# Patient Record
Sex: Female | Born: 1960 | Race: Black or African American | Hispanic: No | Marital: Single | Smoking: Current some day smoker
Health system: Southern US, Community
[De-identification: ages and names within clinical notes are randomized; demographics above are authoritative.]

## PROBLEM LIST (undated history)

## (undated) DIAGNOSIS — K5900 Constipation, unspecified: Secondary | ICD-10-CM

## (undated) DIAGNOSIS — R112 Nausea with vomiting, unspecified: Secondary | ICD-10-CM

## (undated) DIAGNOSIS — E1110 Type 2 diabetes mellitus with ketoacidosis without coma: Secondary | ICD-10-CM

## (undated) DIAGNOSIS — G9332 Myalgic encephalomyelitis/chronic fatigue syndrome: Secondary | ICD-10-CM

## (undated) DIAGNOSIS — M545 Low back pain, unspecified: Secondary | ICD-10-CM

## (undated) DIAGNOSIS — J189 Pneumonia, unspecified organism: Secondary | ICD-10-CM

## (undated) DIAGNOSIS — R519 Headache, unspecified: Secondary | ICD-10-CM

## (undated) DIAGNOSIS — J45909 Unspecified asthma, uncomplicated: Secondary | ICD-10-CM

## (undated) DIAGNOSIS — F419 Anxiety disorder, unspecified: Secondary | ICD-10-CM

## (undated) DIAGNOSIS — D219 Benign neoplasm of connective and other soft tissue, unspecified: Secondary | ICD-10-CM

## (undated) DIAGNOSIS — Z9889 Other specified postprocedural states: Secondary | ICD-10-CM

## (undated) DIAGNOSIS — N289 Disorder of kidney and ureter, unspecified: Secondary | ICD-10-CM

## (undated) DIAGNOSIS — K219 Gastro-esophageal reflux disease without esophagitis: Secondary | ICD-10-CM

## (undated) DIAGNOSIS — R102 Pelvic and perineal pain unspecified side: Secondary | ICD-10-CM

## (undated) DIAGNOSIS — I10 Essential (primary) hypertension: Secondary | ICD-10-CM

## (undated) DIAGNOSIS — M797 Fibromyalgia: Secondary | ICD-10-CM

## (undated) DIAGNOSIS — M549 Dorsalgia, unspecified: Secondary | ICD-10-CM

## (undated) DIAGNOSIS — K589 Irritable bowel syndrome without diarrhea: Secondary | ICD-10-CM

## (undated) DIAGNOSIS — M069 Rheumatoid arthritis, unspecified: Secondary | ICD-10-CM

## (undated) DIAGNOSIS — N183 Chronic kidney disease, stage 3 unspecified: Secondary | ICD-10-CM

## (undated) DIAGNOSIS — E739 Lactose intolerance, unspecified: Secondary | ICD-10-CM

## (undated) DIAGNOSIS — E119 Type 2 diabetes mellitus without complications: Secondary | ICD-10-CM

## (undated) DIAGNOSIS — G8929 Other chronic pain: Secondary | ICD-10-CM

## (undated) DIAGNOSIS — R51 Headache: Secondary | ICD-10-CM

## (undated) HISTORY — DX: Lactose intolerance, unspecified: E73.9

## (undated) HISTORY — DX: Type 2 diabetes mellitus without complications: E11.9

## (undated) HISTORY — PX: BREAST BIOPSY: SHX20

## (undated) HISTORY — DX: Pelvic and perineal pain unspecified side: R10.20

## (undated) HISTORY — DX: Rheumatoid arthritis, unspecified: M06.9

## (undated) HISTORY — DX: Constipation, unspecified: K59.00

## (undated) HISTORY — DX: Dorsalgia, unspecified: M54.9

## (undated) HISTORY — DX: Myalgic encephalomyelitis/chronic fatigue syndrome: G93.32

## (undated) HISTORY — DX: Irritable bowel syndrome, unspecified: K58.9

## (undated) HISTORY — DX: Benign neoplasm of connective and other soft tissue, unspecified: D21.9

## (undated) HISTORY — PX: BREAST EXCISIONAL BIOPSY: SUR124

## (undated) HISTORY — DX: Pelvic and perineal pain: R10.2

---

## 1998-10-06 ENCOUNTER — Ambulatory Visit (HOSPITAL_COMMUNITY): Admission: RE | Admit: 1998-10-06 | Discharge: 1998-10-06 | Payer: Self-pay | Admitting: Internal Medicine

## 1999-08-12 ENCOUNTER — Emergency Department (HOSPITAL_COMMUNITY): Admission: EM | Admit: 1999-08-12 | Discharge: 1999-08-12 | Payer: Self-pay | Admitting: Emergency Medicine

## 2000-03-29 ENCOUNTER — Other Ambulatory Visit: Admission: RE | Admit: 2000-03-29 | Discharge: 2000-03-29 | Payer: Self-pay | Admitting: Obstetrics & Gynecology

## 2001-10-05 ENCOUNTER — Other Ambulatory Visit: Admission: RE | Admit: 2001-10-05 | Discharge: 2001-10-05 | Payer: Self-pay | Admitting: Obstetrics and Gynecology

## 2001-10-09 ENCOUNTER — Encounter: Admission: RE | Admit: 2001-10-09 | Discharge: 2001-10-09 | Payer: Self-pay | Admitting: Obstetrics and Gynecology

## 2001-10-09 ENCOUNTER — Encounter: Payer: Self-pay | Admitting: Obstetrics and Gynecology

## 2001-11-14 ENCOUNTER — Ambulatory Visit (HOSPITAL_COMMUNITY): Admission: RE | Admit: 2001-11-14 | Discharge: 2001-11-14 | Payer: Self-pay | Admitting: Obstetrics & Gynecology

## 2001-11-14 ENCOUNTER — Encounter: Payer: Self-pay | Admitting: Obstetrics and Gynecology

## 2002-07-25 ENCOUNTER — Inpatient Hospital Stay (HOSPITAL_COMMUNITY): Admission: AD | Admit: 2002-07-25 | Discharge: 2002-07-27 | Payer: Self-pay | Admitting: Internal Medicine

## 2002-07-26 ENCOUNTER — Encounter: Payer: Self-pay | Admitting: Internal Medicine

## 2002-07-26 ENCOUNTER — Encounter (INDEPENDENT_AMBULATORY_CARE_PROVIDER_SITE_OTHER): Payer: Self-pay | Admitting: Cardiology

## 2002-07-27 ENCOUNTER — Encounter: Payer: Self-pay | Admitting: Internal Medicine

## 2003-04-25 ENCOUNTER — Other Ambulatory Visit: Admission: RE | Admit: 2003-04-25 | Discharge: 2003-04-25 | Payer: Self-pay | Admitting: Obstetrics and Gynecology

## 2003-05-09 ENCOUNTER — Encounter: Payer: Self-pay | Admitting: Obstetrics and Gynecology

## 2003-05-09 ENCOUNTER — Encounter: Admission: RE | Admit: 2003-05-09 | Discharge: 2003-05-09 | Payer: Self-pay | Admitting: Obstetrics and Gynecology

## 2004-05-10 ENCOUNTER — Other Ambulatory Visit: Admission: RE | Admit: 2004-05-10 | Discharge: 2004-05-10 | Payer: Self-pay | Admitting: Obstetrics and Gynecology

## 2004-05-21 ENCOUNTER — Encounter: Admission: RE | Admit: 2004-05-21 | Discharge: 2004-05-21 | Payer: Self-pay | Admitting: Obstetrics and Gynecology

## 2004-06-11 ENCOUNTER — Ambulatory Visit (HOSPITAL_COMMUNITY): Admission: RE | Admit: 2004-06-11 | Discharge: 2004-06-11 | Payer: Self-pay | Admitting: Cardiology

## 2005-06-08 ENCOUNTER — Other Ambulatory Visit: Admission: RE | Admit: 2005-06-08 | Discharge: 2005-06-08 | Payer: Self-pay | Admitting: Obstetrics and Gynecology

## 2005-06-24 ENCOUNTER — Encounter: Admission: RE | Admit: 2005-06-24 | Discharge: 2005-06-24 | Payer: Self-pay | Admitting: Obstetrics and Gynecology

## 2006-06-23 ENCOUNTER — Other Ambulatory Visit: Admission: RE | Admit: 2006-06-23 | Discharge: 2006-06-23 | Payer: Self-pay | Admitting: Obstetrics and Gynecology

## 2006-07-07 ENCOUNTER — Encounter: Admission: RE | Admit: 2006-07-07 | Discharge: 2006-07-07 | Payer: Self-pay | Admitting: Obstetrics and Gynecology

## 2006-07-18 ENCOUNTER — Encounter: Admission: RE | Admit: 2006-07-18 | Discharge: 2006-07-18 | Payer: Self-pay | Admitting: Obstetrics and Gynecology

## 2006-07-18 ENCOUNTER — Encounter (INDEPENDENT_AMBULATORY_CARE_PROVIDER_SITE_OTHER): Payer: Self-pay | Admitting: Specialist

## 2006-12-26 HISTORY — PX: DILATION AND CURETTAGE OF UTERUS: SHX78

## 2007-01-18 ENCOUNTER — Encounter: Admission: RE | Admit: 2007-01-18 | Discharge: 2007-01-18 | Payer: Self-pay | Admitting: Obstetrics and Gynecology

## 2007-03-29 ENCOUNTER — Encounter: Admission: RE | Admit: 2007-03-29 | Discharge: 2007-03-29 | Payer: Self-pay | Admitting: Rheumatology

## 2007-07-20 ENCOUNTER — Encounter: Admission: RE | Admit: 2007-07-20 | Discharge: 2007-07-20 | Payer: Self-pay | Admitting: Obstetrics and Gynecology

## 2007-09-28 ENCOUNTER — Encounter (INDEPENDENT_AMBULATORY_CARE_PROVIDER_SITE_OTHER): Payer: Self-pay | Admitting: Obstetrics and Gynecology

## 2007-09-28 ENCOUNTER — Ambulatory Visit (HOSPITAL_COMMUNITY): Admission: RE | Admit: 2007-09-28 | Discharge: 2007-09-28 | Payer: Self-pay | Admitting: Obstetrics and Gynecology

## 2008-07-25 ENCOUNTER — Encounter: Admission: RE | Admit: 2008-07-25 | Discharge: 2008-07-25 | Payer: Self-pay | Admitting: Obstetrics and Gynecology

## 2008-09-12 ENCOUNTER — Emergency Department (HOSPITAL_COMMUNITY): Admission: EM | Admit: 2008-09-12 | Discharge: 2008-09-12 | Payer: Self-pay | Admitting: Emergency Medicine

## 2009-07-31 ENCOUNTER — Encounter: Admission: RE | Admit: 2009-07-31 | Discharge: 2009-07-31 | Payer: Self-pay | Admitting: Obstetrics and Gynecology

## 2009-09-11 ENCOUNTER — Encounter: Admission: RE | Admit: 2009-09-11 | Discharge: 2009-09-11 | Payer: Self-pay | Admitting: Internal Medicine

## 2010-08-13 ENCOUNTER — Encounter: Admission: RE | Admit: 2010-08-13 | Discharge: 2010-08-13 | Payer: Self-pay | Admitting: Obstetrics and Gynecology

## 2011-05-10 NOTE — Discharge Summary (Signed)
NAME:  Maria [name], Maria [name]            ACCOUNT NO.:  [id number]   MEDICAL RECORD NO.:  [id number]          PATIENT TYPE:  AMB   LOCATION:  SDC                           FACILITY:  [facility]   PHYSICIAN:  Naima A. Dillard, M.D. DATE OF BIRTH:  Mar 29, 1961   DATE OF ADMISSION:  DATE OF DISCHARGE:                               DISCHARGE SUMMARY   CHIEF COMPLAINT:  Menometrorrhagia.   Patient is a 50 year old African-American female who presented back in  June, 2008 complaining of heavy irregular cycles.  Patient states that  she had bleeding for about 7-8 days using a pad about every hour or hour  and a half.  Urine pregnancy test, GC/Chlamydia was negative.  Patient  had an ultrasound which showed her uterus to be 9 weeks size with three  fibroids, subserosal on the left, 3 cm in size.  This later resolved in  August, and then the patient had a period in August which stayed on  continuously in which she had bleeding in which she soaked two pads a  day.  We did try to stop the bleeding with Provera and was helpful.  The  patient continued to bleed and started to have bloating and weight gain  from it.  Then put her on an OCP pill type of taper.  This did stop the  bleeding.  The patient wants a D&C, hysteroscopy, possible resection.  All treatments for fibroids were reviewed with the patient.  The patient  has decided on this.  She does want to retain her fertility if able.  She was seen by a fertility who says she would need an egg donor if she  would want IUP.  As per the patient, declined that, but she does not  want to remain on birth control and does want a D&C.   Patient wants no known drug allergies.   PAST MEDICAL HISTORY:  Significant for anxiety, asthma, acid reflux,  hypertension.   Medications include fluconazole, alprazolam, Flovent, omeprazole,  Cartia, triamterene, and vitamins.   PAST SURGICAL HISTORY:  Unremarkable.   SOCIAL HISTORY:  Significant for smoking a cigarette a  day.  Occasional  alcohol.  No illicit drug use.   PHYSICAL EXAMINATION:  Patient's blood pressure is 100/80.  Weight is  166 pounds.  Pupils are equal.  Hearing is normal.  Thyroid is not enlarged.  HEART:  Regular rate and rhythm.  LUNGS:  Clear to auscultation bilaterally.  BREASTS:  No masses, discharge, skin change, or nipple retraction  bilaterally.  BACK:  No CVA tenderness.  ABDOMEN:  Nontender without any masses or organomegaly.  EXTREMITIES:  No clubbing, cyanosis or edema.  Full vaginal exam is within normal limits.  Cervix is nontender without  any lesions.  Uterus is normal in shape, size, and consistency and  nontender.  Adnexa has no masses.   Urine pregnancy test is negative.  Hemoglobin is found to be 13.8.   ASSESSMENT:  Fibroids, menometrorrhagia.  All treatments were reviewed  for fibroids, which includes but not limited to observation, birth  control, D&C, myomectomy, Lupron, and hysterectomy.  Patient desires to  obtain her fertility and with a D&C hysteroscopy.  She understands the  risk of but not limited to bleeding, infection, perforation of the  uterus, Asherman's syndrome, which could lead to infertility.  Patient  agrees and wants to proceed.      Naima A. Normand Sloop, M.D.  Electronically Signed     NAD/MEDQ  D:  09/27/2007  T:  09/27/2007  Job:  [id number]

## 2011-05-13 NOTE — H&P (Signed)
NAME:  Maria [name], Maria [name]                      ACCOUNT NO.:  [id number]   MEDICAL RECORD NO.:  [id number]                   PATIENT TYPE:  INP   LOCATION:  3728                                 FACILITY:  [facility]   PHYSICIAN:  Eric L. August Saucer, M.D.                  DATE OF BIRTH:  20-Jun-1961   DATE OF ADMISSION:  07/25/2002  DATE OF DISCHARGE:  07/27/2002                                HISTORY & PHYSICAL   CHIEF COMPLAINT:  Increasing shortness of breath and cardiac arrhythmia.   HISTORY OF PRESENT ILLNESS:  This is the first recent [facility]  admission for this 50 year old single black female with [name] long history of  hypertension, asthma, and allergic rhinitis.  For the past week the patient  has had problems with increasing shortness of breath with intermittent  asthma.  She had had [name] nonproductive cough as well.  No associated chest  pain.  She recently was started on [name] prednisone dosepak after not responding  to her bronchodilators and antibiotics.  Last night she developed problems  with rapid heart rate.  States this was preceded by transient sharp chest  pain.  She also experienced some heartburn during the nighttime as well.  The patient was seen in our office today and noted to have [name] sinus  tachycardia with occasional ectopic beats.  EKG demonstrated [name] sinus  tachycardia without other abnormalities.  The patient was advised to stop  Advair, which she had already taken.  Her albuterol inhaler was held as  well.  She was given [name] low-dose Toprol XL.  She, however, had no improvement  of  her tachycardia.  She experienced transient heaviness in the chest as  well.  She was subsequently admitted for evaluation and observation.  She  has continued to have intermittent cough, presently nonproductive.  Denies  fever or chills.  Occasional night sweats.   FAMILY HISTORY:  Remarkable for hypertension and heart disease in her  mother, who has presently passed.   SOCIAL HISTORY:   The patient's habits are remarkable for smoking two to  three cigarettes [name] day.  Occasional alcohol on weekends.  She is single.  She does work in [name] [employer].  This has exacerbated her  asthma.   PAST SURGICAL HISTORY:  Patient with no previous surgical history.   REVIEW OF SYSTEMS:  As noted above.  She does acknowledge occasional  heartburn.  She has had some postnasal drainage.  Admits to [name] cough that  does wake her up during the night.   ALLERGIES:  The patient has no medication allergies.  She does have eczema  with periodic exacerbation.   MEDICATIONS:  Zyrtec 10 mg q.d., Maxzide 25 mg q.d., Cardizem LA 180 mg p.o.  q.d., Levaquin 500 mg p.o. q.d., Advair 100/50 one puff b.i.d. (it is  recorded that she was taking two puffs b.i.d.), prednisone dosepak which is  presently  held, Proventil two puffs q.i.d. p.r.n.  On review, she had taken  Proventil up to six times per day.   PHYSICAL EXAMINATION:  GENERAL:  She is [name] well-developed, well-nourished,  anxious black female in no acute distress.   VITAL SIGNS:  Blood pressure of 138/84, temperature 97.8, pulse of 94-100,  respiratory rate 20.   HEENT:  Head:  Normocephalic, atraumatic.  She has [name] left frontal sinus  tenderness.  No maxillary sinus tenderness.  Nose:  Mild left turbinate  edema versus right.  Oropharynx clear.  The TMs with decreased light reflex,  without erythematous changes.   NECK:  No palpable cervical nodes.   CHEST:  Lungs with slightly diminished breath sounds but no audible wheezes  or rales.  No E to [name] changes are appreciated except for minimal change in  the right base.   CARDIAC:  She has [name] rapid rhythm.  Occasional ectopic beat noted.  Normal  S1, S2.  No S3, murmurs, or rubs appreciated.   ABDOMEN:  Bowel sounds present.  No enlarged liver or spleen, masses, or  tenderness.   MUSCULOSKELETAL:  She has [name] small bruise on the left inner thigh, nontender.  Minimal crepitus in the  knees.  Negative Homans.  No edema.   NEUROLOGIC:  Alert and oriented x3.  Cranial nerves were intact.  Cerebellar, sensory, and motor function were intact.   LABORATORY DATA:  EKG notable for sinus rhythm, presently with [name] rate of 86.  Normal axis.  Poor R-wave progression.  No acute changes.  She had voltage  criteria for left ventricular hypertrophy.  Chest x-ray pending.   CBC revealed WBC of 17,300, hemoglobin 12.9, hematocrit 39.1, adequate  platelets.  Polys 83.  Chemistry:  Sodium 134, potassium 3.6, chloride 105,  CO2 21, BUN 18, creatinine 1.2, glucose elevated at 151.  Albumin 3.6, total  protein 7.2.  CK is 125, MB 1.3, troponin 0.01.   IMPRESSION:  1. Acute asthma with persistent shortness of breath and atypical cough.  2. Cardiac arrhythmia with transient sinus tachycardia, quite symptomatic.  3. Anxiety disorder associated with [name] drug, rule out other.  4. Hypertension.  5. Allergic rhinitis.  6. Rule out reflux with associated cough.   PLAN:  The patient is admitted for observation on telemetry at this time.  We will control rate with Cardizem and, if necessary, low-dose beta blocker.  This has not been demonstrated to exacerbate her asthma.  Will obtain [name] 2 D  echo to evaluate LV function and rule out mitral valve prolapse versus  other.  Monitor her telemetry during the night.  Further therapy pending  results of the above.  Will also check [name] thyroid panel at this time.  Will  hold albuterol and any other stimulant agent, i.e., Serevent.  Will place  her on Atrovent nebulizer q.i.d.  Will continue AeroBid inhaler and hold  Advair at this time.  Further evaluation in 24 hours.  Cardiologic  evaluation pending the results of the above.  She will also need [name] barium  swallow, which can be done as an outpatient.                                                Eric L. August Saucer, M.D.    ELD/MEDQ  D:  07/25/2002  T:  07/31/2002  Job:  [id number]

## 2011-05-13 NOTE — Discharge Summary (Signed)
NAME:  Maria [name], Maria [name] NO.:  [id number]   MEDICAL RECORD NO.:  [id number]                    PATIENT TYPE:   LOCATION:                                       FACILITY:   PHYSICIAN:  Eric L. August Saucer, M.D.                  DATE OF BIRTH:  [birth year]   DATE OF ADMISSION:  07/25/2002  DATE OF DISCHARGE:  07/27/2002                                 DISCHARGE SUMMARY   FINAL DIAGNOSES:  1. Tachycardia (785.0).  2. Adverse effect of medication (E947.9).  3. Asthma without status asthmaticus (493.90).  4. Hypertension (401.9).  5. 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  6. 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  7. Anxiety state (300.00).   OPERATIONS AND PROCEDURES:  None.   HISTORY OF PRESENT ILLNESS:  This is the first recent [place] admission  for this 50 year old single black female with a long history of  hypertension, asthma, and allergic rhinitis.  For the past week, the patient  had had problems with increasing shortness of breath with intermittent  asthma.  She had a nonproductive cough as well without associated chest  pain.  She has recently started on prednisone Dosepak after not responding  to her bronchodilators and antibiotics.  One night prior to admission, she  experienced a rapid heart rate.  This was proceeded by a transient sharp  chest pain.  She also experienced some heartburn during the nighttime as  well.  The patient was subsequently seen in the office the following day and  noted to have a sinus tachycardia with occasional ectopic beats.  EKG  demonstrated a sinus tachycardia without other abnormalities.  She was  advised to stop the Advair, which she had already taken.  Her albuterol was  held as well.  The patient was given low-dose Toprol.  Despite this,  however, her symptoms did not improve and she subsequently experienced some  transient heaviness in her chest.  The patient was  admitted for further  evaluation and therapy.   PAST MEDICAL HISTORY:  As per admission H&P.   PHYSICAL EXAMINATION:  As per admission H&P.   HOSPITAL COURSE:  The patient was admitted for further evaluation of acute  asthma with associated tachycardia.  She was noted at time of presentation  to have a normal CK-MB.  Blood sugar was mildly elevated at 151.  The  patient was placed on telemetry.  She started IV fluids.  She was started on  Cardizem for control of her heart rate without exacerbation of her asthma.  The patient's bronchodilators were held including the albuterol and Serevent  which she had been taking, as it was felt this was aggravating her  tachycardia.  She was placed on Atrovent nebulizer q.i.d.  She was continued  on AeroBid inhaler for steroid adjuvant.   Over the subsequent 24 hours, the patient's heart rate  did gradually  subside.  She had no significant palpations on the subsequent day.  Her  lungs had improved considerably as well.  Notably, a CRP was 0.3.  HDL  cholesterol was measured at 75 and total cholesterol was 207.  She was  continued on her medication thereafter.  She was seen in consultation by Dr.  Algie Coffer and Dr. Sharyn Lull for cardiology clearance.  It was felt that she was  indeed experiencing atypical chest pain.  She subsequently underwent a  Cardiolite study to exclude underlying coronary artery disease.  This was  subsequently found to be negative.  She had an ejection fraction of  approximately 66%.   The patient also clearly during her hospital stay had significant anxiety  disorder.  This was discussed as well with appropriate medication given.  She also had signs of a maxillary sinusitis and this was treated with  antibiotics as well.  By July 27, 2002, she was feeling considerably better  and felt to be stable for discharge.   DISCHARGE MEDICATIONS:  1. Cardizem LA 180 mg q.d.  2. AeroBid 2 puffs b.i.d.  3. Levaquin 500 mg q.d. for  seven days.  4. Atrovent metered dose inhaler 2 puffs q.i.d.  5. Zyrtec 10 mg q.d.  6. Protonix 40 mg q.a.m.  7. _________ 25 mg q.d.  8. Muco-Fen-DM 1 p.o. b.i.d.   DISCHARGE INSTRUCTIONS:  She needs to avoid smoking and dust.  No added salt  or sweets to her diet.  Follow up in the office in two weeks' time.                                                  Eric L. August Saucer, M.D.    ELD/MEDQ  D:  10/09/2002  T:  10/11/2002  Job:  [id number]

## 2011-05-13 NOTE — Discharge Summary (Signed)
NAME:  Maria Maria [name], Maria Maria [name]                      ACCOUNT NO.:  [id number]   MEDICAL RECORD NO.:  [id number]                   PATIENT TYPE:  INP   LOCATION:  3728                                 FACILITY:  [facility]   PHYSICIAN:  Eric L. August Saucer, M.D.                  DATE OF BIRTH:  17-Aug-1961   DATE OF ADMISSION:  07/25/2002  DATE OF DISCHARGE:  07/27/2002                                 DISCHARGE SUMMARY   FINAL DIAGNOSES:  1. Tachycardia, 785.0  2. Adverse effect of medication, E947.9  3. Asthma without status asthmaticus, 493.90  4. Hypertension, 401.9.  5. Glucose intolerance, 271.3.  6. Chronic maxillary sinusitis, 473.0.  7. Anxiety disorder.   OPERATION/PROCEDURE:  None.   HISTORY OF PRESENT ILLNESS:  This is the first recent [facility]  admission for this 50 year old single black female with Maria [name] long history of  hypertension, asthma and allergic rhinitis. For the past week, the patient  had had problems with increasing shortness of breath with intermittent flare  of his asthma. She had had Maria [name] nonproductive cough as well without associated  chest pain. The patient was recently started Maria [name] prednisone dose pack after  not responding to her bronchodilators and Advair as well as antibiotics. The  night prior to admission, she developed rapid heart rate. This was proceeded  by Maria [name] transient sharp chest pain. She did experience some heartburn during  this episode as well. The patient  was seen in the office subsequently and  was noted to have Maria [name] sinus tachycardia with occasional ectopic beats. This  was confirmed by EKG. She subsequently was advised to stop Advair which she  had already taken for that morning. The albuterol inhaler was held as well.  The patient was started on low dose Toprol. Despite this, however, she had  no improvement of her tachycardia. She subsequently experienced strain and  heaviness in her chest. The patient was admitted for further evaluation and  stabilization thereafter.   Past medical history and physical exam is per admission H&P.   HOSPITAL COURSE:  The patient was admitted for further evaluation and  treatment of her tachycardia as well as her asthma. She was placed on  telemetry initially. Cardiac enzymes and thyroid function test were obtained  which were within normal limits. Her tachycardia was controlled acutely with  Cardizem. She was placed on Atrovent nebulizer therapy to treat her asthma  which did not cause any poor arrhythmic effect. Over the subsequent day, the  patient  felt considerably better. She was seen in consultation by Dr.  Sharyn Lull of cardiology. In view of her substernal pressure pain and family  history, it was felt that Maria [name] stress Cardiolite would be most appropriate to  exclude coronary artery disease. This was subsequently performed which she  tolerated well. This subsequently was found to be negative for ischemia with  good ejection fraction of 60%. Note, the other  laboratory data obtained  during this time was significant for hemoglobin A1c being 7. She was  subsequently advised on dietary measures as well.  By 07/27/02, the patient  was feeling considerably better. She was felt to be stable for discharge.  Healthy habits were advised.   MEDICATIONS AT TIME OF DISCHARGE::  1. Cardizem LA 180 mg q.d.  2. AeroBid two puffs b.i.d.  3. Tequin 400 mg q.d. for sinusitis.  4. Atrovent MDI two puffs q.i.d.  5. Zyrtec 10 mg q.d.  6. Protonix 40 mg q.Maria [name].m.  7. Maxzide 25 mg q.d.  8. Muco-Fen-DM 600 mg one p.o. b.i.d.   She was advised to avoid her obvious irritating factors, i.e., smoke and  dust. No added salt or sweets to her diet. Follow up in two weeks time.                                               Eric L. August Saucer, M.D.    ELD/MEDQ  D:  09/11/2002  T:  09/14/2002  Job:  [id number]

## 2011-05-13 NOTE — Op Note (Signed)
NAME:  Paredez, Takeria            ACCOUNT NO.:  [id number]   MEDICAL RECORD NO.:  [id number]          PATIENT TYPE:  AMB   LOCATION:  SDC                           FACILITY:  [facility]   PHYSICIAN:  Naima A. Dillard, M.D. DATE OF BIRTH:  1961/06/20   DATE OF PROCEDURE:  10/25/2007  DATE OF DISCHARGE:                               OPERATIVE REPORT   PREOPERATIVE DIAGNOSIS:  Menometrorrhagia.   POSTOPERATIVE DIAGNOSIS:  Menometrorrhagia with endometrial polyp.   PROCEDURE:  1. Dilatation and curettage.  2. Hysteroscopy.  3. Polypectomy.   SURGEON:  Naima A. Dillard, M.D.   ASSISTANT:  There were no assistants.   ANESTHESIA:  General and local.   SPECIMENS:  Small polyp near the patient's right ostium.  There were no  submucosal fibroids noted, but the endometrium appeared to be atrophic,  no other masses except for the small polyp.  Endometrial curettings and  endometrial polyp were sent to Pathology.   ESTIMATED BLOOD LOSS:  Minimal.   COMPLICATIONS:  There were no complications.   DISPOSITION:  The patient went to recovery room in stable condition.   PROCEDURE IN DETAIL:  The patient was taken to the operating room, where  she was the placed in the dorsal lithotomy position and prepped and  draped in a normal sterile fashion and given general anesthesia and her  bladder was drained with a straight catheter.  A bivalve speculum was  placed into the vagina.  The anterior lip of the cervix was grasped with  a single-tooth tenaculum.  The uterus did sound to 8 cm.  The cervix was  infiltrated with 1% lidocaine, 20 mL, for a cervical block.  The cervix  was then dilated with [facility] dilators.  A hysteroscope was placed into  the uterine cavity; both ostia were visualized.  There was atrophic  endometrium.  The endocervix was normal.  The fundus was normal.  There  were no submucosal fibroids.  Just right outside the right ostia was a  small polyp which was removed with the polyp  forceps with the  hysteroscope.  Sharp curettage was done and endometrial  curettings were obtained and sent to Pathology.  All instruments were  removed from the vagina.  The tenaculum was removed from the cervix with  hemostasis.  Sponge, lap and needle counts were correct.  The patient  went to recovery room in stable condition.      Naima A. Normand Sloop, M.D.  Electronically Signed     NAD/MEDQ  D:  09/28/2007  T:  09/29/2007  Job:  [id number]

## 2011-08-04 ENCOUNTER — Other Ambulatory Visit: Payer: Self-pay | Admitting: Obstetrics and Gynecology

## 2011-08-04 ENCOUNTER — Emergency Department (HOSPITAL_COMMUNITY)
Admission: EM | Admit: 2011-08-04 | Discharge: 2011-08-04 | Disposition: A | Discharge status: Home / Self Care | Payer: BC Managed Care – PPO | Attending: Emergency Medicine | Admitting: Emergency Medicine

## 2011-08-04 ENCOUNTER — Emergency Department (HOSPITAL_COMMUNITY): Payer: BC Managed Care – PPO

## 2011-08-04 DIAGNOSIS — I10 Essential (primary) hypertension: Secondary | ICD-10-CM | POA: Insufficient documentation

## 2011-08-04 DIAGNOSIS — R1032 Left lower quadrant pain: Secondary | ICD-10-CM | POA: Insufficient documentation

## 2011-08-04 DIAGNOSIS — Z1231 Encounter for screening mammogram for malignant neoplasm of breast: Secondary | ICD-10-CM

## 2011-08-04 DIAGNOSIS — D259 Leiomyoma of uterus, unspecified: Secondary | ICD-10-CM | POA: Insufficient documentation

## 2011-08-04 LAB — URINALYSIS, ROUTINE W REFLEX MICROSCOPIC
Bilirubin Urine: NEGATIVE
Hgb urine dipstick: NEGATIVE
Ketones, ur: NEGATIVE mg/dL
Leukocytes, UA: NEGATIVE
Nitrite: NEGATIVE
Protein, ur: NEGATIVE mg/dL
Specific Gravity, Urine: 1.01 (ref 1.005–1.030)
pH: 5.5 (ref 5.0–8.0)

## 2011-08-04 LAB — CBC
Hemoglobin: 14 g/dL (ref 12.0–15.0)
MCH: 31.7 pg (ref 26.0–34.0)
MCHC: 34 g/dL (ref 30.0–36.0)
MCV: 93.4 fL (ref 78.0–100.0)
Platelets: 373 10*3/uL (ref 150–400)
RDW: 14.1 % (ref 11.5–15.5)

## 2011-08-04 LAB — COMPREHENSIVE METABOLIC PANEL
BUN: 18 mg/dL (ref 6–23)
CO2: 22 mEq/L (ref 19–32)
Glucose, Bld: 85 mg/dL (ref 70–99)
Potassium: 4.2 mEq/L (ref 3.5–5.1)
Total Bilirubin: 0.1 mg/dL — ABNORMAL LOW (ref 0.3–1.2)
Total Protein: 7.9 g/dL (ref 6.0–8.3)

## 2011-08-04 LAB — DIFFERENTIAL
Lymphocytes Relative: 25 % (ref 12–46)
Lymphs Abs: 3.1 10*3/uL (ref 0.7–4.0)

## 2011-08-04 LAB — PREGNANCY, URINE: Preg Test, Ur: NEGATIVE

## 2011-08-04 MED ORDER — IOHEXOL 300 MG/ML  SOLN
100.0000 mL | Freq: Once | INTRAMUSCULAR | Status: AC | PRN
  Administered 2011-08-04: 100 mL via INTRAVENOUS

## 2011-08-15 ENCOUNTER — Ambulatory Visit
Admission: RE | Admit: 2011-08-15 | Discharge: 2011-08-15 | Disposition: A | Discharge status: Home / Self Care | Payer: BC Managed Care – PPO | Source: Ambulatory Visit | Attending: Obstetrics and Gynecology | Admitting: Obstetrics and Gynecology

## 2011-08-15 DIAGNOSIS — Z1231 Encounter for screening mammogram for malignant neoplasm of breast: Secondary | ICD-10-CM

## 2011-08-25 ENCOUNTER — Other Ambulatory Visit: Payer: Self-pay | Admitting: Obstetrics and Gynecology

## 2011-08-25 DIAGNOSIS — D259 Leiomyoma of uterus, unspecified: Secondary | ICD-10-CM

## 2011-09-01 ENCOUNTER — Other Ambulatory Visit: Payer: Self-pay | Admitting: Internal Medicine

## 2011-09-01 DIAGNOSIS — R748 Abnormal levels of other serum enzymes: Secondary | ICD-10-CM

## 2011-09-02 ENCOUNTER — Other Ambulatory Visit: Payer: BC Managed Care – PPO

## 2011-09-05 ENCOUNTER — Ambulatory Visit
Admission: RE | Admit: 2011-09-05 | Discharge: 2011-09-05 | Disposition: A | Discharge status: Home / Self Care | Payer: BC Managed Care – PPO | Source: Ambulatory Visit | Attending: Internal Medicine | Admitting: Internal Medicine

## 2011-09-05 DIAGNOSIS — R748 Abnormal levels of other serum enzymes: Secondary | ICD-10-CM

## 2011-09-26 LAB — POCT CARDIAC MARKERS
CKMB, poc: 3.1
CKMB, poc: <1 — ABNORMAL LOW
Myoglobin, poc: 69.1

## 2011-09-26 LAB — DIFFERENTIAL
Basophils Absolute: 0.1
Basophils Relative: 1
Eosinophils Absolute: 0.2
Eosinophils Relative: 2
Lymphs Abs: 3.9
Neutrophils Relative %: 59

## 2011-09-26 LAB — POCT I-STAT, CHEM 8
BUN: 12
Calcium, Ion: 0.96 — ABNORMAL LOW
HCT: 40
Hemoglobin: 13.6
Sodium: 130 — ABNORMAL LOW
TCO2: 21

## 2011-09-26 LAB — CBC
HCT: 38
MCV: 93.8
Platelets: 409 — ABNORMAL HIGH
RDW: 14.6
WBC: 11.5 — ABNORMAL HIGH

## 2011-09-26 LAB — D-DIMER, QUANTITATIVE: D-Dimer, Quant: <0.22

## 2011-10-06 LAB — BASIC METABOLIC PANEL
CO2: 22
Calcium: 9.1
Chloride: 103
Creatinine, Ser: 1.25 — ABNORMAL HIGH
GFR calc Af Amer: 56 — ABNORMAL LOW
Glucose, Bld: 157 — ABNORMAL HIGH

## 2011-10-06 LAB — CBC
HCT: 39.8
Hemoglobin: 13.8
MCHC: 34.6
MCV: 92.9
RBC: 4.28
RDW: 13.7

## 2011-11-07 ENCOUNTER — Other Ambulatory Visit: Payer: Self-pay | Admitting: Internal Medicine

## 2011-11-07 ENCOUNTER — Ambulatory Visit
Admission: RE | Admit: 2011-11-07 | Discharge: 2011-11-07 | Disposition: A | Discharge status: Home / Self Care | Payer: BC Managed Care – PPO | Source: Ambulatory Visit | Attending: Internal Medicine | Admitting: Internal Medicine

## 2011-11-07 DIAGNOSIS — T1490XA Injury, unspecified, initial encounter: Secondary | ICD-10-CM

## 2011-11-23 ENCOUNTER — Other Ambulatory Visit (INDEPENDENT_AMBULATORY_CARE_PROVIDER_SITE_OTHER): Payer: Self-pay | Admitting: Otolaryngology

## 2011-11-23 DIAGNOSIS — J320 Chronic maxillary sinusitis: Secondary | ICD-10-CM

## 2012-01-02 IMAGING — US US RENAL
1 series · 14 of 25 positions shown · non-contrast
Comparison: CT scan 08/04/2011.

CLINICAL DATA: Elevated creatinine.

RENAL/URINARY TRACT ULTRASOUND COMPLETE

[Series 1: us renal · 0.26mm/px · 14 of 44 slices shown]
[im 1/44]
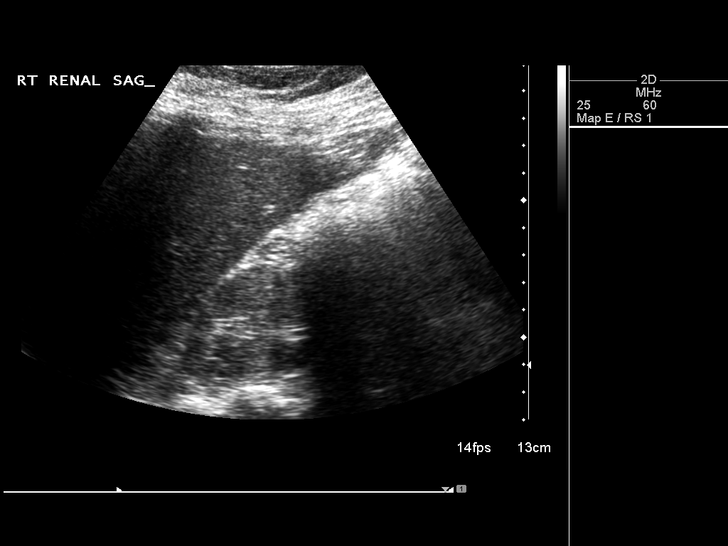
[im 4/44]
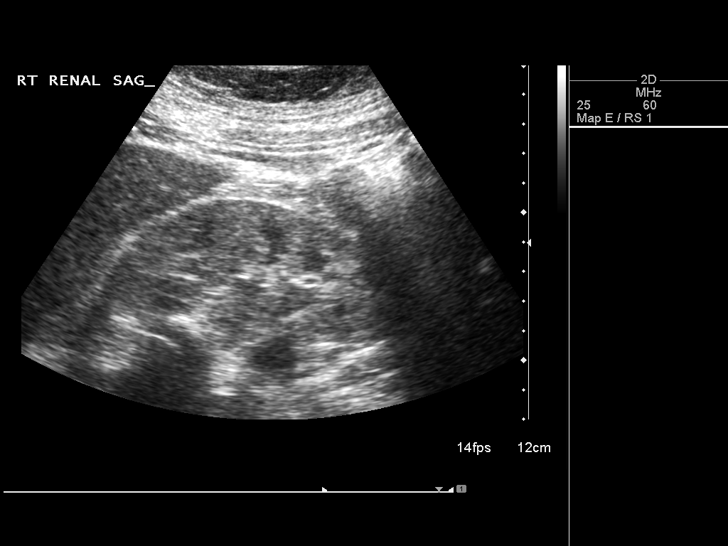
[im 8/44]
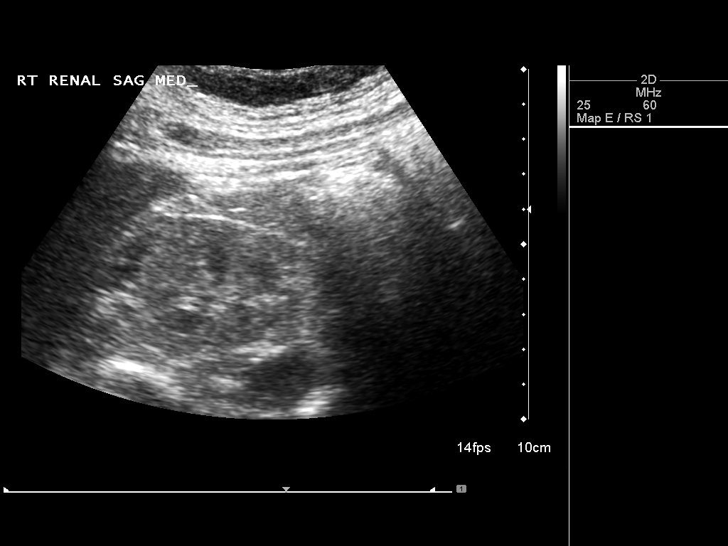
[im 11/44]
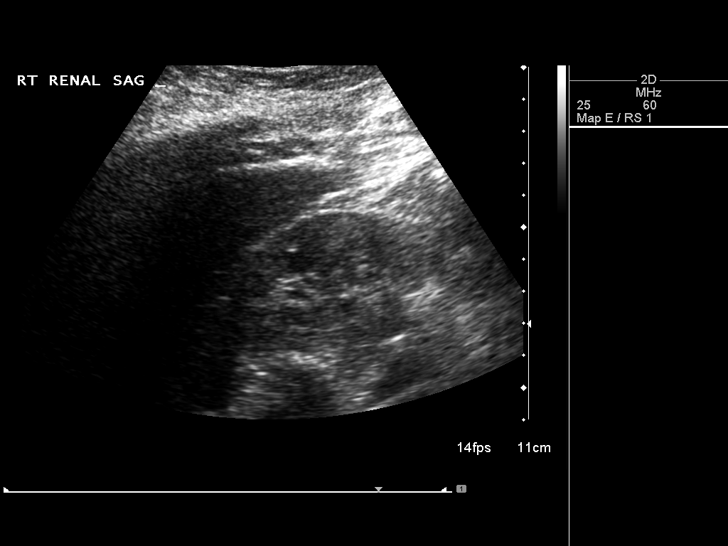
[im 15/44]
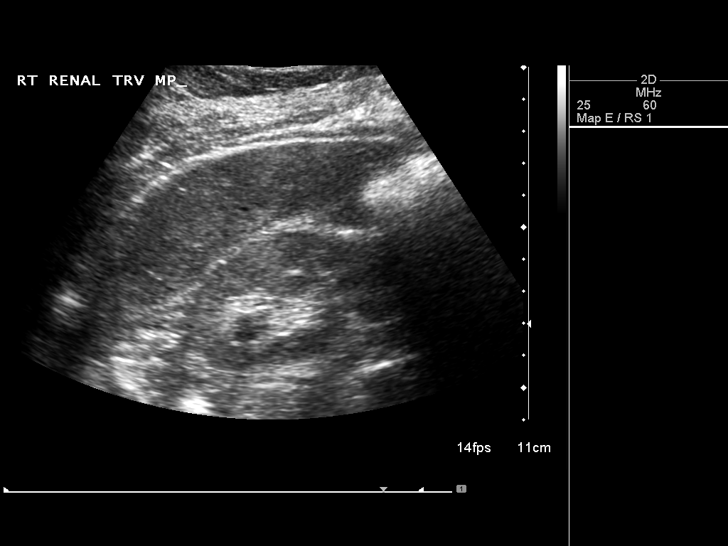
[im 17/44]
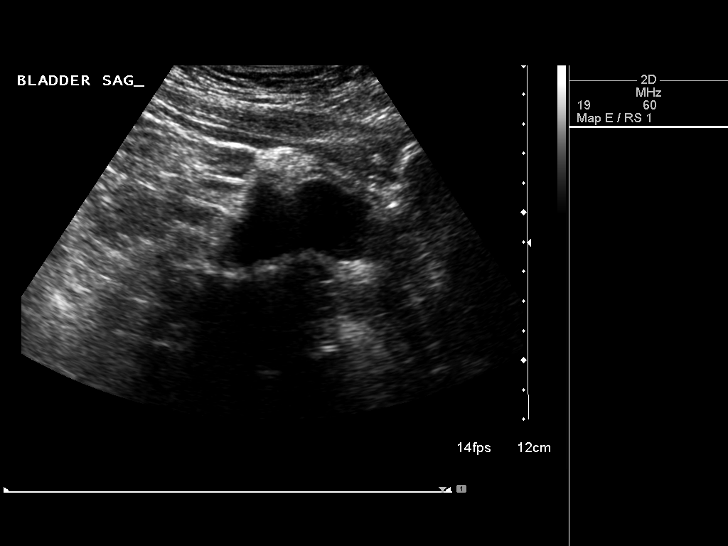
[im 20/44]
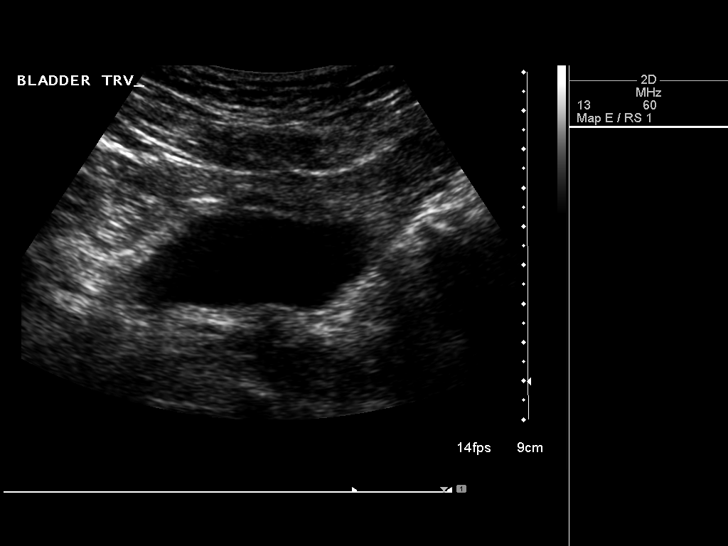
[im 24/44]
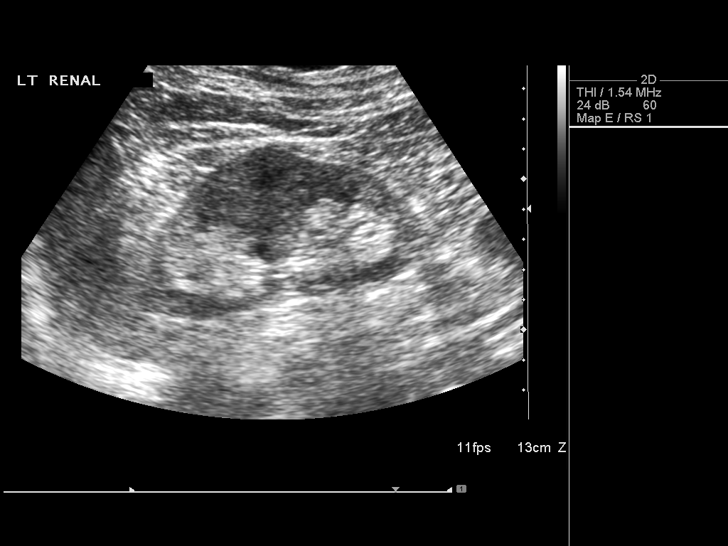
[im 27/44]
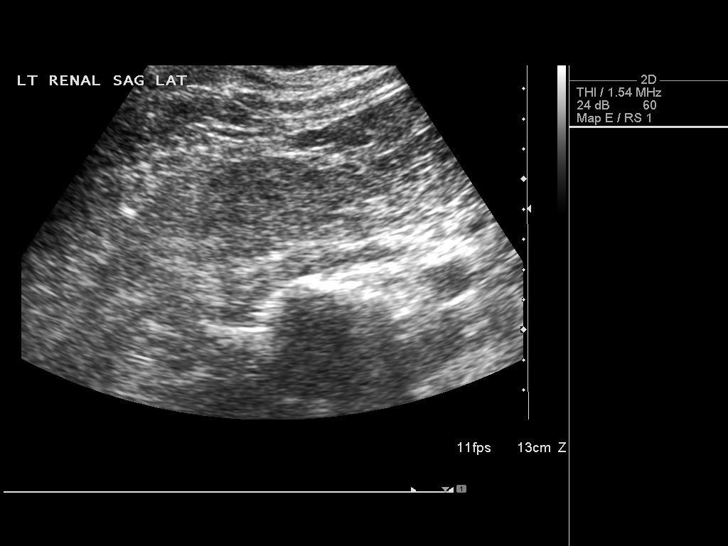
[im 29/44]
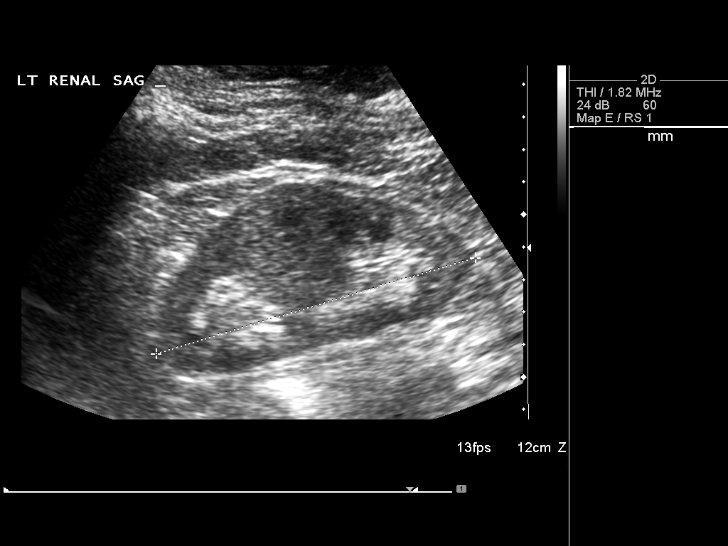
[im 33/44]
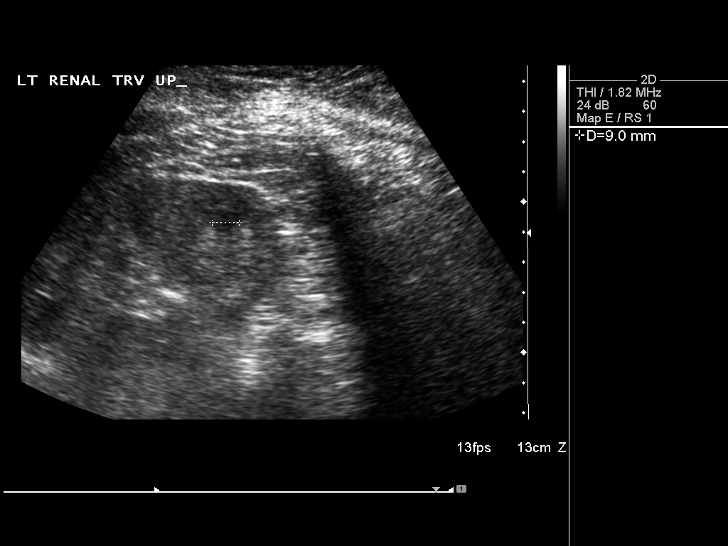
[im 36/44]
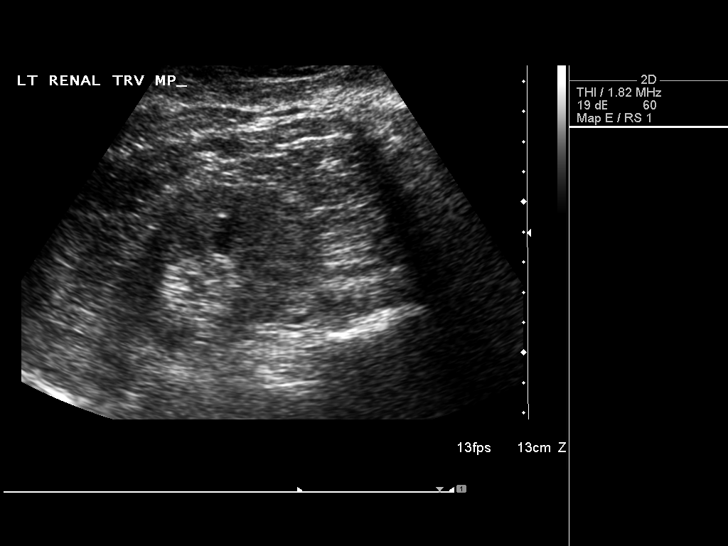
[im 40/44]
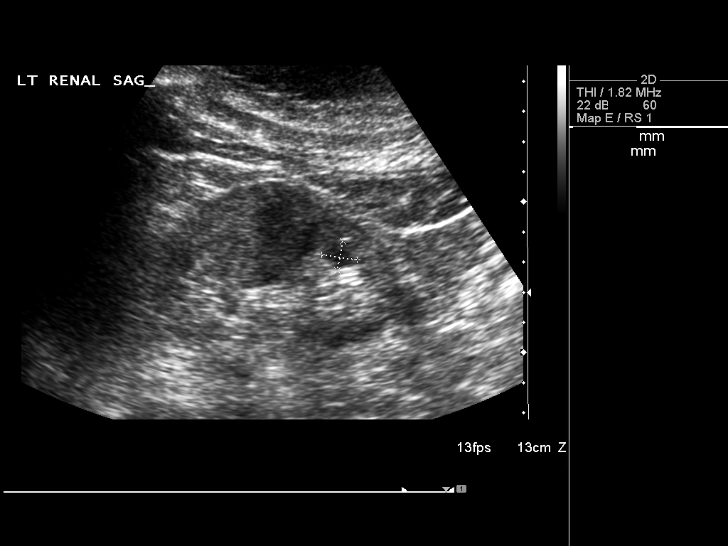
[im 44/44]
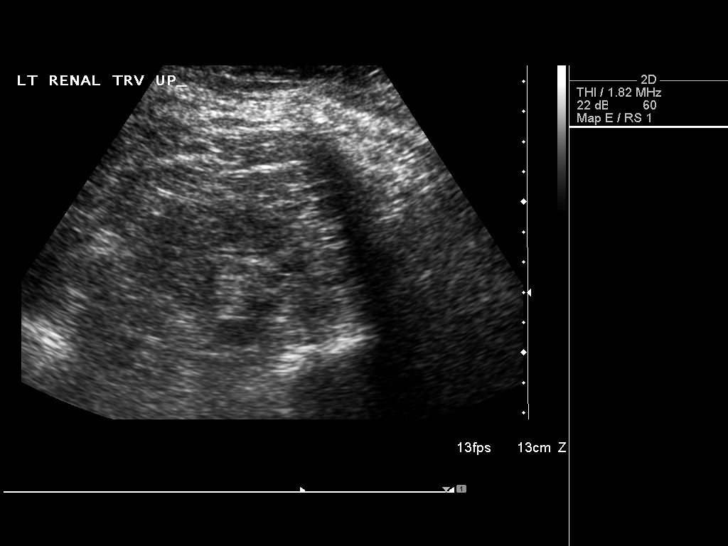

[14 of 25 positions shown; findings below may reference images not displayed]

FINDINGS: Right Kidney:  10.1 cm in length.  Normal renal cortical thickness.
Mild diffuse increased echogenicity suggesting medical renal
disease.  A 1 cm upper pole cyst is noted.  No hydronephrosis.

Left Kidney:  10.3 cm in length.  Increased echogenicity suggesting
medical renal disease.  Normal renal cortical thickness.  Multiple
small cysts are noted.  No hydronephrosis.

Bladder:  Normal
IMPRESSION: 1.  Increased echogenicity of both kidneys suggesting medical renal
disease.
2.  No hydronephrosis.
3.  Small bilateral renal cysts.

## 2012-02-21 ENCOUNTER — Other Ambulatory Visit: Payer: Self-pay | Admitting: Nephrology

## 2012-02-21 DIAGNOSIS — I10 Essential (primary) hypertension: Secondary | ICD-10-CM

## 2012-02-27 ENCOUNTER — Ambulatory Visit
Admission: RE | Admit: 2012-02-27 | Discharge: 2012-02-27 | Disposition: A | Discharge status: Home / Self Care | Payer: BC Managed Care – PPO | Source: Ambulatory Visit | Attending: Nephrology | Admitting: Nephrology

## 2012-02-27 DIAGNOSIS — I10 Essential (primary) hypertension: Secondary | ICD-10-CM

## 2012-06-26 ENCOUNTER — Other Ambulatory Visit: Payer: Self-pay | Admitting: Obstetrics and Gynecology

## 2012-06-26 DIAGNOSIS — Z1231 Encounter for screening mammogram for malignant neoplasm of breast: Secondary | ICD-10-CM

## 2012-08-07 ENCOUNTER — Encounter: Payer: Self-pay | Admitting: Obstetrics and Gynecology

## 2012-08-07 ENCOUNTER — Ambulatory Visit (INDEPENDENT_AMBULATORY_CARE_PROVIDER_SITE_OTHER): Payer: BC Managed Care – PPO | Admitting: Obstetrics and Gynecology

## 2012-08-07 VITALS — BP 136/88 | HR 84 | Ht 65.0 in | Wt 156.0 lb

## 2012-08-07 DIAGNOSIS — Z124 Encounter for screening for malignant neoplasm of cervix: Secondary | ICD-10-CM

## 2012-08-07 DIAGNOSIS — B009 Herpesviral infection, unspecified: Secondary | ICD-10-CM

## 2012-08-07 DIAGNOSIS — D259 Leiomyoma of uterus, unspecified: Secondary | ICD-10-CM

## 2012-08-07 DIAGNOSIS — N289 Disorder of kidney and ureter, unspecified: Secondary | ICD-10-CM

## 2012-08-07 DIAGNOSIS — D219 Benign neoplasm of connective and other soft tissue, unspecified: Secondary | ICD-10-CM | POA: Insufficient documentation

## 2012-08-07 MED ORDER — NORETHIN ACE-ETH ESTRAD-FE 1-20 MG-MCG PO TABS: 1.0000 | ORAL_TABLET | Freq: Every day | ORAL | Status: DC

## 2012-08-07 MED ORDER — VALACYCLOVIR HCL 500 MG PO TABS: 500.0000 mg | ORAL_TABLET | Freq: Two times a day (BID) | ORAL | Status: AC | PRN

## 2012-08-07 NOTE — Progress Notes (Signed)
Last Pap: 07/25/11 WNL: Yes Regular Periods:yes Contraception: pill-junel  Monthly Breast exam:no Tetanus<19yrs:no Nl.Bladder Function:yes Daily BMs:no Healthy Diet:yes Calcium:yes Mammogram:yes Date of Mammogram: 08/15/11 Exercise:no Have often Exercise: n/a Seatbelt: yes Abuse at home: no Stressful work:no Sigmoid-colonoscopy: n/a BP 136/88  Pulse 84  Ht 5\' 5"  (1.651 m)  Wt 156 lb (70.761 kg)  BMI 25.96 kg/m2  LMP 07/16/2012  Bone Density: No PCP: Dr. Minerva Areola L. Dean  Change in PMH: Kidney problems due to hypertension Change in [facility]: none Pt without complaints Physical Examination: General appearance - alert, well appearing, and in no distress Mental status - normal mood, behavior, speech, dress, motor activity, and thought processes Neck - supple, no significant adenopathy, thyroid exam: thyroid is normal in size without nodules or tenderness Chest - clear to auscultation, no wheezes, rales or rhonchi, symmetric air entry Heart - normal rate and regular rhythm Abdomen - soft, nontender, nondistended, no masses or organomegaly Breasts - breasts appear normal, no suspicious masses, no skin or nipple changes or axillary nodes Pelvic - normal external genitalia, vulva, vagina, cervix, uterus 12 week sixe and irregular and adnexa Rectal - normal rectal, no masses, rectal exam not indicated Back exam - full range of motion, no tenderness, palpable spasm or pain on motion Neurological - alert, oriented, normal speech, no focal findings or movement disorder noted Musculoskeletal - no joint tenderness, deformity or swelling Extremities - no edema, redness or tenderness in the calves or thighs Skin - normal coloration and turgor, no rashes, no suspicious skin lesions noted Routine exam Fibroids pt desires observation Herpes pt on valtrex prn Pap sent yes Mammogram due yes pt scheduled for this month pt diagnosed with renal insufficiency from HTN.  she is under the care of a  nephrologist RT 1 yr

## 2012-08-07 NOTE — Patient Instructions (Signed)
Fibroids You have been diagnosed as having a fibroid. Fibroids are smooth muscle lumps (tumors) which can occur any place in a woman's body. They are usually in the womb (uterus). The most common problem (symptom) of fibroids is bleeding. Over time this may cause low red blood cells (anemia). Other symptoms include feelings of pressure and pain in the pelvis. The diagnosis (learning what is wrong) of fibroids is made by physical exam. Sometimes tests such as an ultrasound are used. This is helpful when fibroids are felt around the ovaries and to look for tumors. TREATMENT   Most fibroids do not need surgical or medical treatment. Sometimes a tissue sample (biopsy) of the lining of the uterus is done to rule out cancer. If there is no cancer and only a small amount of bleeding, the problem can be watched.   Hormonal treatment can improve the problem.   When surgery is needed, it can consist of removing the fibroid. Vaginal birth may not be possible after the removal of fibroids. This depends on where they are and the extent of surgery. When pregnancy occurs with fibroids it is usually normal.   Your caregiver can help decide which treatments are best for you.  HOME CARE INSTRUCTIONS   Do not use aspirin as this may increase bleeding problems.   If your periods (menses) are heavy, record the number of pads or tampons used per month. Bring this information to your caregiver. This can help them determine the best treatment for you.  SEEK IMMEDIATE MEDICAL CARE IF:  You have pelvic pain or cramps not controlled with medications, or experience a sudden increase in pain.   You have an increase of pelvic bleeding between and during menses.   You feel lightheaded or have fainting spells.   You develop worsening belly (abdominal) pain.  Document Released: 12/09/2000 Document Revised: 12/01/2011 Document Reviewed: 07/31/2008 Exit[employer] Patient Information 2012 [employer].    

## 2012-08-08 LAB — PAP IG W/ RFLX HPV ASCU

## 2012-08-15 ENCOUNTER — Ambulatory Visit
Admission: RE | Admit: 2012-08-15 | Discharge: 2012-08-15 | Disposition: A | Discharge status: Home / Self Care | Payer: BC Managed Care – PPO | Source: Ambulatory Visit | Attending: Obstetrics and Gynecology | Admitting: Obstetrics and Gynecology

## 2012-08-15 DIAGNOSIS — Z1231 Encounter for screening mammogram for malignant neoplasm of breast: Secondary | ICD-10-CM

## 2012-08-29 ENCOUNTER — Encounter: Payer: Self-pay | Admitting: Obstetrics and Gynecology

## 2013-03-22 ENCOUNTER — Ambulatory Visit (HOSPITAL_BASED_OUTPATIENT_CLINIC_OR_DEPARTMENT_OTHER): Payer: BC Managed Care – PPO | Attending: Internal Medicine | Admitting: Radiology

## 2013-03-22 VITALS — Ht 66.0 in | Wt 165.0 lb

## 2013-03-22 DIAGNOSIS — I10 Essential (primary) hypertension: Secondary | ICD-10-CM | POA: Insufficient documentation

## 2013-03-22 DIAGNOSIS — R0683 Snoring: Secondary | ICD-10-CM

## 2013-03-22 DIAGNOSIS — R5381 Other malaise: Secondary | ICD-10-CM | POA: Insufficient documentation

## 2013-03-22 DIAGNOSIS — R0989 Other specified symptoms and signs involving the circulatory and respiratory systems: Secondary | ICD-10-CM | POA: Insufficient documentation

## 2013-03-22 DIAGNOSIS — G4730 Sleep apnea, unspecified: Secondary | ICD-10-CM | POA: Insufficient documentation

## 2013-03-22 DIAGNOSIS — Z79899 Other long term (current) drug therapy: Secondary | ICD-10-CM | POA: Insufficient documentation

## 2013-03-22 DIAGNOSIS — R0681 Apnea, not elsewhere classified: Secondary | ICD-10-CM

## 2013-03-22 DIAGNOSIS — R51 Headache: Secondary | ICD-10-CM | POA: Insufficient documentation

## 2013-03-22 DIAGNOSIS — R0609 Other forms of dyspnea: Secondary | ICD-10-CM | POA: Insufficient documentation

## 2013-03-23 DIAGNOSIS — R0609 Other forms of dyspnea: Secondary | ICD-10-CM

## 2013-03-23 DIAGNOSIS — G4730 Sleep apnea, unspecified: Secondary | ICD-10-CM

## 2013-03-23 DIAGNOSIS — R0989 Other specified symptoms and signs involving the circulatory and respiratory systems: Secondary | ICD-10-CM

## 2013-04-08 NOTE — Procedures (Signed)
NAME:  Maria [name], Maria [name]            ACCOUNT NO.:  [id number]  MEDICAL RECORD NO.:  [id number]         PATIENT TYPE:  OUT  LOCATION:  SLEEP CENTER                 FACILITY:  [facility]  PHYSICIAN:  Clinton D. Maple Hudson, MD, FCCP, FACPDATE OF BIRTH:  DATE OF STUDY:  03/22/2013                           NOCTURNAL POLYSOMNOGRAM  REFERRING PHYSICIAN:  Eric L. August Saucer, M.D.  INDICATION FOR STUDY:  Hypersomnia with sleep apnea.  EPWORTH SLEEPINESS SCORE:  7/24.  BMI 26.6, weight 165 pounds, height 66 inches, neck 13 inches.  MEDICATIONS:  Home medications are charted and reviewed.  SLEEP ARCHITECTURE:  Total sleep time 310 minutes with sleep efficiency 75.7%.  Stage I was 10.5%, stage II 64.7%, stage III absent, REM 24.8% of total sleep time.  Sleep latency 68.5 minutes, REM latency 91 minutes, awake after sleep onset 30.5 minutes.  Arousal index 13.5.  BEDTIME MEDICATION:  Singulair, estazolam, alprazolam, carisoprodol.  RESPIRATORY DATA:  Apnea-hypopnea index (AHI) 0 per hour.  No events met scoring criteria for respiratory disturbance.  OXYGEN DATA:  Mild-to-moderate snoring with oxygen desaturation to a nadir of 93% and mean oxygen saturation through the study of 95.9% on room air.  CARDIAC DATA:  Normal sinus rhythm.  MOVEMENT-PARASOMNIA:  No movement disturbance.  Bathroom x1.  IMPRESSIONS-RECOMMENDATIONS: 1. Sleep architecture showed somewhat more frequent fragmentation by     spontaneous awakenings than average with sleep onset at 11:30 p.m.     Bedtime medications were listed as above, noting both estazolam and     alprazolam. 2. No respiratory disturbance on the study night.  AHI 0 per hour.     Mild-to-moderate snoring with oxygen desaturation to a nadir of 93%     and mean oxygen saturation through the study of 95.9% on room air.     Clinton D. Maple Hudson, MD, [facility], FACP Diplomate, American Board of Sleep Medicine    CDY/MEDQ  D:  03/23/2013 12:40:58  T:  03/23/2013 23:18:49   Job:  [id number]

## 2013-05-03 ENCOUNTER — Encounter: Payer: Self-pay | Admitting: Internal Medicine

## 2013-05-26 DIAGNOSIS — J189 Pneumonia, unspecified organism: Secondary | ICD-10-CM

## 2013-05-26 HISTORY — DX: Pneumonia, unspecified organism: J18.9

## 2013-05-29 ENCOUNTER — Ambulatory Visit
Admission: RE | Admit: 2013-05-29 | Discharge: 2013-05-29 | Disposition: A | Discharge status: Home / Self Care | Payer: BC Managed Care – PPO | Source: Ambulatory Visit | Attending: Internal Medicine | Admitting: Internal Medicine

## 2013-05-29 ENCOUNTER — Other Ambulatory Visit: Payer: Self-pay | Admitting: Internal Medicine

## 2013-05-29 DIAGNOSIS — R05 Cough: Secondary | ICD-10-CM

## 2013-05-31 ENCOUNTER — Emergency Department (HOSPITAL_COMMUNITY)
Admission: EM | Admit: 2013-05-31 | Discharge: 2013-05-31 | Disposition: A | Discharge status: Home / Self Care | Payer: BC Managed Care – PPO | Attending: Emergency Medicine | Admitting: Emergency Medicine

## 2013-05-31 ENCOUNTER — Encounter (HOSPITAL_COMMUNITY): Payer: Self-pay

## 2013-05-31 DIAGNOSIS — F17200 Nicotine dependence, unspecified, uncomplicated: Secondary | ICD-10-CM | POA: Insufficient documentation

## 2013-05-31 DIAGNOSIS — J189 Pneumonia, unspecified organism: Secondary | ICD-10-CM | POA: Insufficient documentation

## 2013-05-31 HISTORY — DX: Fibromyalgia: M79.7

## 2013-05-31 HISTORY — DX: Anxiety disorder, unspecified: F41.9

## 2013-05-31 NOTE — ED Notes (Addendum)
Patient reports that she was diagnosed with pneumonia 2 days ago and was prescribed the Z pack. Patient states that she was not feeling any better and called her PCP and was instructed to come to the ED for further treatment, evaluation and admission to the hospital. Patient has a nono productive cough

## 2013-06-03 ENCOUNTER — Encounter (HOSPITAL_COMMUNITY): Payer: Self-pay

## 2013-06-03 ENCOUNTER — Observation Stay (HOSPITAL_COMMUNITY)
Admission: AD | Admit: 2013-06-03 | Discharge: 2013-06-04 | Disposition: A | Discharge status: Home / Self Care | Payer: BC Managed Care – PPO | Source: Ambulatory Visit | Attending: Internal Medicine | Admitting: Internal Medicine

## 2013-06-03 ENCOUNTER — Inpatient Hospital Stay (HOSPITAL_COMMUNITY): Payer: BC Managed Care – PPO

## 2013-06-03 DIAGNOSIS — K219 Gastro-esophageal reflux disease without esophagitis: Secondary | ICD-10-CM | POA: Diagnosis present

## 2013-06-03 DIAGNOSIS — R490 Dysphonia: Secondary | ICD-10-CM | POA: Insufficient documentation

## 2013-06-03 DIAGNOSIS — M797 Fibromyalgia: Secondary | ICD-10-CM | POA: Diagnosis present

## 2013-06-03 DIAGNOSIS — D259 Leiomyoma of uterus, unspecified: Secondary | ICD-10-CM | POA: Insufficient documentation

## 2013-06-03 DIAGNOSIS — R143 Flatulence: Secondary | ICD-10-CM | POA: Insufficient documentation

## 2013-06-03 DIAGNOSIS — I152 Hypertension secondary to endocrine disorders: Secondary | ICD-10-CM | POA: Diagnosis present

## 2013-06-03 DIAGNOSIS — N289 Disorder of kidney and ureter, unspecified: Secondary | ICD-10-CM

## 2013-06-03 DIAGNOSIS — J189 Pneumonia, unspecified organism: Principal | ICD-10-CM | POA: Diagnosis present

## 2013-06-03 DIAGNOSIS — IMO0001 Reserved for inherently not codable concepts without codable children: Secondary | ICD-10-CM | POA: Insufficient documentation

## 2013-06-03 DIAGNOSIS — K589 Irritable bowel syndrome without diarrhea: Secondary | ICD-10-CM | POA: Insufficient documentation

## 2013-06-03 DIAGNOSIS — I10 Essential (primary) hypertension: Secondary | ICD-10-CM | POA: Insufficient documentation

## 2013-06-03 DIAGNOSIS — B009 Herpesviral infection, unspecified: Secondary | ICD-10-CM

## 2013-06-03 DIAGNOSIS — R141 Gas pain: Secondary | ICD-10-CM | POA: Insufficient documentation

## 2013-06-03 DIAGNOSIS — J45909 Unspecified asthma, uncomplicated: Secondary | ICD-10-CM | POA: Insufficient documentation

## 2013-06-03 DIAGNOSIS — R142 Eructation: Secondary | ICD-10-CM | POA: Insufficient documentation

## 2013-06-03 DIAGNOSIS — D219 Benign neoplasm of connective and other soft tissue, unspecified: Secondary | ICD-10-CM

## 2013-06-03 HISTORY — DX: Unspecified asthma, uncomplicated: J45.909

## 2013-06-03 HISTORY — DX: Essential (primary) hypertension: I10

## 2013-06-03 HISTORY — DX: Other specified postprocedural states: Z98.890

## 2013-06-03 HISTORY — DX: Nausea with vomiting, unspecified: R11.2

## 2013-06-03 LAB — COMPREHENSIVE METABOLIC PANEL
Albumin: 3.8 g/dL (ref 3.5–5.2)
Alkaline Phosphatase: 66 U/L (ref 39–117)
BUN: 17 mg/dL (ref 6–23)
Creatinine, Ser: 1.2 mg/dL — ABNORMAL HIGH (ref 0.50–1.10)
GFR calc Af Amer: 60 mL/min — ABNORMAL LOW (ref >90)
Glucose, Bld: 79 mg/dL (ref 70–99)
Total Protein: 7.6 g/dL (ref 6.0–8.3)

## 2013-06-03 LAB — CBC WITH DIFFERENTIAL/PLATELET
Basophils Absolute: 0.1 10*3/uL (ref 0.0–0.1)
Eosinophils Relative: 0 % (ref 0–5)
HCT: 40.1 % (ref 36.0–46.0)
Lymphocytes Relative: 30 % (ref 12–46)
Lymphs Abs: 2.9 10*3/uL (ref 0.7–4.0)
MCV: 93.3 fL (ref 78.0–100.0)
Monocytes Absolute: 0.5 10*3/uL (ref 0.1–1.0)
Neutro Abs: 6.1 10*3/uL (ref 1.7–7.7)
Platelets: 353 10*3/uL (ref 150–400)
RBC: 4.3 MIL/uL (ref 3.87–5.11)
RDW: 14.3 % (ref 11.5–15.5)
WBC: 9.6 10*3/uL (ref 4.0–10.5)

## 2013-06-03 LAB — URINALYSIS, ROUTINE W REFLEX MICROSCOPIC
Glucose, UA: NEGATIVE mg/dL
Leukocytes, UA: NEGATIVE
Nitrite: NEGATIVE
Specific Gravity, Urine: 1.01 (ref 1.005–1.030)
pH: 6 (ref 5.0–8.0)

## 2013-06-03 MED ORDER — PANTOPRAZOLE SODIUM 40 MG PO TBEC
80.0000 mg | DELAYED_RELEASE_TABLET | Freq: Every day | ORAL | Status: DC
  Administered 2013-06-03 – 2013-06-04 (×2): 80 mg via ORAL
  Filled 2013-06-03 (×3): qty 2

## 2013-06-03 MED ORDER — FLUTICASONE PROPIONATE 50 MCG/ACT NA SUSP
2.0000 | Freq: Every day | NASAL | Status: DC
  Administered 2013-06-03 – 2013-06-04 (×2): 2 via NASAL
  Filled 2013-06-03: qty 16

## 2013-06-03 MED ORDER — LEVOFLOXACIN IN D5W 750 MG/150ML IV SOLN
750.0000 mg | INTRAVENOUS | Status: DC
  Administered 2013-06-03: 750 mg via INTRAVENOUS
  Filled 2013-06-03 (×2): qty 150

## 2013-06-03 MED ORDER — IOHEXOL 300 MG/ML  SOLN
80.0000 mL | Freq: Once | INTRAMUSCULAR | Status: AC | PRN
  Administered 2013-06-03: 80 mL via INTRAVENOUS

## 2013-06-03 MED ORDER — SODIUM CHLORIDE 0.9 % IV SOLN: 250.0000 mL | INTRAVENOUS | Status: DC | PRN

## 2013-06-03 MED ORDER — NEBIVOLOL HCL 10 MG PO TABS
10.0000 mg | ORAL_TABLET | Freq: Every day | ORAL | Status: DC
  Administered 2013-06-03 – 2013-06-04 (×2): 10 mg via ORAL
  Filled 2013-06-03 (×2): qty 1

## 2013-06-03 MED ORDER — ADULT MULTIVITAMIN W/MINERALS CH
1.0000 | ORAL_TABLET | Freq: Every day | ORAL | Status: DC
  Administered 2013-06-03 – 2013-06-04 (×2): 1 via ORAL
  Filled 2013-06-03 (×2): qty 1

## 2013-06-03 MED ORDER — TOPIRAMATE 25 MG PO TABS
50.0000 mg | ORAL_TABLET | Freq: Every day | ORAL | Status: DC
  Administered 2013-06-03: 50 mg via ORAL
  Filled 2013-06-03 (×2): qty 2

## 2013-06-03 MED ORDER — LORATADINE 10 MG PO TABS
10.0000 mg | ORAL_TABLET | Freq: Every day | ORAL | Status: DC
  Administered 2013-06-03 – 2013-06-04 (×2): 10 mg via ORAL
  Filled 2013-06-03 (×2): qty 1

## 2013-06-03 MED ORDER — HEPARIN SODIUM (PORCINE) 5000 UNIT/ML IJ SOLN
5000.0000 [IU] | Freq: Three times a day (TID) | INTRAMUSCULAR | Status: DC
  Administered 2013-06-03: 5000 [IU] via SUBCUTANEOUS
  Filled 2013-06-03 (×6): qty 1

## 2013-06-03 MED ORDER — OMEGA-3-ACID ETHYL ESTERS 1 G PO CAPS
2.0000 g | ORAL_CAPSULE | Freq: Every day | ORAL | Status: DC
  Administered 2013-06-03 – 2013-06-04 (×2): 2 g via ORAL
  Filled 2013-06-03 (×2): qty 2

## 2013-06-03 MED ORDER — ONDANSETRON HCL 4 MG PO TABS: 4.0000 mg | ORAL_TABLET | Freq: Four times a day (QID) | ORAL | Status: DC | PRN

## 2013-06-03 MED ORDER — DILTIAZEM HCL ER BEADS 300 MG PO CP24
300.0000 mg | ORAL_CAPSULE | Freq: Every day | ORAL | Status: DC
  Administered 2013-06-04: 300 mg via ORAL
  Filled 2013-06-03 (×2): qty 1

## 2013-06-03 MED ORDER — ACETAMINOPHEN 650 MG RE SUPP: 650.0000 mg | Freq: Four times a day (QID) | RECTAL | Status: DC | PRN

## 2013-06-03 MED ORDER — GUAIFENESIN-DM 100-10 MG/5ML PO SYRP: 5.0000 mL | ORAL_SOLUTION | ORAL | Status: DC | PRN

## 2013-06-03 MED ORDER — NORETHIN ACE-ETH ESTRAD-FE 1-20 MG-MCG PO TABS
1.0000 | ORAL_TABLET | Freq: Every day | ORAL | Status: DC
  Administered 2013-06-04: 1 via ORAL

## 2013-06-03 MED ORDER — TRAMADOL HCL 50 MG PO TABS
50.0000 mg | ORAL_TABLET | Freq: Four times a day (QID) | ORAL | Status: DC | PRN
  Administered 2013-06-03 – 2013-06-04 (×2): 50 mg via ORAL
  Filled 2013-06-03 (×2): qty 1

## 2013-06-03 MED ORDER — POLYETHYLENE GLYCOL 3350 17 G PO PACK
17.0000 g | PACK | Freq: Every day | ORAL | Status: DC | PRN
  Filled 2013-06-03: qty 1

## 2013-06-03 MED ORDER — TEMAZEPAM 15 MG PO CAPS
15.0000 mg | ORAL_CAPSULE | Freq: Every evening | ORAL | Status: DC | PRN
  Administered 2013-06-03: 15 mg via ORAL
  Filled 2013-06-03: qty 1

## 2013-06-03 MED ORDER — MONTELUKAST SODIUM 10 MG PO TABS
10.0000 mg | ORAL_TABLET | Freq: Every day | ORAL | Status: DC
  Administered 2013-06-03: 10 mg via ORAL
  Filled 2013-06-03 (×2): qty 1

## 2013-06-03 MED ORDER — SODIUM CHLORIDE 0.9 % IJ SOLN: 3.0000 mL | INTRAMUSCULAR | Status: DC | PRN

## 2013-06-03 MED ORDER — OMEGA-3 FATTY ACIDS 1000 MG PO CAPS: 2.0000 g | ORAL_CAPSULE | Freq: Every day | ORAL | Status: DC

## 2013-06-03 MED ORDER — ALUM & MAG HYDROXIDE-SIMETH 200-200-20 MG/5ML PO SUSP: 30.0000 mL | Freq: Four times a day (QID) | ORAL | Status: DC | PRN

## 2013-06-03 MED ORDER — ALPRAZOLAM 1 MG PO TABS
1.0000 mg | ORAL_TABLET | Freq: Every evening | ORAL | Status: DC | PRN
  Administered 2013-06-03: 1 mg via ORAL
  Filled 2013-06-03: qty 1

## 2013-06-03 MED ORDER — FLUTICASONE PROPIONATE HFA 44 MCG/ACT IN AERO
1.0000 | INHALATION_SPRAY | Freq: Two times a day (BID) | RESPIRATORY_TRACT | Status: DC
  Administered 2013-06-03 – 2013-06-04 (×2): 1 via RESPIRATORY_TRACT
  Filled 2013-06-03: qty 10.6

## 2013-06-03 MED ORDER — SODIUM CHLORIDE 0.9 % IJ SOLN
3.0000 mL | Freq: Two times a day (BID) | INTRAMUSCULAR | Status: DC
  Administered 2013-06-03 – 2013-06-04 (×2): 3 mL via INTRAVENOUS

## 2013-06-03 MED ORDER — VITAMIN D3 25 MCG (1000 UNIT) PO TABS
1000.0000 [IU] | ORAL_TABLET | Freq: Every day | ORAL | Status: DC
  Administered 2013-06-03 – 2013-06-04 (×2): 1000 [IU] via ORAL
  Filled 2013-06-03 (×2): qty 1

## 2013-06-03 MED ORDER — ONDANSETRON HCL 4 MG/2ML IJ SOLN: 4.0000 mg | Freq: Four times a day (QID) | INTRAMUSCULAR | Status: DC | PRN

## 2013-06-03 MED ORDER — ALBUTEROL SULFATE (5 MG/ML) 0.5% IN NEBU: 2.5000 mg | INHALATION_SOLUTION | RESPIRATORY_TRACT | Status: DC | PRN

## 2013-06-03 MED ORDER — ACETAMINOPHEN 325 MG PO TABS: 650.0000 mg | ORAL_TABLET | Freq: Four times a day (QID) | ORAL | Status: DC | PRN

## 2013-06-03 MED ORDER — LEVOCETIRIZINE DIHYDROCHLORIDE 5 MG PO TABS: 5.0000 mg | ORAL_TABLET | Freq: Every evening | ORAL | Status: DC

## 2013-06-03 MED ORDER — CARISOPRODOL 350 MG PO TABS
350.0000 mg | ORAL_TABLET | Freq: Three times a day (TID) | ORAL | Status: DC
  Administered 2013-06-03 – 2013-06-04 (×3): 350 mg via ORAL
  Filled 2013-06-03 (×3): qty 1

## 2013-06-03 NOTE — H&P (Signed)
[facility] History and Physical  [name]:[id number] DOB: 1961-08-09 DOA: 06/03/2013  Referring physician: Dr. August Saucer PCP: August Saucer, ERIC, MD   Chief Complaint: Dyspnea, cough   History of Present Illness: Maria [name] is an 52 y.o. female with a PMH of tobacco abuse and asthma who was treated for bronchitis with a Z-pack by Dr. August Saucer, finished this 06/02/13, with no significant improvement in symptoms which include hoarseness, occasional dyspnea, dry cough, fever and chills.  Also notices some abdominal swelling/bloating (h/o IBS).  No current complaints of chest pain, but had an episode of ? Pleuritic versus esophageal pain last week. A chest x-ray was done 05/29/2013 which showed an added density along the posterior sternum, new from prior exams.  Review of Systems: Constitutional: + fever and chills;  Appetite diminished; No weight loss, + weight gain.  HEENT: No blurry vision, no diplopia, no pharyngitis, no dysphagia, +hoarseness CV: + recent pleuritic chest pain, no palpitations.  Resp: + SOB, + cough. GI: No nausea, no vomiting, + recent diarrhea (gone now), no melena, no hematochezia.  GU: No dysuria, no hematuria.  MSK: + chronic myalgias and arthralgias.  Neuro:  No headache, no focal neurological deficits, no history of seizures.  Psych: No depression, + anxiety.  Endo: No thyroid disease, no DM, no heat intolerance, no cold intolerance, no polyuria, no polydipsia  Skin: No rashes, no skin lesions.  Heme: No easy bruising, no history of blood diseases.  Past Medical History Past Medical History  Diagnosis Date  . Fibroid   . Pelvic pain in female   . Fibromyalgia   . Anxiety   . PONV (postoperative nausea and vomiting)   . Asthma   . HTN (hypertension)      Past Surgical History Past Surgical History  Procedure Laterality Date  . Dilation and curettage of uterus       Social History: History   Social History  . Marital Status: Single    Spouse Name:  N/A    Number of Children: 0  . Years of Education: N/A   Occupational History  . Employed, Occupancy Specialist    Social History Main Topics  . Smoking status: Current Every Day Smoker -- 0.25 packs/day    Types: Cigarettes  . Smokeless tobacco: Never Used     Comment: 5 cigarettes per day   . Alcohol Use: No  . Drug Use: No  . Sexually Active: Yes    Birth Control/ Protection: Pill     Comment: Junel    Other Topics Concern  . Not on file   Social History Narrative   Divorced.  Lives alone.    Family History:  Family History  Problem Relation Age of Onset  . Cancer Mother   . Cancer Brother   . Hypertension Brother     Allergies: Advair diskus  Meds: Prior to Admission medications   Medication Sig Start Date End Date Taking? Authorizing Provider  ALPRAZolam Prudy Feeler) 1 MG tablet Take 1 mg by mouth at bedtime as needed for sleep or anxiety.    Historical Provider, MD  azithromycin (ZITHROMAX) 250 MG tablet Take 250 mg by mouth daily. Take 500 mg on day 1 the take 250 mg daily for 4 days    Historical Provider, MD  beclomethasone (QVAR) 40 MCG/ACT inhaler Inhale 2 puffs into the lungs 2 (two) times daily.    Historical Provider, MD  carisoprodol (SOMA) 250 MG tablet Take 350 mg by mouth 3 (three) times daily.  Historical Provider, MD  cholecalciferol (VITAMIN D) 1000 UNITS tablet Take 1,000 Units by mouth daily.    Historical Provider, MD  dexlansoprazole (DEXILANT) 60 MG capsule Take 60 mg by mouth daily.    Historical Provider, MD  diltiazem (TIAZAC) 300 MG 24 hr capsule Take 300 mg by mouth daily with breakfast.     Historical Provider, MD  estazolam (PROSOM) 2 MG tablet Take 2 mg by mouth at bedtime.    Historical Provider, MD  fish oil-omega-3 fatty acids 1000 MG capsule Take 2 g by mouth daily.    Historical Provider, MD  fluticasone (FLONASE) 50 MCG/ACT nasal spray Place 2 sprays into the nose daily.    Historical Provider, MD  levocetirizine (XYZAL) 5 MG  tablet Take 5 mg by mouth every evening.    Historical Provider, MD  montelukast (SINGULAIR) 10 MG tablet Take 10 mg by mouth at bedtime.    Historical Provider, MD  Multiple Vitamin (MULTIVITAMIN WITH MINERALS) TABS Take 1 tablet by mouth daily.    Historical Provider, MD  nebivolol (BYSTOLIC) 10 MG tablet Take 10 mg by mouth daily.    Historical Provider, MD  norethindrone-ethinyl estradiol (JUNEL FE 1/20) 1-20 MG-MCG tablet Take 1 tablet by mouth daily. 08/07/12 08/07/13  [name], MD  topiramate (TOPAMAX) 50 MG tablet Take 50 mg by mouth at bedtime.    Historical Provider, MD  traMADol (ULTRAM) 50 MG tablet Take 50 mg by mouth every 6 (six) hours as needed.    Historical Provider, MD    Physical Exam: Filed Vitals:   06/03/13 1401  BP: 148/86  Pulse: 62  Temp: 98.5 F (36.9 C)  TempSrc: Oral  Resp: 18  SpO2: 98%     Physical Exam: Blood pressure 148/86, pulse 62, temperature 98.5 F (36.9 C), temperature source Oral, resp. rate 18, last menstrual period 05/21/2013, SpO2 98.00%. Gen: No acute distress. Head: Normocephalic, atraumatic. Eyes: PERRL, EOMI, sclerae nonicteric. Mouth: Oropharynx clear. Neck: Supple, no thyromegaly, no lymphadenopathy, no jugular venous distention. Chest: Lungs clear to auscultation bilaterally. Good air movement. CV: Heart sounds are regular. No murmurs, rubs, or gallops. Abdomen: Soft, nontender, nondistended with normal active bowel sounds. Extremities: Extremities are without clubbing, edema, or cyanosis. Skin: Warm and dry. Neuro: Alert and oriented times 3; cranial nerves II through XII grossly intact. Psych: Mood and affect normal.  Labs on Admission:  CMET, CBC with differential ordered on admission.  Radiological Exams on Admission: CT chest ordered on admission  EKG Ordered on admission.  Assessment/Plan Principal Problem:   CAP (community acquired pneumonia) -Admit and place on empiric Levaquin. Obtain blood cultures for  fever greater than 101. -CT scan of the chest for further evaluation of abnormal chest x-ray. Active Problems:   GERD (gastroesophageal reflux disease) -Continue PPI therapy.   HTN (hypertension) -Continue home antihypertensives.   Fibromyalgia syndrome -Continue home pain control regimen.  Code Status: Full. Family Communication: Piedad Climes (brother) [phone] or [phone] (cell) Disposition Plan: Home when stable.  Time spent: 1 hour.  RAMA,CHRISTINA [facility] Pager [phone]  If 7PM-7AM, please contact night-coverage [email or web address] Password [facility] 06/03/2013, 2:53 PM

## 2013-06-04 DIAGNOSIS — I10 Essential (primary) hypertension: Secondary | ICD-10-CM

## 2013-06-04 DIAGNOSIS — K219 Gastro-esophageal reflux disease without esophagitis: Secondary | ICD-10-CM

## 2013-06-04 DIAGNOSIS — J189 Pneumonia, unspecified organism: Secondary | ICD-10-CM

## 2013-06-04 MED ORDER — LEVOFLOXACIN 750 MG PO TABS: 750.0000 mg | ORAL_TABLET | Freq: Every day | ORAL | Status: DC

## 2013-06-04 MED ORDER — SODIUM CHLORIDE 0.9 % IV SOLN: 250.0000 mL | INTRAVENOUS | Status: AC | PRN

## 2013-06-04 NOTE — Discharge Summary (Signed)
[facility]                                                                                   Maria [name], is a 52 y.o. female  DOB 1961-08-16  MRN [id number].  Admission date:  06/03/2013  Discharge Date:  06/04/2013  Primary MD  Willey Blade, MD  Admitting Physician  Maryruth Bun Rama, MD  Admission Diagnosis  PNA  Discharge Diagnosis     Principal Problem:   CAP (community acquired pneumonia) Active Problems:   GERD (gastroesophageal reflux disease)   HTN (hypertension)   Fibromyalgia syndrome   Past Medical History  Diagnosis Date  . Fibroid   . Pelvic pain in female   . Fibromyalgia   . Anxiety   . PONV (postoperative nausea and vomiting)   . Asthma   . HTN (hypertension)     Past Surgical History  Procedure Laterality Date  . Dilation and curettage of uterus       Recommendations for primary care physician for things to follow:    Repeat 2 view chest x-ray in a week, outpatient GI and GYN followup for IBS and abdominal distention with history of uterine fibroid   Discharge Diagnoses:   Principal Problem:   CAP (community acquired pneumonia) Active Problems:   GERD (gastroesophageal reflux disease)   HTN (hypertension)   Fibromyalgia syndrome    Discharge Condition: stable   Diet recommendation: See Discharge Instructions below   Consults     History of present illness and  Hospital Course:     Kindly see H&P for history of present illness and admission details, please review complete Labs, Consult reports and Test reports for all details in brief Maria [name], is a 52 y.o. female, patient with history of tobacco abuse, IBS, fibromyalgia, uterine fibroid was admitted with outpatient diagnosis of coronary acquired pneumonia, she had some subjective cough, low-grade fevers and chills, however the repeat CT scan of her chest done after admission was absolutely normal with no acute findings, at best patient might have had mild  bronchitis, she had failed outpatient Z-Pak treatment therefore she will be given 5 more days of oral Levaquin, her only complaint at this time is some abdominal bloating which he attributes to her history of IBS and uterine fibroid, I do think that she should follow with GI one time and also with her GYN to rule out any other etiology for her abdominal symptoms. Her abdominal exam bedside is benign.  Patient has been counseled to quit smoking.     Today   Subjective:   Maria [name] today has no headache,no chest abdominal pain,no new weakness tingling or numbness, feels much better wants to go home today.   Objective:   Blood pressure 134/91, pulse 69, temperature 98.6 F (37 C), temperature source Oral, resp. rate 18, height 5\' 5"  (1.651 m), weight 76.6 kg (168 lb 14 oz), last menstrual period 05/21/2013, SpO2 99.00%.  No intake or output data in the 24 hours ending 06/04/13 1057  Exam Awake Alert, Oriented *3, No new F.N deficits, Normal affect [place].AT,PERRAL Supple Neck,No JVD, No cervical lymphadenopathy appriciated.  Symmetrical Chest wall movement, Good  air movement bilaterally, CTAB RRR,No Gallops,Rubs or new Murmurs, No Parasternal Heave +ve B.Sounds, Abd Soft, Non tender, No organomegaly appriciated, No rebound -guarding or rigidity. No Cyanosis, Clubbing or edema, No new Rash or bruise  Data Review   Major procedures and Radiology Reports - PLEASE review detailed and final reports for all details in brief -       Dg Chest 2 View  05/29/2013   *RADIOLOGY REPORT*  Clinical Data: Cough, congestion and fever.  CHEST - 2 VIEW  Comparison: 09/11/2009.  Findings: Trachea is midline.  Heart size normal. On the lateral view, there is retrosternal soft tissue density, not seen on prior studies.  Lungs are otherwise clear.  No pleural fluid.  IMPRESSION: Added density along the posterior sternum.  Finding appears new from prior exams.  Question old trauma (sternal and/or rib  fractures).  If further evaluation is desired, CT chest without contrast is recommended.   Original Report Authenticated By: Leanna Battles, M.D.   Ct Chest W Contrast  06/03/2013   *RADIOLOGY REPORT*  Clinical Data: Pneumonia versus mass.  History of tobacco abuse and asthma treated for bronchitis.  No significant improvement.  CT CHEST WITH CONTRAST  Technique:  Multidetector CT imaging of the chest was performed following the standard protocol during bolus administration of intravenous contrast.  Contrast: 80mL OMNIPAQUE IOHEXOL 300 MG/ML  SOLN  Comparison: Chest 05/29/2013.  Findings: Normal heart size.  Normal caliber thoracic aorta. Visualized central pulmonary arteries appear patent.  Esophagus is decompressed.  No abnormal mediastinal fluid or gas collections. No significant lymphadenopathy in the chest.  No pleural effusions. No pneumothorax.  Lungs appear clear and expanded.  No focal consolidation or airspace disease.  No interstitial infiltration. Mild hypertrophic degenerative changes in the spine.  The sternum appears intact.  Posterior sternal density demonstrated on plain film likely representing overlapping structures.  No destructive bone lesions appreciated.  Visualized portions of the upper abdominal organs are grossly unremarkable.  IMPRESSION: No evidence of active pulmonary disease.   Original Report Authenticated By: Burman Nieves, M.D.    Micro Results     No results found for this or any previous visit (from the past 240 hour(s)).   CBC w Diff:  Lab Results  Component Value Date   WBC 9.6 06/03/2013   HGB 13.3 06/03/2013   HCT 40.1 06/03/2013   PLT 353 06/03/2013   LYMPHOPCT 30 06/03/2013   MONOPCT 6 06/03/2013   EOSPCT 0 06/03/2013   BASOPCT 1 06/03/2013    CMP:  Lab Results  Component Value Date   NA 141 06/03/2013   K 3.9 06/03/2013   CL 107 06/03/2013   CO2 23 06/03/2013   BUN 17 06/03/2013   CREATININE 1.20* 06/03/2013   PROT 7.6 06/03/2013   ALBUMIN 3.8 06/03/2013   BILITOT 0.1*  06/03/2013   ALKPHOS 66 06/03/2013   AST 12 06/03/2013   ALT 10 06/03/2013  .   Discharge Instructions     Follow with Primary MD August Saucer, ERIC, MD in 7 days , follow with recommended GI physician and your gynecologist for uterine fibroid and ongoing irritable bowel syndrome causing abdominal distention.  Get CBC, CMP, checked 7 days by Primary MD and again as instructed by your Primary MD. Get a 2 view Chest X ray done next visit .  Get Medicines reviewed and adjusted.  Please request your Prim.MD to go over all Hospital Tests and Procedure/Radiological results at the follow up, please get all Hospital records sent  to your Prim MD by signing hospital release before you go home.  Activity: As tolerated with Full fall precautions use walker/cane & assistance as needed   Diet: Heart healthy  For Heart failure patients - Check your Weight same time everyday, if you gain over 2 pounds, or you develop in leg swelling, experience more shortness of breath or chest pain, call your Primary MD immediately. Follow Cardiac Low Salt Diet and 1.8 lit/day fluid restriction.  Disposition Home    If you experience worsening of your admission symptoms, develop shortness of breath, life threatening emergency, suicidal or homicidal thoughts you must seek medical attention immediately by calling 911 or calling your MD immediately  if symptoms less severe.  You Must read complete instructions/literature along with all the possible adverse reactions/side effects for all the Medicines you take and that have been prescribed to you. Take any new Medicines after you have completely understood and accpet all the possible adverse reactions/side effects.   Do not drive and provide baby sitting services if your were admitted for syncope or siezures until you have seen by Primary MD or a Neurologist and advised to do so again.  Do not drive when taking Pain medications.    Do not take more than prescribed Pain, Sleep and  Anxiety Medications  Special Instructions: If you have smoked or chewed Tobacco  in the last 2 yrs please stop smoking, stop any regular Alcohol  and or any Recreational drug use.  Wear Seat belts while driving.   Please note  You were cared for by a hospitalist during your hospital stay. If you have any questions about your discharge medications or the care you received while you were in the hospital after you are discharged, you can call the unit and asked to speak with the hospitalist on call if the hospitalist that took care of you is not available. Once you are discharged, your primary care physician will handle any further medical issues. Please note that NO REFILLS for any discharge medications will be authorized once you are discharged, as it is imperative that you return to your primary care physician (or establish a relationship with a primary care physician if you do not have one) for your aftercare needs so that they can reassess your need for medications and monitor your lab values.   Follow-up Information   Follow up with August Saucer, ERIC, MD. Schedule an appointment as soon as possible for a visit in 1 week. (And your gynecologist for uterine fibroid and abdominal bloating)    Contact information:   [facility] [address] [place] [address] [phone]       Follow up with [facility]. Schedule an appointment as soon as possible for a visit in 1 day. (IBS)    Contact information:   [address] level [place] [address]         Discharge Medications     Medication [name]    STOP taking these medications       azithromycin 250 MG tablet  Commonly known as:  ZITHROMAX      TAKE these medications       ALPRAZolam 1 MG tablet  Commonly known as:  XANAX  Take 1 mg by mouth at bedtime as needed for sleep or anxiety.     beclomethasone 40 MCG/ACT inhaler  Commonly known as:  QVAR  Inhale 2 puffs into the  lungs 2 (two) times daily.  carisoprodol 250 MG tablet  Commonly known as:  SOMA  Take 350 mg by mouth 3 (three) times daily.     cholecalciferol 1000 UNITS tablet  Commonly known as:  VITAMIN D  Take 1,000 Units by mouth daily.     DEXILANT 60 MG capsule  Generic drug:  dexlansoprazole  Take 60 mg by mouth daily.     diltiazem 300 MG 24 hr capsule  Commonly known as:  TIAZAC  Take 300 mg by mouth daily with breakfast.     estazolam 2 MG tablet  Commonly known as:  PROSOM  Take 2 mg by mouth at bedtime.     fish oil-omega-3 fatty acids 1000 MG capsule  Take 2 g by mouth daily.     fluticasone 50 MCG/ACT nasal spray  Commonly known as:  FLONASE  Place 2 sprays into the nose daily.     HYDROcodone-acetaminophen 7.5-325 MG per tablet  Commonly known as:  NORCO  Take 1 tablet by mouth every 6 (six) hours as needed for pain.     levocetirizine 5 MG tablet  Commonly known as:  XYZAL  Take 5 mg by mouth every evening.     levofloxacin 750 MG tablet  Commonly known as:  LEVAQUIN  Take 1 tablet (750 mg total) by mouth daily.     lidocaine 5 % ointment  Commonly known as:  XYLOCAINE  Apply 1 application topically daily. Apply 1/4 inch 2-3 times a day to affected area as needed     montelukast 10 MG tablet  Commonly known as:  SINGULAIR  Take 10 mg by mouth at bedtime.     multivitamin with minerals Tabs  Take 1 tablet by mouth daily.     nebivolol 10 MG tablet  Commonly known as:  BYSTOLIC  Take 10 mg by mouth daily.     norethindrone-ethinyl estradiol 1-20 MG-MCG tablet  Commonly known as:  JUNEL FE 1/20  Take 1 tablet by mouth daily.     topiramate 50 MG tablet  Commonly known as:  TOPAMAX  Take 50 mg by mouth at bedtime.     traMADol 50 MG tablet  Commonly known as:  ULTRAM  Take 50 mg by mouth every 6 (six) hours as needed.           Total Time in preparing paper work, data evaluation and todays exam - 35 minutes  Leroy Sea M.D on  06/04/2013 at 10:57 AM  [facility] Office  [phone]

## 2013-06-04 NOTE — Progress Notes (Signed)
Patient to be discharged home in stable condition.  No change from morning assessment.  Discharge instructions given to pt with verbal understanding and feedback.

## 2013-07-09 ENCOUNTER — Other Ambulatory Visit: Payer: Self-pay

## 2013-07-09 ENCOUNTER — Other Ambulatory Visit: Payer: Self-pay | Admitting: Obstetrics and Gynecology

## 2013-07-09 DIAGNOSIS — Z1231 Encounter for screening mammogram for malignant neoplasm of breast: Secondary | ICD-10-CM

## 2013-07-09 DIAGNOSIS — N644 Mastodynia: Secondary | ICD-10-CM

## 2013-08-22 ENCOUNTER — Ambulatory Visit
Admission: RE | Admit: 2013-08-22 | Discharge: 2013-08-22 | Disposition: A | Discharge status: Home / Self Care | Payer: BC Managed Care – PPO | Source: Ambulatory Visit | Attending: Obstetrics and Gynecology | Admitting: Obstetrics and Gynecology

## 2013-08-22 DIAGNOSIS — Z1231 Encounter for screening mammogram for malignant neoplasm of breast: Secondary | ICD-10-CM

## 2013-08-29 ENCOUNTER — Other Ambulatory Visit: Payer: Self-pay | Admitting: Obstetrics and Gynecology

## 2013-08-29 DIAGNOSIS — R928 Other abnormal and inconclusive findings on diagnostic imaging of breast: Secondary | ICD-10-CM

## 2013-09-16 ENCOUNTER — Ambulatory Visit
Admission: RE | Admit: 2013-09-16 | Discharge: 2013-09-16 | Disposition: A | Discharge status: Home / Self Care | Payer: BC Managed Care – PPO | Source: Ambulatory Visit | Attending: Obstetrics and Gynecology | Admitting: Obstetrics and Gynecology

## 2013-09-16 DIAGNOSIS — R928 Other abnormal and inconclusive findings on diagnostic imaging of breast: Secondary | ICD-10-CM

## 2013-12-17 ENCOUNTER — Ambulatory Visit (HOSPITAL_BASED_OUTPATIENT_CLINIC_OR_DEPARTMENT_OTHER): Admission: RE | Admit: 2013-12-17 | Payer: BC Managed Care – PPO | Source: Ambulatory Visit | Admitting: Otolaryngology

## 2013-12-17 ENCOUNTER — Encounter (HOSPITAL_BASED_OUTPATIENT_CLINIC_OR_DEPARTMENT_OTHER): Admission: RE | Payer: Self-pay | Source: Ambulatory Visit

## 2013-12-17 SURGERY — REDUCTION, NASAL TURBINATE
Anesthesia: General | Laterality: Right

## 2014-07-29 ENCOUNTER — Other Ambulatory Visit: Payer: Self-pay

## 2014-07-29 DIAGNOSIS — Z803 Family history of malignant neoplasm of breast: Secondary | ICD-10-CM

## 2014-07-29 DIAGNOSIS — Z1231 Encounter for screening mammogram for malignant neoplasm of breast: Secondary | ICD-10-CM

## 2014-08-25 ENCOUNTER — Ambulatory Visit
Admission: RE | Admit: 2014-08-25 | Discharge: 2014-08-25 | Disposition: A | Discharge status: Home / Self Care | Payer: BC Managed Care – PPO | Source: Ambulatory Visit

## 2014-08-25 DIAGNOSIS — Z1231 Encounter for screening mammogram for malignant neoplasm of breast: Secondary | ICD-10-CM

## 2014-08-25 DIAGNOSIS — Z803 Family history of malignant neoplasm of breast: Secondary | ICD-10-CM

## 2014-10-27 ENCOUNTER — Encounter (HOSPITAL_COMMUNITY): Payer: Self-pay

## 2014-12-09 ENCOUNTER — Other Ambulatory Visit: Payer: Self-pay | Admitting: Allergy

## 2014-12-09 ENCOUNTER — Ambulatory Visit
Admission: RE | Admit: 2014-12-09 | Discharge: 2014-12-09 | Disposition: A | Discharge status: Home / Self Care | Payer: BC Managed Care – PPO | Source: Ambulatory Visit | Attending: Allergy | Admitting: Allergy

## 2014-12-09 DIAGNOSIS — J4530 Mild persistent asthma, uncomplicated: Secondary | ICD-10-CM

## 2015-01-15 ENCOUNTER — Other Ambulatory Visit: Payer: Self-pay | Admitting: Pain Medicine

## 2015-01-15 DIAGNOSIS — M542 Cervicalgia: Secondary | ICD-10-CM

## 2015-01-21 ENCOUNTER — Ambulatory Visit
Admission: RE | Admit: 2015-01-21 | Discharge: 2015-01-21 | Disposition: A | Discharge status: Home / Self Care | Payer: BLUE CROSS/BLUE SHIELD | Source: Ambulatory Visit | Attending: Pain Medicine | Admitting: Pain Medicine

## 2015-01-21 DIAGNOSIS — M542 Cervicalgia: Secondary | ICD-10-CM

## 2015-08-18 ENCOUNTER — Other Ambulatory Visit: Payer: Self-pay

## 2015-08-18 DIAGNOSIS — Z1231 Encounter for screening mammogram for malignant neoplasm of breast: Secondary | ICD-10-CM

## 2015-08-28 ENCOUNTER — Ambulatory Visit
Admission: RE | Admit: 2015-08-28 | Discharge: 2015-08-28 | Disposition: A | Discharge status: Home / Self Care | Payer: BLUE CROSS/BLUE SHIELD | Source: Ambulatory Visit

## 2015-08-28 DIAGNOSIS — Z1231 Encounter for screening mammogram for malignant neoplasm of breast: Secondary | ICD-10-CM

## 2016-03-02 ENCOUNTER — Ambulatory Visit (INDEPENDENT_AMBULATORY_CARE_PROVIDER_SITE_OTHER): Payer: BLUE CROSS/BLUE SHIELD

## 2016-03-02 ENCOUNTER — Ambulatory Visit (INDEPENDENT_AMBULATORY_CARE_PROVIDER_SITE_OTHER): Payer: BLUE CROSS/BLUE SHIELD | Admitting: Family Medicine

## 2016-03-02 VITALS — BP 112/76 | HR 72 | Temp 98.5°F | Resp 16 | Ht 65.0 in | Wt 173.0 lb

## 2016-03-02 DIAGNOSIS — R059 Cough, unspecified: Secondary | ICD-10-CM

## 2016-03-02 DIAGNOSIS — R05 Cough: Secondary | ICD-10-CM

## 2016-03-02 DIAGNOSIS — R0789 Other chest pain: Secondary | ICD-10-CM

## 2016-03-02 MED ORDER — HYDROCOD POLST-CPM POLST ER 10-8 MG/5ML PO SUER: 5.0000 mL | Freq: Two times a day (BID) | ORAL | Status: DC | PRN

## 2016-03-02 MED ORDER — AZITHROMYCIN 250 MG PO TABS: ORAL_TABLET | ORAL | Status: DC

## 2016-03-02 MED ORDER — LEVOFLOXACIN 500 MG PO TABS: 500.0000 mg | ORAL_TABLET | Freq: Every day | ORAL | Status: DC

## 2016-03-02 NOTE — Patient Instructions (Addendum)
Because you received an x-ray today, you will receive an invoice from [facility]. Please contact [facility] at [phone] with questions or concerns regarding your invoice. Our billing staff will not be able to assist you with those questions. Acute Bronchitis Bronchitis is inflammation of the airways that extend from the windpipe into the lungs (bronchi). The inflammation often causes mucus to develop. This leads to a cough, which is the most common symptom of bronchitis.  In acute bronchitis, the condition usually develops suddenly and goes away over time, usually in a couple weeks. Smoking, allergies, and asthma can make bronchitis worse. Repeated episodes of bronchitis may cause further lung problems.  CAUSES Acute bronchitis is most often caused by the same virus that causes a cold. The virus can spread from person to person (contagious) through coughing, sneezing, and touching contaminated objects. SIGNS AND SYMPTOMS   Cough.   Fever.   Coughing up mucus.   Body aches.   Chest congestion.   Chills.   Shortness of breath.   Sore throat.  DIAGNOSIS  Acute bronchitis is usually diagnosed through a physical exam. Your health care provider will also ask you questions about your medical history. Tests, such as chest X-rays, are sometimes done to rule out other conditions.  TREATMENT  Acute bronchitis usually goes away in a couple weeks. Oftentimes, no medical treatment is necessary. Medicines are sometimes given for relief of fever or cough. Antibiotic medicines are usually not needed but may be prescribed in certain situations. In some cases, an inhaler may be recommended to help reduce shortness of breath and control the cough. A cool mist vaporizer may also be used to help thin bronchial secretions and make it easier to clear the chest.  HOME CARE INSTRUCTIONS  Get plenty of rest.   Drink enough fluids to keep your urine clear or pale yellow (unless  you have a medical condition that requires fluid restriction). Increasing fluids may help thin your respiratory secretions (sputum) and reduce chest congestion, and it will prevent dehydration.   Take medicines only as directed by your health care provider.  If you were prescribed an antibiotic medicine, finish it all even if you start to feel better.  Avoid smoking and secondhand smoke. Exposure to cigarette smoke or irritating chemicals will make bronchitis worse. If you are a smoker, consider using nicotine gum or skin patches to help control withdrawal symptoms. Quitting smoking will help your lungs heal faster.   Reduce the chances of another bout of acute bronchitis by washing your hands frequently, avoiding people with cold symptoms, and trying not to touch your hands to your mouth, nose, or eyes.   Keep all follow-up visits as directed by your health care provider.  SEEK MEDICAL CARE IF: Your symptoms do not improve after 1 week of treatment.  SEEK IMMEDIATE MEDICAL CARE IF:  You develop an increased fever or chills.   You have chest pain.   You have severe shortness of breath.  You have bloody sputum.   You develop dehydration.  You faint or repeatedly feel like you are going to pass out.  You develop repeated vomiting.  You develop a severe headache. MAKE SURE YOU:   Understand these instructions.  Will watch your condition.  Will get help right away if you are not doing well or get worse.   This information is not intended to replace advice given to you by your health care provider. Make sure you discuss any questions you have with your   health care provider.   Document Released: 01/19/2005 Document Revised: 01/02/2015 Document Reviewed: 06/04/2013 Elsevier Interactive Patient Education 2016 [employer]vier Inc.  

## 2016-03-02 NOTE — Progress Notes (Addendum)
55 yo woman who has been sick since February 23.  She started coughing, and was given a Z pak first, followed by amoxicillin.  She continues to suffer from painful cough.  Fever continues off and on.  She feels tired, has a sore throat and sore left lateral ribs..  She notes the cough is worse when supine.  She had a flu shot.  She has a h/o pneumonia.  Fluctuating low grade temperature.  She works for the [employer].  Objective:  Alert and in no acute distress BP 112/76 mmHg  Pulse 72  Temp(Src) 98.5 F (36.9 C)  Resp 16  Ht 5\' 5"  (1.651 m)  Wt 173 lb (78.472 kg)  BMI 28.79 kg/m2  SpO2 98%  LMP 05/21/2013 HEENT:  Unremarkable except swollen nasal passages with blood tinged mucous Neck:  Supple, no adenopathy Chest:  Bilateral ronchi Heart:  Reg, no murmur Chest x-ray:   Linear streaking in the right lower lobe consistent with atelectasis or early infiltrate  Cough - Plan: DG Chest 2 View, chlorpheniramine-HYDROcodone (TUSSIONEX PENNKINETIC ER) 10-8 MG/5ML SUER, levofloxacin (LEVAQUIN) 500 MG tablet, DISCONTINUED: azithromycin (ZITHROMAX) 250 MG tablet  Chest wall pain - Plan: DG Chest 2 View, chlorpheniramine-HYDROcodone (TUSSIONEX PENNKINETIC ER) 10-8 MG/5ML SUER, levofloxacin (LEVAQUIN) 500 MG tablet, DISCONTINUED: azithromycin (ZITHROMAX) 250 MG tablet   Robyn Haber

## 2016-03-02 NOTE — Addendum Note (Signed)
Addended by: Robyn Haber on: 03/02/2016 01:46 PM   Modules accepted: Orders

## 2016-03-03 ENCOUNTER — Encounter (HOSPITAL_COMMUNITY): Payer: Self-pay | Admitting: General Practice

## 2016-03-03 ENCOUNTER — Observation Stay (HOSPITAL_COMMUNITY)
Admission: AD | Admit: 2016-03-03 | Discharge: 2016-03-06 | Disposition: A | Discharge status: Home / Self Care | Payer: BLUE CROSS/BLUE SHIELD | Source: Ambulatory Visit | Attending: Internal Medicine | Admitting: Internal Medicine

## 2016-03-03 DIAGNOSIS — J069 Acute upper respiratory infection, unspecified: Secondary | ICD-10-CM | POA: Diagnosis not present

## 2016-03-03 DIAGNOSIS — N183 Chronic kidney disease, stage 3 unspecified: Secondary | ICD-10-CM | POA: Diagnosis present

## 2016-03-03 DIAGNOSIS — E1122 Type 2 diabetes mellitus with diabetic chronic kidney disease: Principal | ICD-10-CM | POA: Insufficient documentation

## 2016-03-03 DIAGNOSIS — E1165 Type 2 diabetes mellitus with hyperglycemia: Secondary | ICD-10-CM | POA: Insufficient documentation

## 2016-03-03 DIAGNOSIS — R739 Hyperglycemia, unspecified: Secondary | ICD-10-CM | POA: Diagnosis present

## 2016-03-03 DIAGNOSIS — K219 Gastro-esophageal reflux disease without esophagitis: Secondary | ICD-10-CM | POA: Diagnosis not present

## 2016-03-03 DIAGNOSIS — E118 Type 2 diabetes mellitus with unspecified complications: Secondary | ICD-10-CM | POA: Diagnosis not present

## 2016-03-03 DIAGNOSIS — J45909 Unspecified asthma, uncomplicated: Secondary | ICD-10-CM | POA: Insufficient documentation

## 2016-03-03 DIAGNOSIS — I129 Hypertensive chronic kidney disease with stage 1 through stage 4 chronic kidney disease, or unspecified chronic kidney disease: Secondary | ICD-10-CM | POA: Diagnosis not present

## 2016-03-03 DIAGNOSIS — Z794 Long term (current) use of insulin: Secondary | ICD-10-CM | POA: Diagnosis not present

## 2016-03-03 DIAGNOSIS — M797 Fibromyalgia: Secondary | ICD-10-CM | POA: Diagnosis present

## 2016-03-03 DIAGNOSIS — I10 Essential (primary) hypertension: Secondary | ICD-10-CM | POA: Diagnosis present

## 2016-03-03 DIAGNOSIS — F419 Anxiety disorder, unspecified: Secondary | ICD-10-CM | POA: Diagnosis not present

## 2016-03-03 DIAGNOSIS — Z23 Encounter for immunization: Secondary | ICD-10-CM | POA: Insufficient documentation

## 2016-03-03 DIAGNOSIS — I152 Hypertension secondary to endocrine disorders: Secondary | ICD-10-CM | POA: Diagnosis present

## 2016-03-03 DIAGNOSIS — J189 Pneumonia, unspecified organism: Secondary | ICD-10-CM | POA: Diagnosis present

## 2016-03-03 DIAGNOSIS — N289 Disorder of kidney and ureter, unspecified: Secondary | ICD-10-CM

## 2016-03-03 DIAGNOSIS — Z87891 Personal history of nicotine dependence: Secondary | ICD-10-CM | POA: Insufficient documentation

## 2016-03-03 HISTORY — DX: Other chronic pain: G89.29

## 2016-03-03 HISTORY — DX: Low back pain: M54.5

## 2016-03-03 HISTORY — DX: Low back pain, unspecified: M54.50

## 2016-03-03 HISTORY — DX: Headache, unspecified: R51.9

## 2016-03-03 HISTORY — DX: Headache: R51

## 2016-03-03 HISTORY — DX: Chronic kidney disease, stage 3 unspecified: N18.30

## 2016-03-03 HISTORY — DX: Type 2 diabetes mellitus with ketoacidosis without coma: E11.10

## 2016-03-03 HISTORY — DX: Pneumonia, unspecified organism: J18.9

## 2016-03-03 HISTORY — DX: Chronic kidney disease, stage 3 (moderate): N18.3

## 2016-03-03 HISTORY — DX: Disorder of kidney and ureter, unspecified: N28.9

## 2016-03-03 HISTORY — DX: Gastro-esophageal reflux disease without esophagitis: K21.9

## 2016-03-03 LAB — CBC WITH DIFFERENTIAL/PLATELET
BASOS ABS: 0 10*3/uL (ref 0.0–0.1)
Basophils Relative: 0 %
EOS PCT: 1 %
Eosinophils Absolute: 0.1 10*3/uL (ref 0.0–0.7)
HEMATOCRIT: 39.4 % (ref 36.0–46.0)
Hemoglobin: 13.3 g/dL (ref 12.0–15.0)
LYMPHS PCT: 53 %
Lymphs Abs: 4.4 10*3/uL — ABNORMAL HIGH (ref 0.7–4.0)
MCH: 30.6 pg (ref 26.0–34.0)
MCHC: 33.8 g/dL (ref 30.0–36.0)
MCV: 90.6 fL (ref 78.0–100.0)
Monocytes Absolute: 0.4 10*3/uL (ref 0.1–1.0)
Monocytes Relative: 5 %
Neutro Abs: 3.3 10*3/uL (ref 1.7–7.7)
Neutrophils Relative %: 41 %
PLATELETS: 298 10*3/uL (ref 150–400)
RBC: 4.35 MIL/uL (ref 3.87–5.11)
RDW: 13.3 % (ref 11.5–15.5)
WBC: 8.1 10*3/uL (ref 4.0–10.5)

## 2016-03-03 LAB — COMPREHENSIVE METABOLIC PANEL
ALT: 20 U/L (ref 14–54)
ANION GAP: 14 (ref 5–15)
AST: 19 U/L (ref 15–41)
Albumin: 3.7 g/dL (ref 3.5–5.0)
Alkaline Phosphatase: 108 U/L (ref 38–126)
BUN: 23 mg/dL — ABNORMAL HIGH (ref 6–20)
CO2: 22 mmol/L (ref 22–32)
Calcium: 9.5 mg/dL (ref 8.9–10.3)
Chloride: 100 mmol/L — ABNORMAL LOW (ref 101–111)
Creatinine, Ser: 1.23 mg/dL — ABNORMAL HIGH (ref 0.44–1.00)
GFR calc non Af Amer: 49 mL/min — ABNORMAL LOW (ref >60)
GFR, EST AFRICAN AMERICAN: 57 mL/min — AB (ref >60)
Glucose, Bld: 315 mg/dL — ABNORMAL HIGH (ref 65–99)
Potassium: 4.6 mmol/L (ref 3.5–5.1)
SODIUM: 136 mmol/L (ref 135–145)
Total Bilirubin: 0.3 mg/dL (ref 0.3–1.2)
Total Protein: 7.1 g/dL (ref 6.5–8.1)

## 2016-03-03 LAB — INFLUENZA PANEL BY PCR (TYPE A & B)
H1N1 flu by pcr: NOT DETECTED
Influenza A By PCR: NEGATIVE
Influenza B By PCR: NEGATIVE

## 2016-03-03 LAB — GLUCOSE, CAPILLARY
GLUCOSE-CAPILLARY: 135 mg/dL — AB (ref 65–99)
GLUCOSE-CAPILLARY: 265 mg/dL — AB (ref 65–99)
GLUCOSE-CAPILLARY: 302 mg/dL — AB (ref 65–99)
GLUCOSE-CAPILLARY: 349 mg/dL — AB (ref 65–99)
Glucose-Capillary: 184 mg/dL — ABNORMAL HIGH (ref 65–99)

## 2016-03-03 LAB — TSH: TSH: 1.612 u[IU]/mL (ref 0.350–4.500)

## 2016-03-03 MED ORDER — NEBIVOLOL HCL 5 MG PO TABS
5.0000 mg | ORAL_TABLET | Freq: Every day | ORAL | Status: DC
  Administered 2016-03-04 – 2016-03-06 (×3): 5 mg via ORAL
  Filled 2016-03-03 (×3): qty 1

## 2016-03-03 MED ORDER — NORETHIN ACE-ETH ESTRAD-FE 1-20 MG-MCG PO TABS: 1.0000 | ORAL_TABLET | Freq: Every day | ORAL | Status: DC

## 2016-03-03 MED ORDER — TRAMADOL HCL 50 MG PO TABS
50.0000 mg | ORAL_TABLET | Freq: Four times a day (QID) | ORAL | Status: DC | PRN
Start: 2016-03-03 — End: 2016-03-06

## 2016-03-03 MED ORDER — IPRATROPIUM-ALBUTEROL 0.5-2.5 (3) MG/3ML IN SOLN: 3.0000 mL | Freq: Four times a day (QID) | RESPIRATORY_TRACT | Status: DC | PRN

## 2016-03-03 MED ORDER — PANTOPRAZOLE SODIUM 40 MG PO TBEC
40.0000 mg | DELAYED_RELEASE_TABLET | Freq: Every day | ORAL | Status: DC
  Administered 2016-03-03 – 2016-03-06 (×4): 40 mg via ORAL
  Filled 2016-03-03 (×4): qty 1

## 2016-03-03 MED ORDER — BUDESONIDE 0.25 MG/2ML IN SUSP
0.2500 mg | Freq: Two times a day (BID) | RESPIRATORY_TRACT | Status: DC
Start: 2016-03-03 — End: 2016-03-06
  Administered 2016-03-03 – 2016-03-06 (×6): 0.25 mg via RESPIRATORY_TRACT
  Filled 2016-03-03 (×6): qty 2

## 2016-03-03 MED ORDER — TOPIRAMATE 25 MG PO TABS
50.0000 mg | ORAL_TABLET | Freq: Every day | ORAL | Status: DC
  Administered 2016-03-05: 50 mg via ORAL
  Filled 2016-03-03 (×2): qty 2

## 2016-03-03 MED ORDER — DEXTROSE-NACL 5-0.45 % IV SOLN
INTRAVENOUS | Status: DC
  Administered 2016-03-03: 23:00:00 via INTRAVENOUS

## 2016-03-03 MED ORDER — LIDOCAINE 5 % EX OINT: 1.0000 "application " | TOPICAL_OINTMENT | Freq: Every day | CUTANEOUS | Status: DC | PRN

## 2016-03-03 MED ORDER — DEXTROSE 50 % IV SOLN: 25.0000 mL | INTRAVENOUS | Status: DC | PRN

## 2016-03-03 MED ORDER — FLUTICASONE PROPIONATE 50 MCG/ACT NA SUSP
2.0000 | Freq: Every day | NASAL | Status: DC
  Administered 2016-03-04 – 2016-03-06 (×3): 2 via NASAL
  Filled 2016-03-03: qty 16

## 2016-03-03 MED ORDER — HYDROCOD POLST-CPM POLST ER 10-8 MG/5ML PO SUER
5.0000 mL | Freq: Two times a day (BID) | ORAL | Status: DC | PRN
  Administered 2016-03-03 – 2016-03-05 (×4): 5 mL via ORAL
  Filled 2016-03-03 (×6): qty 5

## 2016-03-03 MED ORDER — LEVOFLOXACIN 500 MG PO TABS
500.0000 mg | ORAL_TABLET | Freq: Every day | ORAL | Status: DC
  Administered 2016-03-03 – 2016-03-06 (×4): 500 mg via ORAL
  Filled 2016-03-03 (×5): qty 1

## 2016-03-03 MED ORDER — HYDROCODONE-ACETAMINOPHEN 7.5-325 MG PO TABS
1.0000 | ORAL_TABLET | Freq: Four times a day (QID) | ORAL | Status: DC | PRN
  Administered 2016-03-05: 1 via ORAL
  Filled 2016-03-03: qty 1

## 2016-03-03 MED ORDER — PNEUMOCOCCAL VAC POLYVALENT 25 MCG/0.5ML IJ INJ
0.5000 mL | INJECTION | INTRAMUSCULAR | Status: AC
  Administered 2016-03-05: 0.5 mL via INTRAMUSCULAR
  Filled 2016-03-03: qty 0.5

## 2016-03-03 MED ORDER — INSULIN REGULAR BOLUS VIA INFUSION
0.0000 [IU] | Freq: Three times a day (TID) | INTRAVENOUS | Status: DC
  Filled 2016-03-03: qty 10

## 2016-03-03 MED ORDER — SODIUM CHLORIDE 0.9 % IV SOLN
INTRAVENOUS | Status: DC
  Administered 2016-03-03: 21:00:00 via INTRAVENOUS

## 2016-03-03 MED ORDER — INSULIN REGULAR HUMAN 100 UNIT/ML IJ SOLN
INTRAMUSCULAR | Status: DC
  Administered 2016-03-03: 2.4 [IU]/h via INTRAVENOUS
  Filled 2016-03-03: qty 2.5

## 2016-03-03 MED ORDER — DILTIAZEM HCL ER COATED BEADS 180 MG PO CP24
360.0000 mg | ORAL_CAPSULE | Freq: Every day | ORAL | Status: DC
  Administered 2016-03-04 – 2016-03-06 (×3): 360 mg via ORAL
  Filled 2016-03-03 (×3): qty 2

## 2016-03-03 MED ORDER — LORATADINE 10 MG PO TABS
10.0000 mg | ORAL_TABLET | Freq: Every evening | ORAL | Status: DC
  Administered 2016-03-03 – 2016-03-05 (×3): 10 mg via ORAL
  Filled 2016-03-03 (×3): qty 1

## 2016-03-03 MED ORDER — MONTELUKAST SODIUM 10 MG PO TABS
10.0000 mg | ORAL_TABLET | Freq: Every day | ORAL | Status: DC
  Administered 2016-03-03 – 2016-03-05 (×3): 10 mg via ORAL
  Filled 2016-03-03 (×3): qty 1

## 2016-03-03 MED ORDER — CARISOPRODOL 350 MG PO TABS
350.0000 mg | ORAL_TABLET | Freq: Three times a day (TID) | ORAL | Status: DC
  Administered 2016-03-03 – 2016-03-06 (×8): 350 mg via ORAL
  Filled 2016-03-03 (×8): qty 1

## 2016-03-03 MED ORDER — GABAPENTIN 100 MG PO CAPS
100.0000 mg | ORAL_CAPSULE | Freq: Every evening | ORAL | Status: DC
  Administered 2016-03-03 – 2016-03-05 (×3): 100 mg via ORAL
  Filled 2016-03-03 (×3): qty 1

## 2016-03-03 MED ORDER — ALPRAZOLAM 0.5 MG PO TABS
1.0000 mg | ORAL_TABLET | Freq: Every evening | ORAL | Status: DC | PRN
  Administered 2016-03-03 – 2016-03-05 (×3): 1 mg via ORAL
  Filled 2016-03-03 (×3): qty 2

## 2016-03-03 NOTE — H&P (Signed)
History and Physical    Maria [name] [id number] DOB: 04/16/1961 DOA: 03/03/2016  Referring MD/NP/PA: Dr. Marlou Sa PCP - direct admit PCP: Rogers Blocker, MD Patient coming from: Home  Chief Complaint: High blood sugars  HPI: Maria [name] is a 55 y.o. female with medical history significant of asthma, hypertension, anxiety, fibromyalgia, presents as a direct admit to the hospital as she was found to have elevated blood sugars in office yesterday. Patient has been having a 3 week long history of an upper respiratory infection for which she saw her PCP several times. She was initially placed on azithromycin, did not really improve, she was transitioned to amoxicillin, and yesterday she was started on Levaquin for ongoing upper respiratory infection. She was seen yesterday, she underwent blood work which showed a blood glucose of 524, hemoglobin A1c of 13, bicarbonate of 19, creatinine at baseline around 1.4, she also had a gap of 15. Patient endorses increased thirst over the last few weeks (states that she always has lots of months of water anyway), increased urination, and she also endorses 7 pound weight loss over the last 2 months, unintentional. She denies any fever or chills or muscle aches. She endorses chest pain worse with coughing, she endorses mild shortness of breath. She has a productive cough which has been going on for 3 weeks. She has been a smoker, quit smoking 4 weeks ago. She smoked about 4 cigarettes per day since age of 32. She denies any abdominal pain, nausea, vomiting or diarrhea. TRH was asked to admit for uncontrolled blood sugars.  Review of Systems: As per HPI otherwise 10 point ROS negative.  Past Medical History  Diagnosis Date  . Fibroid   . Pelvic pain in female   . Fibromyalgia   . Anxiety   . PONV (postoperative nausea and vomiting)   . Asthma   . HTN (hypertension)    Past Surgical History  Procedure Laterality Date  . Dilation and curettage of  uterus     Social History:  reports that she has been smoking Cigarettes.  She has been smoking about 0.25 packs per day. She has never used smokeless tobacco. She reports that she does not drink alcohol or use illicit drugs.  Allergies  Allergen Reactions  . Advair Diskus [Fluticasone-Salmeterol] Palpitations  . Other Palpitations    "Muscle relaxer that starts with an M"    Family History  Problem Relation Age of Onset  . Cancer Mother   . Cancer Brother   . Hypertension Brother     Prior to Admission medications   Medication Sig Start Date End Date Taking? Authorizing Provider  ALPRAZolam Duanne Moron) 1 MG tablet Take 1 mg by mouth at bedtime as needed for sleep or anxiety.   Yes Historical Provider, MD  ARNUITY ELLIPTA 100 MCG/ACT AEPB Inhale 1 puff into the lungs daily. 12/26/15  Yes Historical Provider, MD  carisoprodol (SOMA) 250 MG tablet Take 350 mg by mouth 3 (three) times daily.    Yes Historical Provider, MD  chlorpheniramine-HYDROcodone (TUSSIONEX PENNKINETIC ER) 10-8 MG/5ML SUER Take 5 mLs by mouth every 12 (twelve) hours as needed. Patient taking differently: Take 5 mLs by mouth every 12 (twelve) hours as needed for cough.  03/02/16  Yes Robyn Haber, MD  cholecalciferol (VITAMIN D) 1000 UNITS tablet Take 1,000 Units by mouth daily.   Yes Historical Provider, MD  dexlansoprazole (DEXILANT) 60 MG capsule Take 60 mg by mouth daily.   Yes Historical Provider, MD  diltiazem (TIAZAC) 300 MG 24 hr capsule Take 300 mg by mouth daily with breakfast.    Yes Historical Provider, MD  estazolam (PROSOM) 2 MG tablet Take 2 mg by mouth at bedtime.   Yes Historical Provider, MD  fish oil-omega-3 fatty acids 1000 MG capsule Take 2 g by mouth daily.   Yes Historical Provider, MD  fluticasone (FLONASE) 50 MCG/ACT nasal spray Place 2 sprays into the nose daily.   Yes Historical Provider, MD  gabapentin (NEURONTIN) 100 MG capsule Take 100 mg by mouth every evening. Reported on 03/03/2016   Yes  Historical Provider, MD  HYDROcodone-acetaminophen (NORCO) 7.5-325 MG per tablet Take 1 tablet by mouth every 6 (six) hours as needed for pain.   Yes Historical Provider, MD  levocetirizine (XYZAL) 5 MG tablet Take 5 mg by mouth every evening.   Yes Historical Provider, MD  levofloxacin (LEVAQUIN) 500 MG tablet Take 1 tablet (500 mg total) by mouth daily. 03/02/16  Yes Robyn Haber, MD  lidocaine (XYLOCAINE) 5 % ointment Apply 1 application topically daily. Reported on 03/02/2016   Yes Historical Provider, MD  montelukast (SINGULAIR) 10 MG tablet Take 10 mg by mouth at bedtime.   Yes Historical Provider, MD  Multiple Vitamin (MULTIVITAMIN WITH MINERALS) TABS Take 1 tablet by mouth daily.   Yes Historical Provider, MD  nebivolol (BYSTOLIC) 10 MG tablet Take 5 mg by mouth daily.   Yes Historical Provider, MD  topiramate (TOPAMAX) 50 MG tablet Take 50 mg by mouth at bedtime. Reported on 03/02/2016   Yes Historical Provider, MD  traMADol (ULTRAM) 50 MG tablet Take 50 mg by mouth every 6 (six) hours as needed.   Yes Historical Provider, MD  triamterene-hydrochlorothiazide (DYAZIDE) 37.5-25 MG capsule Take 1 capsule by mouth daily.   Yes Historical Provider, MD  norethindrone-ethinyl estradiol (JUNEL FE 1/20) 1-20 MG-MCG tablet Take 1 tablet by mouth daily. 08/07/12 08/07/13  Crawford Givens, MD   Physical Exam: Filed Vitals:   03/03/16 1726  BP: 128/97  Pulse: 57  Temp: 97.9 F (36.6 C)  Resp: 16  SpO2: 96%     GENERAL: NAD  HEENT: head NCAT, no scleral icterus. Pupils round and reactive.   NECK: Supple. No carotid bruits.   LUNGS: Clear to auscultation. No wheezing or crackles. Coarse breath sounds which cleared upon coughing  HEART: Regular rate and rhythm without murmur. 2+ pulses, no JVD, no peripheral edema  ABDOMEN: Soft, nontender, and nondistended. Positive bowel sounds.   EXTREMITIES: Without any cyanosis, clubbing, rash or lesions.  NEUROLOGIC: Alert and oriented x3. Cranial  nerves II through XII are grossly intact. Strength 5/5 in all 4.  PSYCHIATRIC: Normal mood and affect  SKIN: No ulceration or induration present.   Labs on Admission:  Basic Metabolic Panel: No results for input(s): NA, K, CL, CO2, GLUCOSE, BUN, CREATININE, CALCIUM, MG, PHOS in the last 168 hours. Liver Function Tests: No results for input(s): AST, ALT, ALKPHOS, BILITOT, PROT, ALBUMIN in the last 168 hours. No results for input(s): LIPASE, AMYLASE in the last 168 hours. No results for input(s): AMMONIA in the last 168 hours. CBC: No results for input(s): WBC, NEUTROABS, HGB, HCT, MCV, PLT in the last 168 hours. Cardiac Enzymes: No results for input(s): CKTOTAL, CKMB, CKMBINDEX, TROPONINI in the last 168 hours.  BNP (last 3 results) No results for input(s): BNP in the last 8760 hours.  ProBNP (last 3 results) No results for input(s): PROBNP in the last 8760 hours.  CBG: No results for input(s):  GLUCAP in the last 168 hours.  Radiological Exams on Admission: Dg Chest 2 View  03/02/2016  CLINICAL DATA:  Cough, congestion EXAM: CHEST  2 VIEW COMPARISON:  None. FINDINGS: No active infiltrate or effusion is seen. Mediastinal and hilar contours are unremarkable. The heart is within normal limits in size. No bony abnormality is seen. IMPRESSION: No active cardiopulmonary disease. Electronically Signed   By: Ivar Drape M.D.   On: 03/02/2016 13:10    Assessment/Plan Active Problems:   Renal insufficiency   CAP (community acquired pneumonia)   GERD (gastroesophageal reflux disease)   HTN (hypertension)   Fibromyalgia syndrome   Hyperglycemia   CKD (chronic kidney disease), stage III  Hyperglycemia in the setting of newly diagnosed diabetes  - Currently I have no blood work available for today, I don't know if the patient is in DKA or just has mild hyperglycemia. She doesn't have any GI symptoms of nausea or vomiting which suggests DKA. However to be cautious we'll keep patient  nothing by mouth until blood work is obtained. Bedside CBG showed a glucose of 350 which is reassuring. Start glucose stabilizer and keep NPO - patient may be transitioned to subcutaneous insulin once blood work is available, if she has no anion gap,and can be allowed to eat based on blood work. Will sign out to the night team  -  her hemoglobin A1c was reportedly at 13, she would likely need insulin on discharge, however patient is quite resistant to that and would prefer not to  URI - Continue Levaquin, this was just started yesterday. She had a CXR yesterday which showed no active cardiopulmonary disease, will not repeat. - r/o influenza  Asthma, without exacerbation - She has no wheezing, she is not short of breath and moves air well. - Duonebs as needed, Pulmicort - No need for IV steroids  Chronic kidney disease stage III - Creatinine yesterday was at baseline, unknown value today, BMP is pending  Fibromyalgia/anxiety/GERD  - Resume her home medications for her other medical problems   DVT Prophylaxis: SCD  Code Status: Full code   Family Communication: No family at bedside   Disposition Plan: home when ready  Consults called: none  Admission status: obs  Costin M. Cruzita Lederer, MD [facility] Pager [phone]  If 7PM-7AM, please contact night-coverage [email or web address] Password [facility] 03/03/2016, 5:56 PM

## 2016-03-04 DIAGNOSIS — E1122 Type 2 diabetes mellitus with diabetic chronic kidney disease: Secondary | ICD-10-CM | POA: Diagnosis not present

## 2016-03-04 DIAGNOSIS — N183 Chronic kidney disease, stage 3 (moderate): Secondary | ICD-10-CM | POA: Diagnosis not present

## 2016-03-04 DIAGNOSIS — R739 Hyperglycemia, unspecified: Secondary | ICD-10-CM | POA: Diagnosis not present

## 2016-03-04 DIAGNOSIS — J189 Pneumonia, unspecified organism: Secondary | ICD-10-CM | POA: Diagnosis not present

## 2016-03-04 LAB — GLUCOSE, CAPILLARY
GLUCOSE-CAPILLARY: 133 mg/dL — AB (ref 65–99)
GLUCOSE-CAPILLARY: 204 mg/dL — AB (ref 65–99)
GLUCOSE-CAPILLARY: 191 mg/dL — AB (ref 65–99)
GLUCOSE-CAPILLARY: 110 mg/dL — AB (ref 65–99)
GLUCOSE-CAPILLARY: 114 mg/dL — AB (ref 65–99)
GLUCOSE-CAPILLARY: 350 mg/dL — AB (ref 65–99)
Glucose-Capillary: 156 mg/dL — ABNORMAL HIGH (ref 65–99)
Glucose-Capillary: 181 mg/dL — ABNORMAL HIGH (ref 65–99)
Glucose-Capillary: 165 mg/dL — ABNORMAL HIGH (ref 65–99)
Glucose-Capillary: 98 mg/dL (ref 65–99)
Glucose-Capillary: 234 mg/dL — ABNORMAL HIGH (ref 65–99)
Glucose-Capillary: 225 mg/dL — ABNORMAL HIGH (ref 65–99)

## 2016-03-04 LAB — BASIC METABOLIC PANEL
Anion gap: 11 (ref 5–15)
Anion gap: 13 (ref 5–15)
Anion gap: 10 (ref 5–15)
BUN: 21 mg/dL — AB (ref 6–20)
BUN: 16 mg/dL (ref 6–20)
BUN: 20 mg/dL (ref 6–20)
CHLORIDE: 107 mmol/L (ref 101–111)
CO2: 20 mmol/L — ABNORMAL LOW (ref 22–32)
CO2: 21 mmol/L — ABNORMAL LOW (ref 22–32)
CO2: 20 mmol/L — ABNORMAL LOW (ref 22–32)
CREATININE: 1.09 mg/dL — AB (ref 0.44–1.00)
CREATININE: 1.17 mg/dL — AB (ref 0.44–1.00)
Calcium: 9.3 mg/dL (ref 8.9–10.3)
Calcium: 9.1 mg/dL (ref 8.9–10.3)
Calcium: 9.5 mg/dL (ref 8.9–10.3)
Chloride: 103 mmol/L (ref 101–111)
Chloride: 109 mmol/L (ref 101–111)
Creatinine, Ser: 1.19 mg/dL — ABNORMAL HIGH (ref 0.44–1.00)
GFR calc Af Amer: >60 mL/min (ref >60)
GFR calc Af Amer: >60 mL/min (ref >60)
GFR calc Af Amer: 59 mL/min — ABNORMAL LOW (ref >60)
GFR calc non Af Amer: 51 mL/min — ABNORMAL LOW (ref >60)
GFR, EST NON AFRICAN AMERICAN: 57 mL/min — AB (ref >60)
GFR, EST NON AFRICAN AMERICAN: 52 mL/min — AB (ref >60)
GLUCOSE: 130 mg/dL — AB (ref 65–99)
GLUCOSE: 203 mg/dL — AB (ref 65–99)
Glucose, Bld: 127 mg/dL — ABNORMAL HIGH (ref 65–99)
POTASSIUM: 3.9 mmol/L (ref 3.5–5.1)
POTASSIUM: 3.9 mmol/L (ref 3.5–5.1)
Potassium: 4 mmol/L (ref 3.5–5.1)
SODIUM: 138 mmol/L (ref 135–145)
SODIUM: 137 mmol/L (ref 135–145)
Sodium: 139 mmol/L (ref 135–145)

## 2016-03-04 LAB — CBC
HCT: 38.1 % (ref 36.0–46.0)
Hemoglobin: 13.2 g/dL (ref 12.0–15.0)
MCH: 31.6 pg (ref 26.0–34.0)
MCHC: 34.6 g/dL (ref 30.0–36.0)
MCV: 91.1 fL (ref 78.0–100.0)
PLATELETS: 295 10*3/uL (ref 150–400)
RBC: 4.18 MIL/uL (ref 3.87–5.11)
RDW: 13.5 % (ref 11.5–15.5)
WBC: 8.3 10*3/uL (ref 4.0–10.5)

## 2016-03-04 MED ORDER — GLUCERNA SHAKE PO LIQD
237.0000 mL | Freq: Two times a day (BID) | ORAL | Status: DC
  Administered 2016-03-04 – 2016-03-06 (×4): 237 mL via ORAL

## 2016-03-04 MED ORDER — INSULIN ASPART 100 UNIT/ML ~~LOC~~ SOLN
0.0000 [IU] | Freq: Three times a day (TID) | SUBCUTANEOUS | Status: DC
  Administered 2016-03-04 – 2016-03-05 (×3): 3 [IU] via SUBCUTANEOUS
  Administered 2016-03-05: 7 [IU] via SUBCUTANEOUS
  Administered 2016-03-05: 9 [IU] via SUBCUTANEOUS
  Administered 2016-03-06 (×2): 5 [IU] via SUBCUTANEOUS
  Administered 2016-03-06: 7 [IU] via SUBCUTANEOUS

## 2016-03-04 MED ORDER — INSULIN ASPART 100 UNIT/ML ~~LOC~~ SOLN
0.0000 [IU] | Freq: Every day | SUBCUTANEOUS | Status: DC
  Administered 2016-03-04: 4 [IU] via SUBCUTANEOUS
  Administered 2016-03-05: 2 [IU] via SUBCUTANEOUS

## 2016-03-04 MED ORDER — LIVING WELL WITH DIABETES BOOK
Freq: Once | Status: AC
  Administered 2016-03-04: 1
  Filled 2016-03-04: qty 1

## 2016-03-04 MED ORDER — INSULIN GLARGINE 100 UNIT/ML ~~LOC~~ SOLN
15.0000 [IU] | Freq: Every day | SUBCUTANEOUS | Status: DC
  Administered 2016-03-04: 15 [IU] via SUBCUTANEOUS
  Filled 2016-03-04 (×2): qty 0.15

## 2016-03-04 MED ORDER — INSULIN STARTER KIT- PEN NEEDLES (ENGLISH)
1.0000 | Freq: Once | Status: AC
  Administered 2016-03-05: 1
  Filled 2016-03-04: qty 1

## 2016-03-04 NOTE — Progress Notes (Signed)
Initial Nutrition Assessment  DOCUMENTATION CODES:   Not applicable  INTERVENTION:  Provide Glucerna Shake po BID, each supplement provides 220 kcal and 10 grams of protein.  Encourage adequate PO intake.   NUTRITION DIAGNOSIS:   Inadequate oral intake related to poor appetite as evidenced by meal completion < 50%.  GOAL:   Patient will meet greater than or equal to 90% of their needs  MONITOR:   PO intake, Supplement acceptance, Weight trends, Labs, I & O's  REASON FOR ASSESSMENT:   Consult, Malnutrition Screening Tool Diet education  ASSESSMENT:   55 y.o. female with medical history significant of asthma, hypertension, anxiety, fibromyalgia, presents as a direct admit to the hospital as she was found to have elevated blood sugars in office yesterday. Patient has been having a 3 week long history of an upper respiratory infection .  Pt reports having a decreased appetite which has been ongoing over the past 3 month. She reports consuming only 1-2 meals a day. No recent meal completion recorded, however RD observed tray in room which revealed ~50% intake. Pt reports a 7 lb weight loss in the last 3 weeks. Pt with a 3.8% weight loss (not found significant). Pt is agreeable to Glucerna shake to aid in caloric and protein needs. Will order. RD additionally consulted for a diet education. RD to provide.  Pt with no observed significant fat or muscle mass loss.   Labs and medications reviewed.   Diet Order:  Diet Carb Modified Fluid consistency:: Thin; Room service appropriate?: Yes  Skin:  Reviewed, no issues  Last BM:  3/7  Height:   Ht Readings from Last 1 Encounters:  03/03/16 5\' 5"  (1.651 m)    Weight:   Wt Readings from Last 1 Encounters:  03/03/16 173 lb (78.472 kg)    Ideal Body Weight:  56.8 kg  BMI:  Body mass index is 28.79 kg/(m^2).  Estimated Nutritional Needs:   Kcal:  1800-1950  Protein:  80-95 grams  Fluid:  1.8 - 1.9 L/day  EDUCATION  NEEDS:   Education needs addressed  Corrin Parker, MS, RD, LDN Pager # [phone] After hours/ weekend pager # [phone]

## 2016-03-04 NOTE — Progress Notes (Signed)
Pt flu neg. Droplet precaution discontinued per protocol. PDarden Palmer Kevontae Burgoon Rn

## 2016-03-04 NOTE — Progress Notes (Signed)
Spoke with patient about new diabetes diagnosis.  Patient reports that both of her parents and her brother have diabetes and that her mother was on insulin. Patient states that she has been "borderline diabetic" for a while and her PCP checks her blood work frequently to assess glycemic control. Patient reports that she last had her A1C and glucose checked on labs in October and they were considered "borderline" at that time. Patient reports that her glucose was elevated at the doctor's office and she was told her A1C was 13%. Explained that an A1C has been ordered here but is still in process. Discussed A1C results (13% per patient at PCP office) and explained what an A1C is and informed patient that his current A1C indicates an average glucose of 325 mg/dl over the past 2-3 months. Discussed basic pathophysiology of DM Type 2, basic home care, importance of checking CBGs and maintaining good CBG control to prevent long-term and short-term complications. Reviewed glucose and A1C goals and explained that patient will need to monitor glucose and take DM medications as prescribed.  Reviewed signs and symptoms of hyperglycemia and hypoglycemia along with treatment for both. Discussed impact of nutrition, exercise, stress, sickness, and medications on diabetes control. Informed patient that a consult has been placed for RD. Informed patient she should be receiving the Living Well with diabetes booklet and encouraged patient to read through entire book.  Explained that since her A1C was 13% she is likely going to need to be on insulin for diabetes control. Patient is agreeable to take insulin if needed for diabetes control. Discussed currently ordered insulin regimen with Lantus and Novolog and discussed both insulins. Informed patient she will be informed of what insulin(s) and dosages that she will take at time of discharge.  Asked patient to check her glucose 4 times per day (before meals and at bedtime) and to keep a  log book of glucose readings and insulin taken. Explained how her doctor can use the log book to continue to make adjustments if needed with DM medications. Reviewed and demonstrated how to draw up and administer insulin with vial and syringe. Reviewed and demonstrated how to administer insulin with an insulin pen. Patient states that she prefers to use the insulin pen(s). Reviewed all steps of insulin pen including attachment of needle, 2-unit air shot, dialing up dose, giving injection, removing needle, disposal of sharps, storage of unused insulin, disposal of insulin etc. Patient able to provide successful return demonstration without any prompting.  Informed patient that RN will be asking her to self-administer insulin to ensure proper technique and ability to administer self insulin shots.   Patient verbalized understanding of information discussed and she states that she has no further questions at this time related to diabetes.   RNs to provide ongoing basic DM education at bedside with this patient and engage patient to actively check blood glucose and administer insulin injections.   At time of discharge, MD to give patient Rxs for: glucometer and testing supplies, insulin pen(s), and insulin pen needles.   Thanks, Barnie Alderman, RN, MSN, CDE Diabetes Coordinator Inpatient Diabetes Program [phone] (Team Pager from [place] to [place]) [phone] ([place] office) [phone] [facility] office) [phone] [facility] office)

## 2016-03-04 NOTE — Progress Notes (Signed)
PROGRESS NOTE  Maria [name] MRN:[id number] DOB: 09/09/1961 DOA: 03/03/2016 PCP: Eric L Dean, MD Outpatient Specialists:    Brief Narrative: 55 y.o. female with medical history significant of asthma, hypertension, anxiety, fibromyalgia, presents as a direct admit to the hospital as she was found to have elevated blood sugars in office the day prior to admission  Assessment & Plan: Active Problems:   Renal insufficiency   CAP (community acquired pneumonia)   GERD (gastroesophageal reflux disease)   HTN (hypertension)   Fibromyalgia syndrome   Hyperglycemia   CKD (chronic kidney disease), stage III   Newly diagnosed diabetes mellitus - Patient has been having elevated sugars for quite sometime as evidenced by hemoglobin A1c of 13 as an outpatient, hemoglobin A1c here is pending. She likely has some insulin reserves, I think she will do well with once a day long-acting insulin as well as metformin, plus lifestyle changes. Diabetes educator to see today. Discussed with case manager regarding coverage for long-acting insulin. Preferably would need to use spent. Discussed excessively with patient today regarding checking her CBGs 4 times a day daily, she seems determined to do that, and have follow-up with Dr. Dean in couple weeks to review the sugar log. - Her insulin drip was stopped this morning, she was placed on Lantus 15 units, will continue to monitor for the next 24 hours and adjust insulin as needed. Suspect she will be able to go home on Saturday.  URI - continue Levaquin, Nebulizers  Asthma, without exacerbation - She has no wheezing, she is not short of breath and moves air well. - Duonebs as needed, Pulmicort - No need for IV steroids  Chronic kidney disease stage III - Creatinine yesterday was at baseline, unknown value today, BMP is pending  Fibromyalgia/anxiety/GERD  - Resume her home medications for her other medical problems    DVT prophylaxis: SCD Code Status:  Full Family Communication: no family bedside Disposition Plan: home when ready, likely one day Barriers for discharge: Monitor CBGs  Consultants:   None   Procedures:   None   Antimicrobials:  Levaquin   Subjective: - she is feeling better this morning, no chest pain, no shortness of breath   Objective: Filed Vitals:   03/03/16 2152 03/03/16 2156 03/04/16 0604 03/04/16 0947  BP:  116/75 107/53   Pulse:  55 52   Temp:  97.5 F (36.4 C) 98.2 F (36.8 C)   TempSrc:  Axillary Oral   Resp:  16 16   Height:      Weight:      SpO2: 97% 97% 98% 96%    Intake/Output Summary (Last 24 hours) at 03/04/16 1233 Last data filed at 03/04/16 0600  Gross per 24 hour  Intake 1001.67 ml  Output      0 ml  Net 1001.67 ml   Filed Weights   03/03/16 1900  Weight: 78.472 kg (173 lb)    Examination: BP 107/53 mmHg  Pulse 52  Temp(Src) 98.2 F (36.8 C) (Oral)  Resp 16  Ht 5' 5" (1.651 m)  Wt 78.472 kg (173 lb)  BMI 28.79 kg/m2  SpO2 96%  LMP 05/21/2013  GENERAL: NAD  HEENT: head NCAT, no scleral icterus. Pupils round and reactive. Mucous membranes are moist. Posterior pharynx clear of any exudate or lesions.  NECK: Supple. No carotid bruits. No lymphadenopathy or thyromegaly.  LUNGS: No wheezing or crackles, moves air well  HEART: Regular rate and rhythm without murmur. 2+ pulses, no JVD, no peripheral   edema  ABDOMEN: Soft, nontender, and nondistended. Positive bowel sounds.   EXTREMITIES: Without any cyanosis or clubbing  SKIN: No ulceration or induration present. No rashes. No lesions   Data Reviewed: I have personally reviewed following labs and imaging studies  CBC:  Recent Labs Lab 03/03/16 2000 03/04/16 0431  WBC 8.1 8.3  NEUTROABS 3.3  --   HGB 13.3 13.2  HCT 39.4 38.1  MCV 90.6 91.1  PLT 298 573   Basic Metabolic Panel:  Recent Labs Lab 03/03/16 2000 03/04/16 0055 03/04/16 0431 03/04/16 0933  NA 136 138 137 139  K 4.6 3.9 3.9 4.0  CL  100* 107 103 109  CO2 22 20* 21* 20*  GLUCOSE 315* 130* 203* 127*  BUN 23* 21* 20 16  CREATININE 1.23* 1.09* 1.17* 1.19*  CALCIUM 9.5 9.3 9.1 9.5   GFR: Estimated Creatinine Clearance: 56.6 mL/min (by C-G formula based on Cr of 1.19). Liver Function Tests:  Recent Labs Lab 03/03/16 2000  AST 19  ALT 20  ALKPHOS 108  BILITOT 0.3  PROT 7.1  ALBUMIN 3.7   No results for input(s): LIPASE, AMYLASE in the last 168 hours. No results for input(s): AMMONIA in the last 168 hours. Coagulation Profile: No results for input(s): INR, PROTIME in the last 168 hours. Cardiac Enzymes: No results for input(s): CKTOTAL, CKMB, CKMBINDEX, TROPONINI in the last 168 hours. BNP (last 3 results) No results for input(s): PROBNP in the last 8760 hours. HbA1C: No results for input(s): HGBA1C in the last 72 hours. CBG:  Recent Labs Lab 03/04/16 0603 03/04/16 0753 03/04/16 0835 03/04/16 0924 03/04/16 1148  GLUCAP 165* 98 110* 114* 234*   Lipid Profile: No results for input(s): CHOL, HDL, LDLCALC, TRIG, CHOLHDL, LDLDIRECT in the last 72 hours. Thyroid Function Tests:  Recent Labs  03/03/16 2000  TSH 1.612   Anemia Panel: No results for input(s): VITAMINB12, FOLATE, FERRITIN, TIBC, IRON, RETICCTPCT in the last 72 hours. Urine analysis:    Component Value Date/Time   COLORURINE YELLOW 06/03/2013 1750   APPEARANCEUR CLEAR 06/03/2013 1750   LABSPEC 1.010 06/03/2013 1750   PHURINE 6.0 06/03/2013 1750   GLUCOSEU NEGATIVE 06/03/2013 1750   HGBUR NEGATIVE 06/03/2013 1750   BILIRUBINUR NEGATIVE 06/03/2013 1750   KETONESUR NEGATIVE 06/03/2013 1750   PROTEINUR NEGATIVE 06/03/2013 1750   UROBILINOGEN 0.2 06/03/2013 1750   NITRITE NEGATIVE 06/03/2013 1750   LEUKOCYTESUR NEGATIVE 06/03/2013 1750   Sepsis Labs: Invalid input(s): PROCALCITONIN, LACTICIDVEN  No results found for this or any previous visit (from the past 240 hour(s)).    Radiology Studies: Dg Chest 2 View  03/02/2016   CLINICAL DATA:  Cough, congestion EXAM: CHEST  2 VIEW COMPARISON:  None. FINDINGS: No active infiltrate or effusion is seen. Mediastinal and hilar contours are unremarkable. The heart is within normal limits in size. No bony abnormality is seen. IMPRESSION: No active cardiopulmonary disease. Electronically Signed   By: Ivar Drape M.D.   On: 03/02/2016 13:10     Scheduled Meds: . budesonide (PULMICORT) nebulizer solution  0.25 mg Nebulization BID  . carisoprodol  350 mg Oral TID  . diltiazem  360 mg Oral Daily  . fluticasone  2 spray Each Nare Daily  . gabapentin  100 mg Oral QPM  . insulin aspart  0-5 Units Subcutaneous QHS  . insulin aspart  0-9 Units Subcutaneous TID WC  . insulin glargine  15 Units Subcutaneous Daily  . insulin regular  0-10 Units Intravenous TID WC  . insulin  starter kit- pen needles  1 kit Other Once  . levofloxacin  500 mg Oral Daily  . living well with diabetes book   Does not apply Once  . loratadine  10 mg Oral QPM  . montelukast  10 mg Oral QHS  . nebivolol  5 mg Oral Daily  . pantoprazole  40 mg Oral Daily  . pneumococcal 23 valent vaccine  0.5 mL Intramuscular Tomorrow-1000  . topiramate  50 mg Oral QHS   Continuous Infusions: . sodium chloride Stopped (03/03/16 2255)     Time spent: 25 minutes, more than 50% bedside discussing about diabetes condition, diet, insulin and outpatient management   Costin Gherghe, MD, PhD [facility] Pager [phone]  If 7PM-7AM, please contact night-coverage [email or web address] Password [email or web address] 03/04/2016, 12:33 PM      

## 2016-03-04 NOTE — Care Management (Signed)
Case manager requested benefit check for copay for  Insulin : both levemir and lantus are tier 2, covered, no auth required - $45 for 30 day retail .

## 2016-03-04 NOTE — Plan of Care (Signed)
Problem: Food- and Nutrition-Related Knowledge Deficit (NB-1.1) Goal: Nutrition education Formal process to instruct or train a patient/client in a skill or to impart knowledge to help patients/clients voluntarily manage or modify food choices and eating behavior to maintain or improve health. Outcome: Completed/Met Date Met:  03/04/16  RD consulted for nutrition education regarding diabetes.   RD provided "Carbohydrate Counting for People with Diabetes" handout from the Academy of [employer] Dietetics. Discussed different food groups and their effects on blood sugar, emphasizing carbohydrate-containing foods. Provided list of carbohydrates and recommended serving sizes of common foods.  Discussed importance of controlled and consistent carbohydrate intake throughout the day. Provided examples of ways to balance meals/snacks and encouraged intake of high-fiber, whole grain complex carbohydrates. Pt reports mostly consuming water, however still discussed diabetic friendly drink options.Teach back method used.  Expect good compliance.  Corrin Parker, MS, RD, LDN Pager # [phone] After hours/ weekend pager # [phone]

## 2016-03-05 DIAGNOSIS — J189 Pneumonia, unspecified organism: Secondary | ICD-10-CM | POA: Diagnosis not present

## 2016-03-05 DIAGNOSIS — E118 Type 2 diabetes mellitus with unspecified complications: Secondary | ICD-10-CM

## 2016-03-05 DIAGNOSIS — E1122 Type 2 diabetes mellitus with diabetic chronic kidney disease: Secondary | ICD-10-CM | POA: Diagnosis not present

## 2016-03-05 LAB — HEMOGLOBIN A1C
HEMOGLOBIN A1C: 13.4 % — AB (ref 4.8–5.6)
Mean Plasma Glucose: 338 mg/dL

## 2016-03-05 LAB — GLUCOSE, CAPILLARY
GLUCOSE-CAPILLARY: 354 mg/dL — AB (ref 65–99)
GLUCOSE-CAPILLARY: 221 mg/dL — AB (ref 65–99)
Glucose-Capillary: 326 mg/dL — ABNORMAL HIGH (ref 65–99)
Glucose-Capillary: 209 mg/dL — ABNORMAL HIGH (ref 65–99)

## 2016-03-05 MED ORDER — INSULIN GLARGINE 100 UNIT/ML ~~LOC~~ SOLN: 20.0000 [IU] | Freq: Every day | SUBCUTANEOUS | Status: DC

## 2016-03-05 MED ORDER — INSULIN GLARGINE 100 UNIT/ML ~~LOC~~ SOLN
24.0000 [IU] | Freq: Every day | SUBCUTANEOUS | Status: DC
  Administered 2016-03-05: 24 [IU] via SUBCUTANEOUS
  Filled 2016-03-05 (×2): qty 0.24

## 2016-03-05 MED ORDER — INSULIN ASPART 100 UNIT/ML ~~LOC~~ SOLN
5.0000 [IU] | Freq: Three times a day (TID) | SUBCUTANEOUS | Status: DC
  Administered 2016-03-05 – 2016-03-06 (×2): 5 [IU] via SUBCUTANEOUS

## 2016-03-05 NOTE — Progress Notes (Signed)
Inpatient Diabetes Program Recommendations  AACE/ADA: New Consensus Statement on Inpatient Glycemic Control (2015)  Target Ranges:  Prepandial:   less than 140 mg/dL      Peak postprandial:   less than 180 mg/dL (1-2 hours)      Critically ill patients:  140 - 180 mg/dL   Results for Maria [name], RAYEL SCHIRTZINGER (MRN [id number]) as of 03/05/2016 08:07  Ref. Range 03/04/2016 09:24 03/04/2016 11:48 03/04/2016 16:16 03/04/2016 21:14 03/05/2016 06:32  Glucose-Capillary Latest Ref Range: 65-99 mg/dL 114 (H) 234 (H) 225 (H) 350 (H) 354 (H)   Review of Glycemic Control  Diabetes history: New diagnosis Current orders for Inpatient glycemic control: Lantus 15 units, Novolog Moderate + HS scale  Inpatient Diabetes Program Recommendations: Insulin - Basal: Fasting glucose 354 mg/dl after Lantus 15 units was given. Please consider increasing basal insulin to Lantus 24-26 units Daily.   Thanks,  Tama Headings RN, MSN, [facility] Inpatient Diabetes Coordinator Team Pager [phone] (8a-5p)

## 2016-03-05 NOTE — Progress Notes (Signed)
PROGRESS NOTE  Maria [name] [id number] DOB: 05-Mar-1961 DOA: 03/03/2016 PCP: Rogers Blocker, MD Outpatient Specialists:    Brief Narrative: 55 y.o. female with medical history significant of asthma, hypertension, anxiety, fibromyalgia, presents as a direct admit to the hospital as she was found to have elevated blood sugars in office the day prior to admission  Assessment & Plan: Active Problems:   Renal insufficiency   CAP (community acquired pneumonia)   GERD (gastroesophageal reflux disease)   HTN (hypertension)   Fibromyalgia syndrome   Hyperglycemia   CKD (chronic kidney disease), stage III   Newly diagnosed diabetes mellitus - Patient has been having elevated sugars for quite sometime as evidenced by hemoglobin A1c of 13 as an outpatient, hemoglobin A1c here is 13.4 - difficult to control CBGs on Lantus alone, suspect she may need mealtime insulin with NovoLog. This was discussed extensively with the patient at bedside today. - Her fasting CBGs in the 300 range, we'll increase Lantus to 24 units, add mealtime schedule NovoLog 5 units with meals plus sliding scale insulin. Will able to go home when the CBGs are better.  URI - continue Levaquin, Nebulizers  Asthma, without exacerbation - She has no wheezing, she is not short of breath and moves air well. - Duonebs as needed, Pulmicort - No need for IV steroids, especially with her hyperglycemia  Chronic kidney disease stage III - Creatinine yesterday was at baseline, unknown value today, BMP is pending  Fibromyalgia/anxiety/GERD  - Resume her home medications for her other medical problems    DVT prophylaxis: SCD Code Status: Full Family Communication: no family bedside Disposition Plan: home when ready, likely one day Barriers for discharge: Monitor CBGs  Consultants:   None   Procedures:   None   Antimicrobials:  Levaquin   Subjective: - she is feeling better this morning, no chest pain, no  shortness of breath   Objective: Filed Vitals:   03/04/16 2031 03/04/16 2052 03/05/16 0503 03/05/16 0844  BP: 114/50  112/70   Pulse: 67  62   Temp: 97.7 F (36.5 C)  98 F (36.7 C)   TempSrc: Oral  Oral   Resp: 18  18   Height:      Weight:      SpO2: 98% 97% 100% 99%    Intake/Output Summary (Last 24 hours) at 03/05/16 1407 Last data filed at 03/05/16 0700  Gross per 24 hour  Intake    720 ml  Output      0 ml  Net    720 ml   Filed Weights   03/03/16 1900  Weight: 78.472 kg (173 lb)    Examination: BP 112/70 mmHg  Pulse 62  Temp(Src) 98 F (36.7 C) (Oral)  Resp 18  Ht 5\' 5"  (1.651 m)  Wt 78.472 kg (173 lb)  BMI 28.79 kg/m2  SpO2 99%  LMP 05/21/2013  GENERAL: NAD  HEENT: head NCAT, no scleral icterus. Pupils round and reactive. Mucous membranes are moist. Posterior pharynx clear of any exudate or lesions.  NECK: Supple. No carotid bruits. No lymphadenopathy or thyromegaly.  LUNGS: No wheezing or crackles, moves air well  HEART: Regular rate and rhythm without murmur. 2+ pulses, no JVD, no peripheral edema  ABDOMEN: Soft, nontender, and nondistended. Positive bowel sounds.    Data Reviewed: I have personally reviewed following labs and imaging studies  CBC:  Recent Labs Lab 03/03/16 2000 03/04/16 0431  WBC 8.1 8.3  NEUTROABS 3.3  --   HGB  13.3 13.2  HCT 39.4 38.1  MCV 90.6 91.1  PLT 298 [id number]   Basic Metabolic Panel:  Recent Labs Lab 03/03/16 2000 03/04/16 0055 03/04/16 0431 03/04/16 0933  NA 136 138 137 139  K 4.6 3.9 3.9 4.0  CL 100* 107 103 109  CO2 22 20* 21* 20*  GLUCOSE 315* 130* 203* 127*  BUN 23* 21* 20 16  CREATININE 1.23* 1.09* 1.17* 1.19*  CALCIUM 9.5 9.3 9.1 9.5   GFR: Estimated Creatinine Clearance: 56.6 mL/min (by C-G formula based on Cr of 1.19). Liver Function Tests:  Recent Labs Lab 03/03/16 2000  AST 19  ALT 20  ALKPHOS 108  BILITOT 0.3  PROT 7.1  ALBUMIN 3.7   No results for input(s): LIPASE, AMYLASE  in the last 168 hours. No results for input(s): AMMONIA in the last 168 hours. Coagulation Profile: No results for input(s): INR, PROTIME in the last 168 hours. Cardiac Enzymes: No results for input(s): CKTOTAL, CKMB, CKMBINDEX, TROPONINI in the last 168 hours. BNP (last 3 results) No results for input(s): PROBNP in the last 8760 hours. HbA1C:  Recent Labs  03/04/16 0431  HGBA1C 13.4*   CBG:  Recent Labs Lab 03/04/16 1148 03/04/16 1616 03/04/16 2114 03/05/16 0632 03/05/16 1235  GLUCAP 234* 225* 350* 354* 326*   Lipid Profile: No results for input(s): CHOL, HDL, LDLCALC, TRIG, CHOLHDL, LDLDIRECT in the last 72 hours. Thyroid Function Tests:  Recent Labs  03/03/16 2000  TSH 1.612   Anemia Panel: No results for input(s): VITAMINB12, FOLATE, FERRITIN, TIBC, IRON, RETICCTPCT in the last 72 hours. Urine analysis:    Component Value Date/Time   COLORURINE YELLOW 06/03/2013 1750   APPEARANCEUR CLEAR 06/03/2013 1750   LABSPEC 1.010 06/03/2013 1750   PHURINE 6.0 06/03/2013 1750   GLUCOSEU NEGATIVE 06/03/2013 1750   HGBUR NEGATIVE 06/03/2013 1750   BILIRUBINUR NEGATIVE 06/03/2013 1750   KETONESUR NEGATIVE 06/03/2013 1750   PROTEINUR NEGATIVE 06/03/2013 1750   UROBILINOGEN 0.2 06/03/2013 1750   NITRITE NEGATIVE 06/03/2013 1750   LEUKOCYTESUR NEGATIVE 06/03/2013 1750   Sepsis Labs: Invalid input(s): PROCALCITONIN, LACTICIDVEN  No results found for this or any previous visit (from the past 240 hour(s)).    Radiology Studies: No results found.   Scheduled Meds: . budesonide (PULMICORT) nebulizer solution  0.25 mg Nebulization BID  . carisoprodol  350 mg Oral TID  . diltiazem  360 mg Oral Daily  . feeding supplement (GLUCERNA SHAKE)  237 mL Oral BID BM  . fluticasone  2 spray Each Nare Daily  . gabapentin  100 mg Oral QPM  . insulin aspart  0-5 Units Subcutaneous QHS  . insulin aspart  0-9 Units Subcutaneous TID WC  . insulin aspart  5 Units Subcutaneous TID WC   . insulin glargine  24 Units Subcutaneous Daily  . levofloxacin  500 mg Oral Daily  . loratadine  10 mg Oral QPM  . montelukast  10 mg Oral QHS  . nebivolol  5 mg Oral Daily  . pantoprazole  40 mg Oral Daily  . topiramate  50 mg Oral QHS   Continuous Infusions: . sodium chloride Stopped (03/03/16 2255)     Time spent: 25 minutes, more than 50% bedside discussing about diabetes condition, diet, insulin and outpatient management   Marzetta Board, MD, PhD [facility] Pager [phone]  If 7PM-7AM, please contact night-coverage [email or web address] Password [facility] 03/05/2016, 2:07 PM

## 2016-03-06 DIAGNOSIS — Z794 Long term (current) use of insulin: Secondary | ICD-10-CM | POA: Diagnosis not present

## 2016-03-06 DIAGNOSIS — E118 Type 2 diabetes mellitus with unspecified complications: Secondary | ICD-10-CM | POA: Diagnosis not present

## 2016-03-06 DIAGNOSIS — N183 Chronic kidney disease, stage 3 (moderate): Secondary | ICD-10-CM | POA: Diagnosis not present

## 2016-03-06 DIAGNOSIS — E1122 Type 2 diabetes mellitus with diabetic chronic kidney disease: Secondary | ICD-10-CM | POA: Diagnosis not present

## 2016-03-06 LAB — GLUCOSE, CAPILLARY
GLUCOSE-CAPILLARY: 268 mg/dL — AB (ref 65–99)
Glucose-Capillary: 283 mg/dL — ABNORMAL HIGH (ref 65–99)
Glucose-Capillary: 322 mg/dL — ABNORMAL HIGH (ref 65–99)
Glucose-Capillary: 207 mg/dL — ABNORMAL HIGH (ref 65–99)

## 2016-03-06 MED ORDER — INSULIN ASPART 100 UNIT/ML ~~LOC~~ SOLN
10.0000 [IU] | Freq: Three times a day (TID) | SUBCUTANEOUS | Status: DC
  Administered 2016-03-06 (×2): 10 [IU] via SUBCUTANEOUS

## 2016-03-06 MED ORDER — GLUCOSE BLOOD VI STRP: ORAL_STRIP | Status: AC

## 2016-03-06 MED ORDER — INSULIN ASPART 100 UNIT/ML FLEXPEN: 8.0000 [IU] | PEN_INJECTOR | Freq: Three times a day (TID) | SUBCUTANEOUS | Status: DC

## 2016-03-06 MED ORDER — PEN NEEDLES 31G X 5 MM MISC: 1.0000 | Freq: Four times a day (QID) | Status: DC

## 2016-03-06 MED ORDER — INSULIN GLARGINE 100 UNIT/ML SOLOSTAR PEN: 30.0000 [IU] | PEN_INJECTOR | Freq: Every morning | SUBCUTANEOUS | Status: DC

## 2016-03-06 MED ORDER — TRUE METRIX METER DEVI: 1.0000 | Freq: Four times a day (QID) | Status: AC

## 2016-03-06 MED ORDER — INSULIN GLARGINE 100 UNIT/ML ~~LOC~~ SOLN
30.0000 [IU] | Freq: Every day | SUBCUTANEOUS | Status: DC
  Administered 2016-03-06: 30 [IU] via SUBCUTANEOUS
  Filled 2016-03-06 (×3): qty 0.3

## 2016-03-06 MED ORDER — TRUEPLUS LANCETS 28G MISC: 1.0000 | Freq: Four times a day (QID) | Status: DC

## 2016-03-06 NOTE — Progress Notes (Signed)
Reviewed instructions with patient with teach back.  Answered patient's questions.  Reassured patient that she had demonstrated ability to get CBG, draw up insulin and administer injection.  She agreed and said it will just take some getting use to.  She requested note from Dr. as to when to return to work.  Letter from Dr. Debe Coder and included with DC instructions. Patient stated she will read over instructions again when she gets home. Denies any further questions and voices understanding.

## 2016-03-06 NOTE — Progress Notes (Signed)
Inpatient Diabetes Program Recommendations  AACE/ADA: New Consensus Statement on Inpatient Glycemic Control (2015)  Target Ranges:  Prepandial:   less than 140 mg/dL      Peak postprandial:   less than 180 mg/dL (1-2 hours)      Critically ill patients:  140 - 180 mg/dL   Review of Glycemic Control  Inpatient Diabetes Program Recommendations: Insulin - Basal: Fasting glucose in the 280's. Please consider increasing basal insulin again to Lantus 30 units Daily, May need to give additional units this am.  Thanks,  Tama Headings RN, MSN, [place] Diabetes Coordinator Team Pager [phone] (8a-5p)

## 2016-03-06 NOTE — Progress Notes (Signed)
Patient able to drawn and self injected herself with insulin.Diabetis Educations printed out on exit care and requested the incoming nurse to give and educate patient.

## 2016-03-06 NOTE — Progress Notes (Signed)
Patient asked for Hydrocodone/Chlorpheneramine or Tussinex ,given to her around 1100 in the morning.Nurse re-check [facility] for review,and found it was not scanned.Patient was discharged at 1700.Called Pharmacy Dept. And spoke to [place].He advised this Probation officer to make a note on chart.Writer was floated on this unit and returned to his home unit at 1600.

## 2016-03-07 NOTE — Discharge Summary (Signed)
Physician Discharge Summary  Maria [name] [id number] DOB: 1961/09/20 DOA: 03/03/2016  PCP: Rogers Blocker, MD  Admit date: 03/03/2016 Discharge date: 03/07/2016  Time spent: > 40 minutes  Recommendations for Outpatient Follow-up:  1. Follow up with Dr. Marlou Sa in 2 weeks 2. Maintain CBG log   Discharge Diagnoses:  Active Problems:   Renal insufficiency   CAP (community acquired pneumonia)   GERD (gastroesophageal reflux disease)   HTN (hypertension)   Fibromyalgia syndrome   Hyperglycemia   CKD (chronic kidney disease), stage III  Discharge Condition: stable  Diet recommendation: diabetic  Filed Weights   03/03/16 1900  Weight: 78.472 kg (173 lb)    History of present illness:  See H&P, Labs, Consult and Test reports for all details in brief, patient is a 55 y.o. female with medical history significant of asthma, hypertension, anxiety, fibromyalgia, presents as a direct admit to the hospital as she was found to have elevated blood sugars in office the day prior to admission  Hospital Course:  Newly diagnosed diabetes mellitus - Patient has been having elevated sugars for quite sometime as evidenced by hemoglobin A1c of 13.4. She was initially on a glucose stabilizer then transitioned to Lantus and mealtime insulin. She required several adjustments of her regimen and eventually her CBGs had some improvements. Suspect that she will need further adjustment as an outpatient, she was discharged on 30 U Lantus daily, Novolog 8 U TID with meals plus sliding scale insulin. Patient with good insight and good understanding in insulin administration, CBG evaluation and sliding scale use. She was advised to have a CBG log, check her CBGs 4 times daily and follow up with Dr. Marlou Sa in 1-2 weeks for follow up.  URI - continue Levaquin, Nebulizers> has 2 days of Levaquin left Asthma, without exacerbation  She has no wheezing, she is not short of breath and moves air well. No need for steroids,  especially with her hyperglycemia Chronic kidney disease stage III - Creatinine at baseline Fibromyalgia/anxiety/GERD  - Resume her home medications for her other medical problems   Procedures:  None    Consultations:  None   Discharge Exam: Filed Vitals:   03/05/16 2032 03/06/16 0446 03/06/16 0749 03/06/16 1411  BP:  112/65  111/53  Pulse: 66 57  62  Temp:  98.2 F (36.8 C)  97.7 F (36.5 C)  TempSrc:  Oral  Oral  Resp: 18 18  18   Height:      Weight:      SpO2:  98% 97% 97%   General: NAD Cardiovascular: RRR Respiratory: CTA biL  Discharge Instructions Activity:  As tolerated   Get Medicines reviewed and adjusted: Please take all your medications with you for your next visit with your Primary MD  Please request your Primary MD to go over all hospital tests and procedure/radiological results at the follow up, please ask your Primary MD to get all Hospital records sent to his/her office.  If you experience worsening of your admission symptoms, develop shortness of breath, life threatening emergency, suicidal or homicidal thoughts you must seek medical attention immediately by calling 911 or calling your MD immediately if symptoms less severe.  You must read complete instructions/literature along with all the possible adverse reactions/side effects for all the Medicines you take and that have been prescribed to you. Take any new Medicines after you have completely understood and accpet all the possible adverse reactions/side effects.   Do not drive when taking Pain medications.  Do not take more than prescribed Pain, Sleep and Anxiety Medications  Special Instructions: If you have smoked or chewed Tobacco in the last 2 yrs please stop smoking, stop any regular Alcohol and or any Recreational drug use.  Wear Seat belts while driving.  Please note  You were cared for by a hospitalist during your hospital stay. Once you are discharged, your primary care physician  will handle any further medical issues. Please note that NO REFILLS for any discharge medications will be authorized once you are discharged, as it is imperative that you return to your primary care physician (or establish a relationship with a primary care physician if you do not have one) for your aftercare needs so that they can reassess your need for medications and monitor your lab values.    Medication List    TAKE these medications        ALPRAZolam 1 MG tablet  Commonly known as:  XANAX  Take 1 mg by mouth at bedtime as needed for sleep or anxiety.     ARNUITY ELLIPTA 100 MCG/ACT Aepb  Generic drug:  Fluticasone Furoate  Inhale 1 puff into the lungs daily.     carisoprodol 250 MG tablet  Commonly known as:  SOMA  Take 350 mg by mouth 3 (three) times daily.     chlorpheniramine-HYDROcodone 10-8 MG/5ML Suer  Commonly known as:  TUSSIONEX PENNKINETIC ER  Take 5 mLs by mouth every 12 (twelve) hours as needed.     cholecalciferol 1000 units tablet  Commonly known as:  VITAMIN D  Take 1,000 Units by mouth daily.     DEXILANT 60 MG capsule  Generic drug:  dexlansoprazole  Take 60 mg by mouth daily.     diltiazem 360 MG 24 hr capsule  Commonly known as:  CARDIZEM CD  Take 360 mg by mouth daily.     estazolam 2 MG tablet  Commonly known as:  PROSOM  Take 2 mg by mouth at bedtime.     fish oil-omega-3 fatty acids 1000 MG capsule  Take 2 g by mouth daily.     fluticasone 50 MCG/ACT nasal spray  Commonly known as:  FLONASE  Place 2 sprays into the nose daily.     gabapentin 100 MG capsule  Commonly known as:  NEURONTIN  Take 100 mg by mouth every evening. Reported on 03/03/2016     glucose blood test strip  Commonly known as:  TRUE METRIX BLOOD GLUCOSE TEST  4 times daily. Generic OK if available. Other brands OK based on price.     HYDROcodone-acetaminophen 7.5-325 MG tablet  Commonly known as:  NORCO  Take 1 tablet by mouth every 6 (six) hours as needed for pain.       insulin aspart 100 UNIT/ML FlexPen  Commonly known as:  NOVOLOG  Inject 8-13 Units into the skin 3 (three) times daily with meals. 8 units with each meal PLUS If CBG 70 - 120: 0 additional units CBG 121 - 150: 0 additional units CBG 151 - 200: 0 additional units CBG 201 - 250:  2 additional units CBG 251 - 300: 3 additional units CBG 301 - 350: 4 additional units CBG 351 - 400: 5 additional units > 400 call MD     Insulin Glargine 100 UNIT/ML Solostar Pen  Commonly known as:  LANTUS  Inject 30 Units into the skin every morning.     levocetirizine 5 MG tablet  Commonly known as:  XYZAL  Take 5  mg by mouth every evening.     levofloxacin 500 MG tablet  Commonly known as:  LEVAQUIN  Take 1 tablet (500 mg total) by mouth daily.     lidocaine 5 % ointment  Commonly known as:  XYLOCAINE  Apply 1 application topically daily as needed for moderate pain. Reported on 03/02/2016     montelukast 10 MG tablet  Commonly known as:  SINGULAIR  Take 10 mg by mouth at bedtime.     multivitamin with minerals Tabs tablet  Take 1 tablet by mouth daily.     nebivolol 10 MG tablet  Commonly known as:  BYSTOLIC  Take 5 mg by mouth daily.     nebivolol 5 MG tablet  Commonly known as:  BYSTOLIC  Take 5 mg by mouth daily.     Pen Needles 31G X 5 MM Misc  1 each by Does not apply route 4 (four) times daily. Generic OK.     topiramate 50 MG tablet  Commonly known as:  TOPAMAX  Take 50 mg by mouth at bedtime. Reported on 03/02/2016     traMADol 50 MG tablet  Commonly known as:  ULTRAM  Take 50 mg by mouth every 6 (six) hours as needed for moderate pain.     triamterene-hydrochlorothiazide 37.5-25 MG capsule  Commonly known as:  DYAZIDE  Take 1 capsule by mouth daily.     TRUE METRIX METER Devi  1 each by Does not apply route 4 (four) times daily. Generic OK if available. Other brands OK based on price.     TRUEPLUS LANCETS 28G Misc  1 each by Does not apply route 4 (four) times daily.  Generic OK if available. Other brands OK based on price.         The results of significant diagnostics from this hospitalization (including imaging, microbiology, ancillary and laboratory) are listed below for reference.    Significant Diagnostic Studies: Dg Chest 2 View  03/02/2016  CLINICAL DATA:  Cough, congestion EXAM: CHEST  2 VIEW COMPARISON:  None. FINDINGS: No active infiltrate or effusion is seen. Mediastinal and hilar contours are unremarkable. The heart is within normal limits in size. No bony abnormality is seen. IMPRESSION: No active cardiopulmonary disease. Electronically Signed   By: Ivar Drape M.D.   On: 03/02/2016 13:10   Labs: Basic Metabolic Panel:  Recent Labs Lab 03/03/16 2000 03/04/16 0055 03/04/16 0431 03/04/16 0933  NA 136 138 137 139  K 4.6 3.9 3.9 4.0  CL 100* 107 103 109  CO2 22 20* 21* 20*  GLUCOSE 315* 130* 203* 127*  BUN 23* 21* 20 16  CREATININE 1.23* 1.09* 1.17* 1.19*  CALCIUM 9.5 9.3 9.1 9.5   Liver Function Tests:  Recent Labs Lab 03/03/16 2000  AST 19  ALT 20  ALKPHOS 108  BILITOT 0.3  PROT 7.1  ALBUMIN 3.7   CBC:  Recent Labs Lab 03/03/16 2000 03/04/16 0431  WBC 8.1 8.3  NEUTROABS 3.3  --   HGB 13.3 13.2  HCT 39.4 38.1  MCV 90.6 91.1  PLT 298 295   CBG:  Recent Labs Lab 03/05/16 2105 03/06/16 0626 03/06/16 1020 03/06/16 1529 03/06/16 1711  GLUCAP 209* 283* 322* 268* 207*      Signed:  Alexes Lamarque  [facility] 03/07/2016, 11:43 AM

## 2016-07-12 ENCOUNTER — Encounter: Payer: BLUE CROSS/BLUE SHIELD | Attending: Internal Medicine

## 2016-07-12 VITALS — Ht 64.0 in | Wt 175.8 lb

## 2016-07-12 DIAGNOSIS — E7800 Pure hypercholesterolemia, unspecified: Secondary | ICD-10-CM | POA: Insufficient documentation

## 2016-07-12 DIAGNOSIS — Z713 Dietary counseling and surveillance: Secondary | ICD-10-CM | POA: Insufficient documentation

## 2016-07-12 DIAGNOSIS — E669 Obesity, unspecified: Secondary | ICD-10-CM | POA: Insufficient documentation

## 2016-07-12 DIAGNOSIS — E119 Type 2 diabetes mellitus without complications: Secondary | ICD-10-CM

## 2016-07-14 NOTE — Progress Notes (Signed)
Patient was seen on 07/12/2016 for the first of a series of three diabetes self-management courses at the [facility].  Patient Education Plan per assessed needs and concerns is to attend four course education program for Diabetes Self Management Education.  The following learning objectives were met by the patient during this class:  Describe diabetes  State some common risk factors for diabetes  Defines the role of glucose and insulin  Identifies type of diabetes and pathophysiology  Describe the relationship between diabetes and cardiovascular risk  State the members of the Healthcare Team  States the rationale for glucose monitoring  State when to test glucose  State their individual Target Range  State the importance of logging glucose readings  Describe how to interpret glucose readings  Identifies A1C target  Explain the correlation between A1c and eAG values  State symptoms and treatment of high blood glucose  State symptoms and treatment of low blood glucose  Explain proper technique for glucose testing  Identifies proper sharps disposal  Handouts given during class include:  Living Well with Diabetes book  Carb Counting and Meal Planning book  Meal Plan Card  Carbohydrate guide  Meal planning worksheet  Low Sodium Flavoring Tips  The diabetes portion plate  B5A to eAG Conversion Chart  Diabetes Medications  Diabetes Recommended Care Schedule  Support Group  Diabetes Success Plan  Core Class Satisfaction Survey  Follow-Up Plan:  Attend core 2

## 2016-07-19 DIAGNOSIS — E119 Type 2 diabetes mellitus without complications: Secondary | ICD-10-CM

## 2016-07-19 DIAGNOSIS — E669 Obesity, unspecified: Secondary | ICD-10-CM | POA: Diagnosis not present

## 2016-07-20 NOTE — Progress Notes (Signed)

## 2016-07-26 ENCOUNTER — Encounter: Payer: BLUE CROSS/BLUE SHIELD | Attending: Internal Medicine

## 2016-07-26 DIAGNOSIS — E7800 Pure hypercholesterolemia, unspecified: Secondary | ICD-10-CM | POA: Diagnosis not present

## 2016-07-26 DIAGNOSIS — Z713 Dietary counseling and surveillance: Secondary | ICD-10-CM | POA: Insufficient documentation

## 2016-07-26 DIAGNOSIS — E669 Obesity, unspecified: Secondary | ICD-10-CM | POA: Insufficient documentation

## 2016-07-27 NOTE — Progress Notes (Signed)
Patient was seen on 07/26/2016 for the third of a series of three diabetes self-management courses at the [facility].   Maria [name] the amount of activity recommended for healthy living . Describe activities suitable for individual needs . Identify ways to regularly incorporate activity into daily life . Identify barriers to activity and ways to over come these barriers  Identify diabetes medications being personally used and their primary action for lowering glucose and possible side effects . Describe role of stress on blood glucose and develop strategies to address psychosocial issues . Identify diabetes complications and ways to prevent them  Explain how to manage diabetes during illness . Evaluate success in meeting personal goal . Establish 2-3 goals that they will plan to diligently work on until they return for the  29-month follow-up visit  Goals:   I will be active 30 minutes or more 5 times a week  To help manage stress I will  Seek counseling as needed  Your patient has identified these potential barriers to change:  Motivation Stress Lack of Family Support  Your patient has identified their diabetes self-care support plan as  ? Plan:  Attend Support Group as desired

## 2016-08-10 ENCOUNTER — Institutional Professional Consult (permissible substitution): Payer: BLUE CROSS/BLUE SHIELD | Admitting: Internal Medicine

## 2016-08-17 ENCOUNTER — Other Ambulatory Visit: Payer: Self-pay | Admitting: Obstetrics and Gynecology

## 2016-08-17 DIAGNOSIS — Z1231 Encounter for screening mammogram for malignant neoplasm of breast: Secondary | ICD-10-CM

## 2016-09-02 ENCOUNTER — Ambulatory Visit
Admission: RE | Admit: 2016-09-02 | Discharge: 2016-09-02 | Disposition: A | Discharge status: Home / Self Care | Payer: BLUE CROSS/BLUE SHIELD | Source: Ambulatory Visit | Attending: Obstetrics and Gynecology | Admitting: Obstetrics and Gynecology

## 2016-09-02 DIAGNOSIS — Z1231 Encounter for screening mammogram for malignant neoplasm of breast: Secondary | ICD-10-CM

## 2016-09-19 ENCOUNTER — Other Ambulatory Visit: Payer: Self-pay | Admitting: Internal Medicine

## 2016-09-19 ENCOUNTER — Ambulatory Visit
Admission: RE | Admit: 2016-09-19 | Discharge: 2016-09-19 | Disposition: A | Discharge status: Home / Self Care | Payer: BLUE CROSS/BLUE SHIELD | Source: Ambulatory Visit | Attending: Internal Medicine | Admitting: Internal Medicine

## 2016-09-19 DIAGNOSIS — R059 Cough, unspecified: Secondary | ICD-10-CM

## 2016-09-19 DIAGNOSIS — R05 Cough: Secondary | ICD-10-CM

## 2016-09-28 ENCOUNTER — Ambulatory Visit (INDEPENDENT_AMBULATORY_CARE_PROVIDER_SITE_OTHER): Payer: BLUE CROSS/BLUE SHIELD | Admitting: Physician Assistant

## 2016-09-28 VITALS — BP 128/80 | HR 58 | Temp 98.7°F | Resp 15 | Ht 64.0 in | Wt 173.6 lb

## 2016-09-28 DIAGNOSIS — R05 Cough: Secondary | ICD-10-CM | POA: Diagnosis not present

## 2016-09-28 DIAGNOSIS — J3489 Other specified disorders of nose and nasal sinuses: Secondary | ICD-10-CM | POA: Diagnosis not present

## 2016-09-28 DIAGNOSIS — R059 Cough, unspecified: Secondary | ICD-10-CM

## 2016-09-28 LAB — POCT CBC
Granulocyte percent: 49.9 %G (ref 37–80)
HEMATOCRIT: 36.8 % — AB (ref 37.7–47.9)
Hemoglobin: 13 g/dL (ref 12.2–16.2)
LYMPH, POC: 3.4 (ref 0.6–3.4)
MCH, POC: 31.6 pg — AB (ref 27–31.2)
MCHC: 35.3 g/dL (ref 31.8–35.4)
MCV: 89.4 fL (ref 80–97)
MID (CBC): 0.3 (ref 0–0.9)
MPV: 8.1 fL (ref 0–99.8)
POC GRANULOCYTE: 3.6 (ref 2–6.9)
POC LYMPH %: 46.6 % (ref 10–50)
POC MID %: 3.5 % (ref 0–12)
Platelet Count, POC: 249 10*3/uL (ref 142–424)
RBC: 4.12 M/uL (ref 4.04–5.48)
RDW, POC: 16 %
WBC: 7.3 10*3/uL (ref 4.6–10.2)

## 2016-09-28 LAB — GLUCOSE, POCT (MANUAL RESULT ENTRY): POC Glucose: 105 mg/dl — AB (ref 70–99)

## 2016-09-28 MED ORDER — BENZONATATE 100 MG PO CAPS: 100.0000 mg | ORAL_CAPSULE | Freq: Three times a day (TID) | ORAL | 0 refills | Status: DC | PRN

## 2016-09-28 MED ORDER — PREDNISONE 20 MG PO TABS: ORAL_TABLET | ORAL | 0 refills | Status: DC

## 2016-09-28 NOTE — Patient Instructions (Addendum)
You can use OTC mucinex for relief.  For tea: ginger root, honey, lemon, and tiny bit of garlic  - I recommend you rest, drink plenty of fluids, eat light meals including soups.  - You may use cough syrup at night for your cough and sore throat, Tessalon pearls during the day.  -You can use OTC mucinex for relief.  -For tea: ginger root, honey, lemon, and tiny bit of garlic - You may also use Tylenol or ibuprofen over-the-counter for your sore throat.  - Please let me know if you are not seeing any improvement or get worse in 3 days and I will call in another prescription   -Please check sugars while on prednisone.        IF you received an x-ray today, you will receive an invoice from [facility]. Please contact [facility] at [phone] with questions or concerns regarding your invoice.   IF you received labwork today, you will receive an invoice from [employer]. Please contact [facility] at [phone] with questions or concerns regarding your invoice.   Our billing staff will not be able to assist you with questions regarding bills from these companies.  You will be contacted with the lab results as soon as they are available. The fastest way to get your results is to activate your My Chart account. Instructions are located on the last page of this paperwork. If you have not heard from [place] regarding the results in 2 weeks, please contact this office.

## 2016-09-28 NOTE — Progress Notes (Signed)
MRN: [id number] DOB: 1961/04/07  Subjective:   Maria [name] is a 55 y.o. female presenting for chief complaint of Cough (body aches, headache, chest discomfort, chills, hoarseness since this morning.) and history of Pneumonia .  Reports one month history of chest tightness, dry cough with no hemoptysis, myalgia, nasal congestion, hoarseness, ear ache, sinus pressure, headache, subjective fever and chills. She originally saw her PCP on 09/15/16, he prescribed five day course of leviquin and that did not provide any relief. She then saw him again in office on 09/22/16. He did a xray, no pneumonia was present but he did see some bronchitis. He prescribed a cough syrup but pt states she could not afford it so she used robitssun dm. Has not tried anything else for relief. Denies night sweats, decreased appetite, nausea, vomiting, abdominal pain and diarrhea. Has had sick contact with people at work. Has istory of seasonal allergies and history of asthma. Has had pneumonia two times in the past (2014, 2017). Patient has had flu shot this season. Pt smokes 1-2 cigarettes a day. Denies any other aggravating or relieving factors, no other questions or concerns.  Maria [name] has a current medication list which includes the following prescription(s): alprazolam, arnuity ellipta, true metrix meter, carisoprodol, cholecalciferol, dexlansoprazole, diltiazem, estazolam, fish oil-omega-3 fatty acids, fluticasone, gabapentin, glucose blood, pen needles, levocetirizine, lidocaine, montelukast, multivitamin with minerals, nebivolol, sitagliptin-metformin, tramadol, triamterene-hydrochlorothiazide, trueplus lancets 28g, azelastine hcl, hydrocortisone, and valacyclovir. Also is allergic to advair diskus [fluticasone-salmeterol] and other.  Maria [name]  has a past medical history of Anxiety; Asthma; Chronic kidney disease (CKD), stage III (moderate); Chronic lower back pain; Daily headache; DKA (diabetic ketoacidoses) ([place])  (admission 03/03/2016); Fibroid; Fibromyalgia; GERD (gastroesophageal reflux disease); HTN (hypertension); Pelvic pain in female; Pneumonia (05/2013); PONV (postoperative nausea and vomiting); and Renal insufficiency. Also  has a past surgical history that includes Breast biopsy (Right) and Dilation and curettage of uterus (2008).  Objective:   Vitals: BP 128/80 (BP Location: Right Arm, Patient Position: Sitting, Cuff Size: Normal)   Pulse (!) 58   Temp 98.7 F (37.1 C) (Oral)   Resp 15   Ht 5\' 4"  (1.626 m)   Wt 173 lb 9.6 oz (78.7 kg)   LMP 05/21/2013   SpO2 99%   BMI 29.80 kg/m   Physical Exam  Constitutional: She is oriented to person, place, and time. She appears well-developed and well-nourished.  HENT:  Head: Normocephalic and atraumatic.  Mouth/Throat: Uvula is midline and mucous membranes are normal. Posterior oropharyngeal erythema present.  Bilateral tenderness to palpation of maxillary sinuses    Eyes: Conjunctivae are normal.  Neck: Normal range of motion.  Cardiovascular: Normal rate, regular rhythm and normal heart sounds.   Pulmonary/Chest: Effort normal and breath sounds normal. She has no wheezes. She has no rales.  Lymphadenopathy:       Head (right side): No submental, no submandibular, no tonsillar, no preauricular, no posterior auricular and no occipital adenopathy present.       Head (left side): No submental, no submandibular, no tonsillar, no preauricular, no posterior auricular and no occipital adenopathy present.    She has no cervical adenopathy.       Right: No supraclavicular adenopathy present.       Left: No supraclavicular adenopathy present.  Neurological: She is alert and oriented to person, place, and time.  Skin: Skin is warm and dry.  Psychiatric: She has a normal mood and affect.  Vitals reviewed.   Pulse Readings from Last 3  Encounters:  09/28/16 (!) 58  03/06/16 62  03/02/16 72    Results for orders placed or performed in visit on  09/28/16 (from the past 24 hour(s))  POCT CBC     Status: Abnormal   Collection Time: 09/28/16  2:47 PM  Result Value Ref Range   WBC 7.3 4.6 - 10.2 K/uL   Lymph, poc 3.4 0.6 - 3.4   POC LYMPH PERCENT 46.6 10 - 50 %L   MID (cbc) 0.3 0 - 0.9   POC MID % 3.5 0 - 12 %M   POC Granulocyte 3.6 2 - 6.9   Granulocyte percent 49.9 37 - 80 %G   RBC 4.12 4.04 - 5.48 M/uL   Hemoglobin 13.0 12.2 - 16.2 g/dL   HCT, POC 36.8 (A) 37.7 - 47.9 %   MCV 89.4 80 - 97 fL   MCH, POC 31.6 (A) 27 - 31.2 pg   MCHC 35.3 31.8 - 35.4 g/dL   RDW, POC 16.0 %   Platelet Count, POC 249 142 - 424 K/uL   MPV 8.1 0 - 99.8 fL  POCT glucose (manual entry)     Status: Abnormal   Collection Time: 09/28/16  2:48 PM  Result Value Ref Range   POC Glucose 105 (A) 70 - 99 mg/dl    Assessment and Plan :  1. Cough - POCT CBC - POCT glucose (manual entry) - predniSONE (DELTASONE) 20 MG tablet; 2-2-2-2-2-1-1-1-1-1. Take in the am with food.  Dispense: 15 tablet; Refill: 0 - benzonatate (TESSALON) 100 MG capsule; Take 1-2 capsules (100-200 mg total) by mouth 3 (three) times daily as needed for cough.  Dispense: 40 capsule; Refill: 0 -Pt to contact me in three days if no improvement with prednisone and I will put in a prescription for doxycycline 100mg  BID x 10 days.  2. Sinus pain - predniSONE (DELTASONE) 20 MG tablet; 2-2-2-2-2-1-1-1-1-1. Take in the am with food.  Dispense: 15 tablet; Refill: 0  -Although advair diskus is listed as pt allergy, pt has had prednisone multiple times in the past and does not have any side effects to this medication.     Tenna Delaine, PA-C  [facility] 09/28/2016 2:49 PM

## 2016-11-15 ENCOUNTER — Ambulatory Visit: Payer: BLUE CROSS/BLUE SHIELD

## 2016-12-08 ENCOUNTER — Ambulatory Visit (INDEPENDENT_AMBULATORY_CARE_PROVIDER_SITE_OTHER): Payer: BLUE CROSS/BLUE SHIELD | Admitting: Urgent Care

## 2016-12-08 ENCOUNTER — Ambulatory Visit (INDEPENDENT_AMBULATORY_CARE_PROVIDER_SITE_OTHER): Payer: BLUE CROSS/BLUE SHIELD

## 2016-12-08 VITALS — BP 144/90 | HR 58 | Temp 97.8°F | Resp 17 | Ht 64.0 in | Wt 182.0 lb

## 2016-12-08 DIAGNOSIS — S99911A Unspecified injury of right ankle, initial encounter: Secondary | ICD-10-CM

## 2016-12-08 DIAGNOSIS — M25571 Pain in right ankle and joints of right foot: Secondary | ICD-10-CM | POA: Diagnosis not present

## 2016-12-08 DIAGNOSIS — M25471 Effusion, right ankle: Secondary | ICD-10-CM

## 2016-12-08 DIAGNOSIS — S93401A Sprain of unspecified ligament of right ankle, initial encounter: Secondary | ICD-10-CM

## 2016-12-08 DIAGNOSIS — Z23 Encounter for immunization: Secondary | ICD-10-CM

## 2016-12-08 DIAGNOSIS — W19XXXA Unspecified fall, initial encounter: Secondary | ICD-10-CM | POA: Diagnosis not present

## 2016-12-08 DIAGNOSIS — S80212A Abrasion, left knee, initial encounter: Secondary | ICD-10-CM | POA: Diagnosis not present

## 2016-12-08 MED ORDER — KETOROLAC TROMETHAMINE 60 MG/2ML IM SOLN
60.0000 mg | Freq: Once | INTRAMUSCULAR | Status: AC
  Administered 2016-12-08: 60 mg via INTRAMUSCULAR

## 2016-12-08 MED ORDER — MUPIROCIN 2 % EX OINT: 1.0000 "application " | TOPICAL_OINTMENT | Freq: Three times a day (TID) | CUTANEOUS | 1 refills | Status: DC

## 2016-12-08 NOTE — Progress Notes (Addendum)
MRN: [id number] DOB: November 30, 1961  Subjective:   Maria [name] is a 55 y.o. female presenting for chief complaint of Ankle Injury (RT ankle with fall today in parking lot coming from work)  Reports having suffered a fall in the parking lot at her job. She does not know how the fall happened but feels like her right ankle twisted or rolled. She also made impact with her knees and hands. Denies head injury, loss of consciousness. Has had severe pain of her right ankle, moderate pain of her left knee and in general over her scrapes and scratches of her hands and right knee. She has not been able to bear weight on her right foot due to her ankle pain and swelling. Has not tried any pain medications for relief. Has CKD stage 3.  Layelle has a current medication list which includes the following prescription(s): alprazolam, arnuity ellipta, azelastine hcl, benzonatate, true metrix meter, carisoprodol, cholecalciferol, dexlansoprazole, diltiazem, estazolam, fish oil-omega-3 fatty acids, fluticasone, gabapentin, glucose blood, hydrocortisone, pen needles, levocetirizine, lidocaine, montelukast, multivitamin with minerals, nebivolol, sitagliptin-metformin, tramadol, triamterene-hydrochlorothiazide, trueplus lancets 28g, and valacyclovir. Also is allergic to advair diskus [fluticasone-salmeterol] and other.  Leverta  has a past medical history of Anxiety; Asthma; Chronic kidney disease (CKD), stage III (moderate); Chronic lower back pain; Daily headache; DKA (diabetic ketoacidoses) ([place]) (admission 03/03/2016); Fibroid; Fibromyalgia; GERD (gastroesophageal reflux disease); HTN (hypertension); Pelvic pain in female; Pneumonia (05/2013); PONV (postoperative nausea and vomiting); and Renal insufficiency. Also  has a past surgical history that includes Breast biopsy (Right) and Dilation and curettage of uterus (2008).   Objective:   Vitals: BP (!) 144/90 (BP Location: Right Arm, Patient Position: Sitting,  Cuff Size: Normal)   Pulse (!) 58   Temp 97.8 F (36.6 C) (Oral)   Resp 17   Ht 5\' 4"  (1.626 m)   Wt 182 lb (82.6 kg)   LMP 05/21/2013   SpO2 97%   BMI 31.24 kg/m   Physical Exam  Constitutional: She is oriented to person, place, and time. She appears well-developed and well-nourished.  Cardiovascular: Normal rate.   Pulmonary/Chest: Effort normal.  Musculoskeletal:       Right ankle: She exhibits decreased range of motion, swelling and ecchymosis. She exhibits no deformity, no laceration and normal pulse. Tenderness. Lateral malleolus and AITFL tenderness found. No medial malleolus, no CF ligament, no posterior TFL, no head of 5th metatarsal and no proximal fibula tenderness found. Achilles tendon exhibits no pain and no defect.       Legs: Neurological: She is alert and oriented to person, place, and time.  Skin: Skin is warm and dry.   Dg Ankle Complete Right  Result Date: 12/08/2016 CLINICAL DATA:  Fall today.  Right ankle injury.  Initial encounter. EXAM: RIGHT ANKLE - COMPLETE 3+ VIEW COMPARISON:  None. FINDINGS: There is prominent soft tissue swelling involving the lateral and anterior aspects of the ankle. There is no evidence of fracture, dislocation, focal osseous lesion, arthropathy, or radiopaque foreign body. IMPRESSION: Soft tissue swelling without evidence of acute osseous abnormality. Electronically Signed   By: Logan Bores M.D.   On: 12/08/2016 17:44   Assessment and Plan :   1. Fall, initial encounter 2. Injury of right ankle, initial encounter 3. Abrasion of left knee, initial encounter 4. Acute right ankle pain 5. Right ankle swelling 6. Sprain of right ankle, unspecified ligament, initial encounter - Start RICE method for management of ankle pain secondary to sprain. Use crutches and ankle brace.  APAP for pain associated with this and abrasions. Use mupirocin for 5-7 days over left knee.  7. Need for Tdap vaccination - Tdap vaccine greater than or equal to  7yo IM   Jaynee Eagles, PA-C [facility] [phone] 12/08/2016 4:48 PM

## 2016-12-08 NOTE — Addendum Note (Signed)
Addended by: Jaynee Eagles on: 12/08/2016 05:57 PM   Modules accepted: Orders

## 2016-12-08 NOTE — Patient Instructions (Addendum)
Schedule Tylenol 500mg  every 6 hours for your ankle pain. Wear the ankle brace for the next week especially if you are going to be up and about. Use your crutches through the weekend to make sure you are not bearing weight on your right ankle.     Ankle Sprain An ankle sprain is a stretch or tear in one of the tough, fiber-like tissues (ligaments) in the ankle. The ligaments in your ankle help to hold the bones of the ankle together. What are the causes? This condition is often caused by stepping on or falling on the outer edge of the foot. What increases the risk? This condition is more likely to develop in people who play sports. What are the signs or symptoms? Symptoms of this condition include:  Pain in your ankle.  Swelling.  Bruising. Bruising may develop right after you sprain your ankle or 1-2 days later.  Trouble standing or walking, especially when you turn or change directions. How is this diagnosed? This condition is diagnosed with a physical exam. During the exam, your health care provider will press on certain parts of your foot and ankle and try to move them in certain ways. X-rays may be taken to see how severe the sprain is and to check for broken bones. How is this treated? This condition may be treated with:  A brace. This is used to keep the ankle from moving until it heals.  An elastic bandage. This is used to support the ankle.  Crutches.  Pain medicine.  Surgery. This may be needed if the sprain is severe.  Physical therapy. This may help to improve the range of motion in the ankle. Follow these instructions at home:  Rest your ankle.  Take over-the-counter and prescription medicines only as told by your health care provider.  For 2-3 days, keep your ankle raised (elevated) above the level of your heart as much as possible.  If directed, apply ice to the area:  Put ice in a plastic bag.  Place a towel between your skin and the bag.  Leave the  ice on for 20 minutes, 2-3 times a day.  If you were given a brace:  Wear it as directed.  Remove it to shower or bathe.  Try not to move your ankle much, but wiggle your toes from time to time. This helps to prevent swelling.  If you were given an elastic bandage (dressing):  Remove it to shower or bathe.  Try not to move your ankle much, but wiggle your toes from time to time. This helps to prevent swelling.  Adjust the dressing to make it more comfortable if it feels too tight.  Loosen the dressing if you have numbness or tingling in your foot, or if your foot becomes cold and blue.  If you have crutches, use them as told by your health care provider. Continue to use them until you can walk without feeling pain in your ankle. Contact a health care provider if:  You have rapidly increasing bruising or swelling.  Your pain is not relieved with medicine. Get help right away if:  Your toes or foot becomes numb or blue.  You have severe pain that gets worse. This information is not intended to replace advice given to you by your health care provider. Make sure you discuss any questions you have with your health care provider. Document Released: 12/12/2005 Document Revised: 04/20/2016 Document Reviewed: 07/14/2015 Elsevier Interactive Patient Education  2017 [employer].  IF you received an x-ray today, you will receive an invoice from [facility]. Please contact [facility] at [phone] with questions or concerns regarding your invoice.   IF you received labwork today, you will receive an invoice from [place]. Please contact [facility] at [phone] with questions or concerns regarding your invoice.   Our billing staff will not be able to assist you with questions regarding bills from these companies.  You will be contacted with the lab results as soon as they are available. The fastest way to get your results is to activate your My Chart account.  Instructions are located on the last page of this paperwork. If you have not heard from [place] regarding the results in 2 weeks, please contact this office.

## 2016-12-22 ENCOUNTER — Ambulatory Visit (INDEPENDENT_AMBULATORY_CARE_PROVIDER_SITE_OTHER): Payer: BLUE CROSS/BLUE SHIELD | Admitting: Urgent Care

## 2016-12-22 ENCOUNTER — Ambulatory Visit (INDEPENDENT_AMBULATORY_CARE_PROVIDER_SITE_OTHER): Payer: BLUE CROSS/BLUE SHIELD

## 2016-12-22 ENCOUNTER — Encounter: Payer: Self-pay | Admitting: Urgent Care

## 2016-12-22 VITALS — HR 70 | Temp 98.7°F | Resp 17 | Ht 64.0 in | Wt 177.0 lb

## 2016-12-22 DIAGNOSIS — M25471 Effusion, right ankle: Secondary | ICD-10-CM

## 2016-12-22 DIAGNOSIS — M25571 Pain in right ankle and joints of right foot: Secondary | ICD-10-CM | POA: Diagnosis not present

## 2016-12-22 DIAGNOSIS — S93401D Sprain of unspecified ligament of right ankle, subsequent encounter: Secondary | ICD-10-CM

## 2016-12-22 MED ORDER — KETOROLAC TROMETHAMINE 30 MG/ML IJ SOLN
30.0000 mg | Freq: Once | INTRAMUSCULAR | Status: AC
  Administered 2016-12-22: 30 mg via INTRAMUSCULAR

## 2016-12-22 NOTE — Progress Notes (Signed)
    MRN: [id number] DOB: 07-Aug-1961  Subjective:   Maria [name] is a 55 y.o. female presenting for chief complaint of Follow-up (ankle)  Last office visit was 12/08/2016 for suffering fall, diagnosed with ankle sprain, abrasions. X-rays were negative. Patient was recommended to be non-weight bearing, use crutches, schedule APAP, use Sweedo. She has done so and has tried minor ankle rehab. Reports persistent pain, swelling. Denies reinjury, redness, numbness or tingling. Patient just filled a script for hydrocodone-apap, 30 tablets for chronic back pain. She has a history of fibromyalgia. Also has diabetes, CKD III. She has seen Dr. Alfonso Ramus with [facility] and Yvonne Kendall.  Lorina has a current medication list which includes the following prescription(s): alprazolam, arnuity ellipta, azelastine hcl, benzonatate, true metrix meter, carisoprodol, cholecalciferol, dexlansoprazole, diltiazem, estazolam, fish oil-omega-3 fatty acids, fluticasone, gabapentin, glucose blood, hydrocortisone, pen needles, levocetirizine, lidocaine, montelukast, multivitamin with minerals, mupirocin ointment, nebivolol, sitagliptin-metformin, tramadol, triamterene-hydrochlorothiazide, trueplus lancets 28g, and valacyclovir. Also is allergic to advair diskus [fluticasone-salmeterol] and other.  Georgian  has a past medical history of Anxiety; Asthma; Chronic kidney disease (CKD), stage III (moderate); Chronic lower back pain; Daily headache; DKA (diabetic ketoacidoses) ([place]) (admission 03/03/2016); Fibroid; Fibromyalgia; GERD (gastroesophageal reflux disease); HTN (hypertension); Pelvic pain in female; Pneumonia (05/2013); PONV (postoperative nausea and vomiting); and Renal insufficiency. Also  has a past surgical history that includes Breast biopsy (Right) and Dilation and curettage of uterus (2008).  Objective:   Vitals: Pulse 70   Temp 98.7 F (37.1 C)   Resp 17   Ht 5\' 4"  (1.626 m)   Wt 177 lb (80.3 kg)   LMP  05/21/2013   SpO2 96%   BMI 30.38 kg/m   Physical Exam  Constitutional: She is oriented to person, place, and time. She appears well-developed and well-nourished.  Cardiovascular: Normal rate.   Pulmonary/Chest: Effort normal.  Musculoskeletal:       Right ankle: She exhibits decreased range of motion, swelling and ecchymosis. She exhibits no deformity, no laceration and normal pulse. Tenderness (pain out of proportion to physical exam findings, patient allowed me to palpate very superficially before withdrawing foot due to pain). Lateral malleolus and AITFL tenderness found. No medial malleolus, no CF ligament, no posterior TFL, no head of 5th metatarsal and no proximal fibula tenderness found. Achilles tendon exhibits no pain and no defect.  Neurological: She is alert and oriented to person, place, and time.  Skin: Skin is warm and dry.   Dg Ankle Complete Right  Result Date: 12/22/2016 CLINICAL DATA:  Ankle sprain.  Lateral pain and swelling. EXAM: RIGHT ANKLE - COMPLETE 3+ VIEW COMPARISON:  12/08/2016 FINDINGS: Lateral soft tissue swelling. No underlying bony abnormality. No fracture, subluxation or dislocation. IMPRESSION: No acute bony abnormality. Electronically Signed   By: Rolm Baptise M.D.   On: 12/22/2016 12:04   Assessment and Plan :   1. Sprain of right ankle, unspecified ligament, subsequent encounter 2. Pain in joint involving right ankle and foot 3. Right ankle swelling - I recommended patient use the supply of hydrocodone she just filled for her pain. She has fibromyalgia which complicates this ankle sprain. I will refer her to Charyl Dancer for a consult. In the meantime, patient will continue to use her sweedo brace, schedule APAP, use hydrocodone for breakthrough pain.  Jaynee Eagles, PA-C [facility] [phone] 12/22/2016 11:27 AM

## 2016-12-22 NOTE — Patient Instructions (Signed)
Ankle Pain Many things can cause ankle pain, including an injury to the area and overuse of the ankle.The ankle joint holds your body weight and allows you to move around. Ankle pain can occur on either side or the back of one ankle or both ankles. Ankle pain may be sharp and burning or dull and aching. There may be tenderness, stiffness, redness, or warmth around the ankle. Follow these instructions at home: Activity  Rest your ankle as told by your health care provider. Avoid any activities that cause ankle pain.  Do exercises as told by your health care provider.  Ask your health care provider if you can drive. Using a brace, a bandage, or crutches  If you were given a brace: ? Wear it as told by your health care provider. ? Remove it when you take a bath or a shower. ? Try not to move your ankle very much, but wiggle your toes from time to time. This helps to prevent swelling.  If you were given an elastic bandage: ? Remove it when you take a bath or a shower. ? Try not to move your ankle very much, but wiggle your toes from time to time. This helps to prevent swelling. ? Adjust the bandage to make it more comfortable if it feels too tight. ? Loosen the bandage if you have numbness or tingling in your foot or if your foot turns cold and blue.  If you have crutches, use them as told by your health care provider. Continue to use them until you can walk without feeling pain in your ankle. Managing pain, stiffness, and swelling  Raise (elevate) your ankle above the level of your heart while you are sitting or lying down.  If directed, apply ice to the area: ? Put ice in a plastic bag. ? Place a towel between your skin and the bag. ? Leave the ice on for 20 minutes, 2-3 times per day. General instructions  Keep all follow-up visits as told by your health care provider. This is important.  Record this information that may be helpful for you and your health care provider: ? How  often you have ankle pain. ? Where the pain is located. ? What the pain feels like.  Take over-the-counter and prescription medicines only as told by your health care provider. Contact a health care provider if:  Your pain gets worse.  Your pain is not relieved with medicines.  You have a fever or chills.  You are having more trouble with walking.  You have new symptoms. Get help right away if:  Your foot, leg, toes, or ankle tingles or becomes numb.  Your foot, leg, toes, or ankle becomes swollen.  Your foot, leg, toes, or ankle turns pale or blue. This information is not intended to replace advice given to you by your health care provider. Make sure you discuss any questions you have with your health care provider. Document Released: 06/01/2010 Document Revised: 08/12/2016 Document Reviewed: 07/14/2015 Elsevier Interactive Patient Education  2017 Else[employer] Inc[employer]  

## 2017-01-13 ENCOUNTER — Institutional Professional Consult (permissible substitution): Payer: BLUE CROSS/BLUE SHIELD | Admitting: Internal Medicine

## 2017-02-09 ENCOUNTER — Ambulatory Visit (INDEPENDENT_AMBULATORY_CARE_PROVIDER_SITE_OTHER): Payer: Managed Care, Other (non HMO) | Admitting: Internal Medicine

## 2017-02-09 ENCOUNTER — Encounter: Payer: Self-pay | Admitting: Internal Medicine

## 2017-02-09 VITALS — BP 108/86 | HR 59 | Ht 65.0 in | Wt 184.6 lb

## 2017-02-09 DIAGNOSIS — J4530 Mild persistent asthma, uncomplicated: Secondary | ICD-10-CM | POA: Diagnosis not present

## 2017-02-09 LAB — NITRIC OXIDE: Nitric Oxide: 8

## 2017-02-09 NOTE — Patient Instructions (Signed)
ICD-9-CM ICD-10-CM   1. Mild persistent asthma without complication 123456 [id number]     Agree with keeping an eye Continue arnuity If symptoms recur or having worsening symptoms then we can consider CT sinus and chest Curently asthma well controllled  Plan  -return in 6 months or sooner if needed

## 2017-02-09 NOTE — Addendum Note (Signed)
Addended by: Collier Salina on: 02/09/2017 12:13 PM   Modules accepted: Orders

## 2017-02-09 NOTE — Addendum Note (Signed)
Addended by: Collier Salina on: 02/09/2017 01:27 PM   Modules accepted: Orders

## 2017-02-09 NOTE — Progress Notes (Signed)
Subjective:    Patient ID: Maria [name], female    DOB: 07/22/1961, 56 y.o.   MRN: [id number]  HPI   OV 02/09/2017   Chief Complaint  Patient presents with  . Pulmoary Consult    Pt referred by Dr Donneta Romberg for reccurent pna. Pt denies SOB and CP/tightness. Pt c/o recent sinus infection. Pt c/o dry cough.    56 year old female referred by Dr. Donneta Romberg for recurrent pneumonias  She tells me that in 2014 she had community acquired pneumonia.. Review of the chart shows a discharge summary from 06/03/2013 at the same diagnosis. She had another episode of pneumonia in 03/03/2016 discharge. She was hospitalized for both these incidences of pneumonia. At least looking at the March 2017 chest x-ray to me looks clear even though she has a diagnosis of pneumonia. She also tells me that between these episodes of pneumonia she gets recurrent bronchitis or getting exposed to sick coworkers at her job. She thinks is having to frequently although her history she tells me only once a year and she needs antibiotics plus minus prednisone for this. Most recently she had a urine infection is finished Z-Pak. In between these episodes of bronchitis and pneumonia she has chronic stuffy nose associated with mild chronic cough and shortness of breath. Allergy test neative per hx. sHe is on arnuity. Currentlwy well  She wants conservative line of mgmt  No outside records   feno 8ppb 02/09/2017 - suggests very good control on arnuit   Chest x-ray 09/19/2012: Personally visualized and is clear   Hemoglobin of 09/28/2012: 13 g percent  Echocardiogram 2003: Ejection fraction 55% with LVH   has a past medical history of Anxiety; Asthma; Chronic kidney disease (CKD), stage III (moderate); Chronic lower back pain; Daily headache; DKA (diabetic ketoacidoses) ([place]) (admission 03/03/2016); Fibroid; Fibromyalgia; GERD (gastroesophageal reflux disease); HTN (hypertension); Pelvic pain in female; Pneumonia (05/2013); PONV  (postoperative nausea and vomiting); and Renal insufficiency.   reports that she has been smoking Cigarettes.  She has a 6.50 pack-year smoking history. She has never used smokeless tobacco.  Past Surgical History:  Procedure Laterality Date  . BREAST BIOPSY Right   . DILATION AND CURETTAGE OF UTERUS  2008    Allergies  Allergen Reactions  . Advair Diskus [Fluticasone-Salmeterol] Palpitations  . Other Palpitations    "Muscle relaxer that starts with an M" methocarbamol?    Immunization History  Administered Date(s) Administered  . Influenza-Unspecified 08/27/2015, 08/09/2016  . Pneumococcal Polysaccharide-23 03/05/2016  . Tdap 12/08/2016    Family History  Problem Relation Age of Onset  . Cancer Mother   . Cancer Brother   . Hypertension Brother      Current Outpatient Prescriptions:  .  ALPRAZolam (XANAX) 1 MG tablet, Take 1 mg by mouth at bedtime as needed for sleep or anxiety., Disp: , Rfl:  .  ARNUITY ELLIPTA 100 MCG/ACT AEPB, Inhale 1 puff into the lungs daily., Disp: , Rfl: 6 .  Azelastine HCl 0.15 % SOLN, SPRAY TWICE INTO EACH NOSTRIL TWICE A DAY, Disp: , Rfl: 3 .  benzonatate (TESSALON) 100 MG capsule, Take 1-2 capsules (100-200 mg total) by mouth 3 (three) times daily as needed for cough., Disp: 40 capsule, Rfl: 0 .  Blood Glucose Monitoring Suppl (TRUE METRIX METER) DEVI, 1 each by Does not apply route 4 (four) times daily. Generic OK if available. Other brands OK based on price., Disp: 1 Device, Rfl: 0 .  carisoprodol (SOMA) 250 MG tablet, Take 350 mg  by mouth 3 (three) times daily. , Disp: , Rfl:  .  cholecalciferol (VITAMIN D) 1000 UNITS tablet, Take 1,000 Units by mouth daily., Disp: , Rfl:  .  dexlansoprazole (DEXILANT) 60 MG capsule, Take 60 mg by mouth daily., Disp: , Rfl:  .  diltiazem (CARDIZEM CD) 360 MG 24 hr capsule, Take 360 mg by mouth daily., Disp: , Rfl: 2 .  estazolam (PROSOM) 2 MG tablet, Take 2 mg by mouth at bedtime., Disp: , Rfl:  .  fish  oil-omega-3 fatty acids 1000 MG capsule, Take 2 g by mouth daily., Disp: , Rfl:  .  fluticasone (FLONASE) 50 MCG/ACT nasal spray, Place 2 sprays into the nose daily., Disp: , Rfl:  .  gabapentin (NEURONTIN) 100 MG capsule, Take 100 mg by mouth every evening. Reported on 03/03/2016, Disp: , Rfl:  .  glucose blood (TRUE METRIX BLOOD GLUCOSE TEST) test strip, 4 times daily. Generic OK if available. Other brands OK based on price., Disp: 100 each, Rfl: 12 .  HYDROcodone-acetaminophen (NORCO/VICODIN) 5-325 MG tablet, Take 1 tablet by mouth every 6 (six) hours as needed for moderate pain., Disp: , Rfl:  .  hydrocortisone 2.5 % cream, APPLY TO RASH AFTER BATHING, Disp: , Rfl: 3 .  Insulin Pen Needle (PEN NEEDLES) 31G X 5 MM MISC, 1 each by Does not apply route 4 (four) times daily. Generic OK., Disp: 100 each, Rfl: 3 .  levocetirizine (XYZAL) 5 MG tablet, Take 5 mg by mouth every evening., Disp: , Rfl:  .  lidocaine (XYLOCAINE) 5 % ointment, Apply 1 application topically daily as needed for moderate pain. Reported on 03/02/2016, Disp: , Rfl:  .  linaclotide (LINZESS) 145 MCG CAPS capsule, Take 145 mcg by mouth daily before breakfast., Disp: , Rfl:  .  montelukast (SINGULAIR) 10 MG tablet, Take 10 mg by mouth at bedtime., Disp: , Rfl:  .  Multiple Vitamin (MULTIVITAMIN WITH MINERALS) TABS, Take 1 tablet by mouth daily., Disp: , Rfl:  .  nebivolol (BYSTOLIC) 10 MG tablet, Take 5 mg by mouth daily., Disp: , Rfl:  .  sitaGLIPtin-metformin (JANUMET) 50-500 MG tablet, Take 1 tablet by mouth 2 (two) times daily with a meal. , Disp: , Rfl:  .  traMADol (ULTRAM) 50 MG tablet, Take 50 mg by mouth every 6 (six) hours as needed for moderate pain. , Disp: , Rfl:  .  triamterene-hydrochlorothiazide (DYAZIDE) 37.5-25 MG capsule, Take 1 capsule by mouth daily., Disp: , Rfl:  .  TRUEPLUS LANCETS 28G MISC, 1 each by Does not apply route 4 (four) times daily. Generic OK if available. Other brands OK based on price., Disp: 210  each, Rfl: 2 .  valACYclovir (VALTREX) 500 MG tablet, TAKE 1 TABLET BY MOUTH TWICE A DAY FOR 3 DAYS, Disp: , Rfl: 2    Review of Systems  Constitutional: Negative for fever and unexpected weight change.  HENT: Positive for congestion. Negative for dental problem, ear pain, nosebleeds, postnasal drip, rhinorrhea, sinus pressure, sneezing, sore throat and trouble swallowing.   Eyes: Negative for redness and itching.  Respiratory: Positive for cough. Negative for chest tightness, shortness of breath and wheezing.   Cardiovascular: Negative for palpitations and leg swelling.  Gastrointestinal: Negative for nausea and vomiting.  Genitourinary: Negative for dysuria.  Musculoskeletal: Negative for joint swelling.  Skin: Negative for rash.  Neurological: Negative for headaches.  Hematological: Does not bruise/bleed easily.  Psychiatric/Behavioral: Negative for dysphoric mood. The patient is not nervous/anxious.  Objective:   Physical Exam  Constitutional: She is oriented to person, place, and time. She appears well-developed and well-nourished. No distress.  HENT:  Head: Normocephalic and atraumatic.  Right Ear: External ear normal.  Left Ear: External ear normal.  Mouth/Throat: Oropharynx is clear and moist. No oropharyngeal exudate.  Eyes: Conjunctivae and EOM are normal. Pupils are equal, round, and reactive to light. Right eye exhibits no discharge. Left eye exhibits no discharge. No scleral icterus.  Neck: Normal range of motion. Neck supple. No JVD present. No tracheal deviation present. No thyromegaly present.  Cardiovascular: Normal rate, regular rhythm, normal heart sounds and intact distal pulses.  Exam reveals no gallop and no friction rub.   No murmur heard. Pulmonary/Chest: Effort normal and breath sounds normal. No respiratory distress. She has no wheezes. She has no rales. She exhibits no tenderness.  Abdominal: Soft. Bowel sounds are normal. She exhibits no distension  and no mass. There is no tenderness. There is no rebound and no guarding.  Musculoskeletal: Normal range of motion. She exhibits no edema or tenderness.  Lymphadenopathy:    She has no cervical adenopathy.  Neurological: She is alert and oriented to person, place, and time. She has normal reflexes. No cranial nerve deficit. She exhibits normal muscle tone. Coordination normal.  Skin: Skin is warm and dry. No rash noted. She is not diaphoretic. No erythema. No pallor.  Psychiatric: She has a normal mood and affect. Her behavior is normal. Judgment and thought content normal.  Vitals reviewed.   Vitals:   02/09/17 1104  BP: 108/86  Pulse: (!) 59  SpO2: 98%  Weight: 184 lb 9.6 oz (83.7 kg)  Height: 5\' 5"  (1.651 m)    Estimated body mass index is 30.72 kg/m as calculated from the following:   Height as of this encounter: 5\' 5"  (1.651 m).   Weight as of this encounter: 184 lb 9.6 oz (83.7 kg).       Assessment & Plan:     ICD-9-CM ICD-10-CM   1. Mild persistent asthma without complication 123456 [id number]     Agree with keeping an eye Continue arnuity If symptoms recur or having worsening symptoms then we can consider CT sinus and chest Curently asthma well controllled  Plan  -return in 6 months or sooner if needed    Dr. Brand Males, M.D., [facility].C.P Pulmonary and Critical Care Medicine Staff Physician [facility] Pager: [phone], If no answer or between  15:00h - 7:00h: call [phone]  02/09/2017 11:56 AM

## 2017-02-09 NOTE — Addendum Note (Signed)
Addended by: Collier Salina on: 02/09/2017 12:17 PM   Modules accepted: Orders

## 2017-07-25 ENCOUNTER — Other Ambulatory Visit: Payer: Self-pay | Admitting: Obstetrics and Gynecology

## 2017-07-25 DIAGNOSIS — Z1231 Encounter for screening mammogram for malignant neoplasm of breast: Secondary | ICD-10-CM

## 2017-08-08 ENCOUNTER — Ambulatory Visit: Payer: Managed Care, Other (non HMO)

## 2017-09-08 ENCOUNTER — Inpatient Hospital Stay: Admission: RE | Admit: 2017-09-08 | Payer: Managed Care, Other (non HMO) | Source: Ambulatory Visit

## 2017-09-08 ENCOUNTER — Ambulatory Visit: Payer: Managed Care, Other (non HMO)

## 2017-09-08 ENCOUNTER — Ambulatory Visit
Admission: RE | Admit: 2017-09-08 | Discharge: 2017-09-08 | Disposition: A | Discharge status: Home / Self Care | Payer: Managed Care, Other (non HMO) | Source: Ambulatory Visit | Attending: Obstetrics and Gynecology | Admitting: Obstetrics and Gynecology

## 2017-09-08 DIAGNOSIS — Z1231 Encounter for screening mammogram for malignant neoplasm of breast: Secondary | ICD-10-CM

## 2017-10-27 ENCOUNTER — Ambulatory Visit: Payer: BLUE CROSS/BLUE SHIELD | Admitting: Physician Assistant

## 2017-10-31 ENCOUNTER — Other Ambulatory Visit: Payer: Self-pay | Admitting: Internal Medicine

## 2017-10-31 ENCOUNTER — Ambulatory Visit (HOSPITAL_COMMUNITY)
Admission: RE | Admit: 2017-10-31 | Discharge: 2017-10-31 | Disposition: A | Discharge status: Home / Self Care | Payer: Managed Care, Other (non HMO) | Source: Ambulatory Visit | Attending: Internal Medicine | Admitting: Internal Medicine

## 2017-10-31 ENCOUNTER — Ambulatory Visit (HOSPITAL_COMMUNITY): Payer: Managed Care, Other (non HMO)

## 2017-10-31 DIAGNOSIS — X58XXXA Exposure to other specified factors, initial encounter: Secondary | ICD-10-CM | POA: Diagnosis not present

## 2017-10-31 DIAGNOSIS — R52 Pain, unspecified: Secondary | ICD-10-CM

## 2017-10-31 DIAGNOSIS — S92511A Displaced fracture of proximal phalanx of right lesser toe(s), initial encounter for closed fracture: Secondary | ICD-10-CM | POA: Diagnosis not present

## 2017-10-31 DIAGNOSIS — S99921A Unspecified injury of right foot, initial encounter: Secondary | ICD-10-CM | POA: Insufficient documentation

## 2018-04-04 ENCOUNTER — Encounter: Payer: Self-pay | Admitting: Physician Assistant

## 2018-08-21 ENCOUNTER — Other Ambulatory Visit: Payer: Self-pay | Admitting: Obstetrics and Gynecology

## 2018-08-21 DIAGNOSIS — Z1231 Encounter for screening mammogram for malignant neoplasm of breast: Secondary | ICD-10-CM

## 2018-09-24 ENCOUNTER — Ambulatory Visit
Admission: RE | Admit: 2018-09-24 | Discharge: 2018-09-24 | Disposition: A | Discharge status: Home / Self Care | Payer: BLUE CROSS/BLUE SHIELD | Source: Ambulatory Visit | Attending: Obstetrics and Gynecology | Admitting: Obstetrics and Gynecology

## 2018-09-24 DIAGNOSIS — Z1231 Encounter for screening mammogram for malignant neoplasm of breast: Secondary | ICD-10-CM

## 2018-09-26 ENCOUNTER — Other Ambulatory Visit: Payer: Self-pay | Admitting: Obstetrics and Gynecology

## 2018-09-26 DIAGNOSIS — R928 Other abnormal and inconclusive findings on diagnostic imaging of breast: Secondary | ICD-10-CM

## 2018-09-28 ENCOUNTER — Ambulatory Visit
Admission: RE | Admit: 2018-09-28 | Discharge: 2018-09-28 | Disposition: A | Discharge status: Home / Self Care | Payer: BLUE CROSS/BLUE SHIELD | Source: Ambulatory Visit | Attending: Obstetrics and Gynecology | Admitting: Obstetrics and Gynecology

## 2018-09-28 ENCOUNTER — Other Ambulatory Visit: Payer: Self-pay | Admitting: Obstetrics and Gynecology

## 2018-09-28 DIAGNOSIS — R928 Other abnormal and inconclusive findings on diagnostic imaging of breast: Secondary | ICD-10-CM

## 2018-09-28 DIAGNOSIS — N6489 Other specified disorders of breast: Secondary | ICD-10-CM

## 2018-10-03 ENCOUNTER — Other Ambulatory Visit: Payer: Self-pay | Admitting: Obstetrics and Gynecology

## 2018-10-03 ENCOUNTER — Ambulatory Visit
Admission: RE | Admit: 2018-10-03 | Discharge: 2018-10-03 | Disposition: A | Discharge status: Home / Self Care | Payer: BLUE CROSS/BLUE SHIELD | Source: Ambulatory Visit | Attending: Obstetrics and Gynecology | Admitting: Obstetrics and Gynecology

## 2018-10-03 DIAGNOSIS — N6489 Other specified disorders of breast: Secondary | ICD-10-CM

## 2018-10-31 ENCOUNTER — Other Ambulatory Visit: Payer: Self-pay | Admitting: General Surgery

## 2018-10-31 DIAGNOSIS — N6310 Unspecified lump in the right breast, unspecified quadrant: Secondary | ICD-10-CM

## 2018-11-01 ENCOUNTER — Other Ambulatory Visit: Payer: Self-pay | Admitting: General Surgery

## 2018-11-01 DIAGNOSIS — N6310 Unspecified lump in the right breast, unspecified quadrant: Secondary | ICD-10-CM

## 2018-11-07 ENCOUNTER — Other Ambulatory Visit: Payer: Self-pay

## 2018-11-07 ENCOUNTER — Encounter (HOSPITAL_BASED_OUTPATIENT_CLINIC_OR_DEPARTMENT_OTHER): Payer: Self-pay | Admitting: *Deleted

## 2018-11-07 ENCOUNTER — Encounter (HOSPITAL_BASED_OUTPATIENT_CLINIC_OR_DEPARTMENT_OTHER)
Admission: RE | Admit: 2018-11-07 | Discharge: 2018-11-07 | Disposition: A | Discharge status: Home / Self Care | Payer: BLUE CROSS/BLUE SHIELD | Source: Ambulatory Visit | Attending: General Surgery | Admitting: General Surgery

## 2018-11-07 DIAGNOSIS — E119 Type 2 diabetes mellitus without complications: Secondary | ICD-10-CM | POA: Diagnosis not present

## 2018-11-07 DIAGNOSIS — I10 Essential (primary) hypertension: Secondary | ICD-10-CM | POA: Diagnosis not present

## 2018-11-07 DIAGNOSIS — N6310 Unspecified lump in the right breast, unspecified quadrant: Secondary | ICD-10-CM | POA: Diagnosis not present

## 2018-11-07 DIAGNOSIS — Z01818 Encounter for other preprocedural examination: Secondary | ICD-10-CM | POA: Insufficient documentation

## 2018-11-07 LAB — BASIC METABOLIC PANEL
ANION GAP: 9 (ref 5–15)
BUN: 19 mg/dL (ref 6–20)
CALCIUM: 8.9 mg/dL (ref 8.9–10.3)
CO2: 23 mmol/L (ref 22–32)
Chloride: 108 mmol/L (ref 98–111)
Creatinine, Ser: 1.18 mg/dL — ABNORMAL HIGH (ref 0.44–1.00)
GFR calc Af Amer: 59 mL/min — ABNORMAL LOW (ref >60)
GFR calc non Af Amer: 51 mL/min — ABNORMAL LOW (ref >60)
Glucose, Bld: 152 mg/dL — ABNORMAL HIGH (ref 70–99)
Potassium: 4.1 mmol/L (ref 3.5–5.1)
Sodium: 140 mmol/L (ref 135–145)

## 2018-11-07 MED ORDER — ENSURE PRE-SURGERY PO LIQD: 296.0000 mL | Freq: Once | ORAL | Status: DC

## 2018-11-07 NOTE — Progress Notes (Signed)
Ensure pre surgery drink given with instructions to complete by 0930 dos, pt verbalized understanding. 

## 2018-11-08 ENCOUNTER — Ambulatory Visit
Admission: RE | Admit: 2018-11-08 | Discharge: 2018-11-08 | Disposition: A | Discharge status: Home / Self Care | Payer: BLUE CROSS/BLUE SHIELD | Source: Ambulatory Visit | Attending: General Surgery | Admitting: General Surgery

## 2018-11-08 DIAGNOSIS — N6310 Unspecified lump in the right breast, unspecified quadrant: Secondary | ICD-10-CM

## 2018-11-13 ENCOUNTER — Encounter (HOSPITAL_BASED_OUTPATIENT_CLINIC_OR_DEPARTMENT_OTHER)
Admission: RE | Disposition: A | Discharge status: Home / Self Care | Payer: Self-pay | Source: Ambulatory Visit | Attending: General Surgery

## 2018-11-13 ENCOUNTER — Ambulatory Visit (HOSPITAL_BASED_OUTPATIENT_CLINIC_OR_DEPARTMENT_OTHER): Payer: BLUE CROSS/BLUE SHIELD | Admitting: Certified Registered"

## 2018-11-13 ENCOUNTER — Ambulatory Visit
Admission: RE | Admit: 2018-11-13 | Discharge: 2018-11-13 | Disposition: A | Discharge status: Home / Self Care | Payer: BLUE CROSS/BLUE SHIELD | Source: Ambulatory Visit | Attending: General Surgery | Admitting: General Surgery

## 2018-11-13 ENCOUNTER — Other Ambulatory Visit: Payer: Self-pay

## 2018-11-13 ENCOUNTER — Ambulatory Visit (HOSPITAL_BASED_OUTPATIENT_CLINIC_OR_DEPARTMENT_OTHER)
Admission: RE | Admit: 2018-11-13 | Discharge: 2018-11-13 | Disposition: A | Discharge status: Home / Self Care | Payer: BLUE CROSS/BLUE SHIELD | Source: Ambulatory Visit | Attending: General Surgery | Admitting: General Surgery

## 2018-11-13 ENCOUNTER — Encounter (HOSPITAL_BASED_OUTPATIENT_CLINIC_OR_DEPARTMENT_OTHER): Payer: Self-pay

## 2018-11-13 DIAGNOSIS — E119 Type 2 diabetes mellitus without complications: Secondary | ICD-10-CM | POA: Diagnosis not present

## 2018-11-13 DIAGNOSIS — Z7984 Long term (current) use of oral hypoglycemic drugs: Secondary | ICD-10-CM | POA: Insufficient documentation

## 2018-11-13 DIAGNOSIS — K219 Gastro-esophageal reflux disease without esophagitis: Secondary | ICD-10-CM | POA: Insufficient documentation

## 2018-11-13 DIAGNOSIS — Z79899 Other long term (current) drug therapy: Secondary | ICD-10-CM | POA: Insufficient documentation

## 2018-11-13 DIAGNOSIS — F17200 Nicotine dependence, unspecified, uncomplicated: Secondary | ICD-10-CM | POA: Insufficient documentation

## 2018-11-13 DIAGNOSIS — D241 Benign neoplasm of right breast: Secondary | ICD-10-CM | POA: Diagnosis not present

## 2018-11-13 DIAGNOSIS — N6310 Unspecified lump in the right breast, unspecified quadrant: Secondary | ICD-10-CM

## 2018-11-13 DIAGNOSIS — J45909 Unspecified asthma, uncomplicated: Secondary | ICD-10-CM | POA: Diagnosis not present

## 2018-11-13 HISTORY — PX: RADIOACTIVE SEED GUIDED EXCISIONAL BREAST BIOPSY: SHX6490

## 2018-11-13 LAB — GLUCOSE, CAPILLARY
GLUCOSE-CAPILLARY: 185 mg/dL — AB (ref 70–99)
Glucose-Capillary: 201 mg/dL — ABNORMAL HIGH (ref 70–99)

## 2018-11-13 SURGERY — RADIOACTIVE SEED GUIDED BREAST BIOPSY
Anesthesia: General | Site: Breast | Laterality: Right

## 2018-11-13 MED ORDER — SCOPOLAMINE 1 MG/3DAYS TD PT72: 1.0000 | MEDICATED_PATCH | Freq: Once | TRANSDERMAL | Status: DC | PRN

## 2018-11-13 MED ORDER — SUCCINYLCHOLINE CHLORIDE 20 MG/ML IJ SOLN
INTRAMUSCULAR | Status: DC | PRN
  Administered 2018-11-13: 120 mg via INTRAVENOUS

## 2018-11-13 MED ORDER — PROMETHAZINE HCL 25 MG/ML IJ SOLN: 6.2500 mg | INTRAMUSCULAR | Status: DC | PRN

## 2018-11-13 MED ORDER — CEFAZOLIN SODIUM-DEXTROSE 2-4 GM/100ML-% IV SOLN: 2.0000 g | INTRAVENOUS | Status: DC

## 2018-11-13 MED ORDER — PROPOFOL 10 MG/ML IV BOLUS
INTRAVENOUS | Status: DC | PRN
  Administered 2018-11-13: 150 mg via INTRAVENOUS

## 2018-11-13 MED ORDER — ACETAMINOPHEN 500 MG PO TABS
ORAL_TABLET | ORAL | Status: AC
  Filled 2018-11-13: qty 2

## 2018-11-13 MED ORDER — CEFAZOLIN SODIUM-DEXTROSE 2-4 GM/100ML-% IV SOLN
INTRAVENOUS | Status: AC
  Filled 2018-11-13: qty 100

## 2018-11-13 MED ORDER — GLYCOPYRROLATE 0.2 MG/ML IJ SOLN
INTRAMUSCULAR | Status: DC | PRN
  Administered 2018-11-13: 0.2 mg via INTRAVENOUS

## 2018-11-13 MED ORDER — SCOPOLAMINE 1 MG/3DAYS TD PT72
1.0000 | MEDICATED_PATCH | Freq: Once | TRANSDERMAL | Status: DC
  Administered 2018-11-13: 1.5 mg via TRANSDERMAL

## 2018-11-13 MED ORDER — LIDOCAINE HCL (CARDIAC) PF 100 MG/5ML IV SOSY
PREFILLED_SYRINGE | INTRAVENOUS | Status: DC | PRN
  Administered 2018-11-13: 40 mg via INTRAVENOUS

## 2018-11-13 MED ORDER — BUPIVACAINE HCL (PF) 0.25 % IJ SOLN
INTRAMUSCULAR | Status: DC | PRN
  Administered 2018-11-13: 10 mL

## 2018-11-13 MED ORDER — EPHEDRINE 5 MG/ML INJ
INTRAVENOUS | Status: AC
  Filled 2018-11-13: qty 30

## 2018-11-13 MED ORDER — FENTANYL CITRATE (PF) 100 MCG/2ML IJ SOLN
25.0000 ug | INTRAMUSCULAR | Status: DC | PRN
  Administered 2018-11-13: 50 ug via INTRAVENOUS
  Administered 2018-11-13: 25 ug via INTRAVENOUS

## 2018-11-13 MED ORDER — MIDAZOLAM HCL 2 MG/2ML IJ SOLN
INTRAMUSCULAR | Status: AC
  Filled 2018-11-13: qty 2

## 2018-11-13 MED ORDER — KETOROLAC TROMETHAMINE 15 MG/ML IJ SOLN
INTRAMUSCULAR | Status: AC
  Filled 2018-11-13: qty 1

## 2018-11-13 MED ORDER — FENTANYL CITRATE (PF) 100 MCG/2ML IJ SOLN
INTRAMUSCULAR | Status: AC
  Filled 2018-11-13: qty 2

## 2018-11-13 MED ORDER — CEFAZOLIN SODIUM-DEXTROSE 2-4 GM/100ML-% IV SOLN
2.0000 g | INTRAVENOUS | Status: AC
  Administered 2018-11-13: 2 g via INTRAVENOUS

## 2018-11-13 MED ORDER — KETOROLAC TROMETHAMINE 30 MG/ML IJ SOLN
INTRAMUSCULAR | Status: AC
  Filled 2018-11-13: qty 2

## 2018-11-13 MED ORDER — GABAPENTIN 100 MG PO CAPS
100.0000 mg | ORAL_CAPSULE | ORAL | Status: AC
  Administered 2018-11-13: 100 mg via ORAL

## 2018-11-13 MED ORDER — SCOPOLAMINE 1 MG/3DAYS TD PT72
MEDICATED_PATCH | TRANSDERMAL | Status: AC
  Filled 2018-11-13: qty 1

## 2018-11-13 MED ORDER — ONDANSETRON HCL 4 MG/2ML IJ SOLN
INTRAMUSCULAR | Status: DC | PRN
  Administered 2018-11-13: 4 mg via INTRAVENOUS

## 2018-11-13 MED ORDER — SUCCINYLCHOLINE CHLORIDE 200 MG/10ML IV SOSY
PREFILLED_SYRINGE | INTRAVENOUS | Status: AC
  Filled 2018-11-13: qty 10

## 2018-11-13 MED ORDER — KETOROLAC TROMETHAMINE 15 MG/ML IJ SOLN
15.0000 mg | INTRAMUSCULAR | Status: AC
  Administered 2018-11-13: 15 mg via INTRAVENOUS

## 2018-11-13 MED ORDER — ACETAMINOPHEN 500 MG PO TABS
1000.0000 mg | ORAL_TABLET | ORAL | Status: AC
  Administered 2018-11-13: 1000 mg via ORAL

## 2018-11-13 MED ORDER — MIDAZOLAM HCL 2 MG/2ML IJ SOLN: 0.5000 mg | Freq: Once | INTRAMUSCULAR | Status: DC | PRN

## 2018-11-13 MED ORDER — MEPERIDINE HCL 25 MG/ML IJ SOLN: 6.2500 mg | INTRAMUSCULAR | Status: DC | PRN

## 2018-11-13 MED ORDER — EPHEDRINE SULFATE 50 MG/ML IJ SOLN
INTRAMUSCULAR | Status: DC | PRN
  Administered 2018-11-13 (×6): 10 mg via INTRAVENOUS

## 2018-11-13 MED ORDER — ONDANSETRON HCL 4 MG/2ML IJ SOLN
INTRAMUSCULAR | Status: AC
  Filled 2018-11-13: qty 2

## 2018-11-13 MED ORDER — DEXAMETHASONE SODIUM PHOSPHATE 10 MG/ML IJ SOLN
INTRAMUSCULAR | Status: AC
  Filled 2018-11-13: qty 1

## 2018-11-13 MED ORDER — TRAMADOL HCL 50 MG PO TABS: 50.0000 mg | ORAL_TABLET | Freq: Four times a day (QID) | ORAL | 0 refills | Status: DC | PRN

## 2018-11-13 MED ORDER — MIDAZOLAM HCL 2 MG/2ML IJ SOLN
1.0000 mg | INTRAMUSCULAR | Status: DC | PRN
  Administered 2018-11-13: 2 mg via INTRAVENOUS

## 2018-11-13 MED ORDER — GABAPENTIN 100 MG PO CAPS
ORAL_CAPSULE | ORAL | Status: AC
  Filled 2018-11-13: qty 1

## 2018-11-13 MED ORDER — LACTATED RINGERS IV SOLN
INTRAVENOUS | Status: DC
  Administered 2018-11-13 (×3): via INTRAVENOUS

## 2018-11-13 MED ORDER — FENTANYL CITRATE (PF) 100 MCG/2ML IJ SOLN
50.0000 ug | INTRAMUSCULAR | Status: DC | PRN
  Administered 2018-11-13: 100 ug via INTRAVENOUS

## 2018-11-13 SURGICAL SUPPLY — 65 items
ADH SKN CLS APL DERMABOND .7 (GAUZE/BANDAGES/DRESSINGS) ×1
APPLIER CLIP 9.375 MED OPEN (MISCELLANEOUS)
APR CLP MED 9.3 20 MLT OPN (MISCELLANEOUS)
BINDER BREAST LRG (GAUZE/BANDAGES/DRESSINGS) IMPLANT
BINDER BREAST MEDIUM (GAUZE/BANDAGES/DRESSINGS) IMPLANT
BINDER BREAST XLRG (GAUZE/BANDAGES/DRESSINGS) IMPLANT
BINDER BREAST XXLRG (GAUZE/BANDAGES/DRESSINGS) ×2 IMPLANT
BLADE SURG 15 STRL LF DISP TIS (BLADE) ×1 IMPLANT
BLADE SURG 15 STRL SS (BLADE) ×3
CANISTER SUC SOCK COL 7IN (MISCELLANEOUS) ×2 IMPLANT
CANISTER SUCT 1200ML W/VALVE (MISCELLANEOUS) ×2 IMPLANT
CHLORAPREP W/TINT 26ML (MISCELLANEOUS) ×3 IMPLANT
CLIP APPLIE 9.375 MED OPEN (MISCELLANEOUS) IMPLANT
CLIP VESOCCLUDE SM WIDE 6/CT (CLIP) IMPLANT
CLOSURE WOUND 1/2 X4 (GAUZE/BANDAGES/DRESSINGS) ×1
COVER BACK TABLE 60X90IN (DRAPES) ×3 IMPLANT
COVER MAYO STAND STRL (DRAPES) ×3 IMPLANT
COVER PROBE W GEL 5X96 (DRAPES) ×3 IMPLANT
COVER WAND RF STERILE (DRAPES) IMPLANT
DECANTER SPIKE VIAL GLASS SM (MISCELLANEOUS) ×2 IMPLANT
DERMABOND ADVANCED (GAUZE/BANDAGES/DRESSINGS) ×2
DERMABOND ADVANCED .7 DNX12 (GAUZE/BANDAGES/DRESSINGS) ×1 IMPLANT
DEVICE DUBIN W/COMP PLATE 8390 (MISCELLANEOUS) ×3 IMPLANT
DRAPE LAPAROSCOPIC ABDOMINAL (DRAPES) ×3 IMPLANT
DRAPE UTILITY XL STRL (DRAPES) ×3 IMPLANT
DRSG TEGADERM 4X4.75 (GAUZE/BANDAGES/DRESSINGS) IMPLANT
ELECT COATED BLADE 2.86 ST (ELECTRODE) ×3 IMPLANT
ELECT REM PT RETURN 9FT ADLT (ELECTROSURGICAL) ×3
ELECTRODE REM PT RTRN 9FT ADLT (ELECTROSURGICAL) ×1 IMPLANT
GAUZE SPONGE 4X4 12PLY STRL LF (GAUZE/BANDAGES/DRESSINGS) IMPLANT
GLOVE BIO SURGEON STRL SZ7 (GLOVE) ×6 IMPLANT
GLOVE BIOGEL PI IND STRL 7.0 (GLOVE) IMPLANT
GLOVE BIOGEL PI IND STRL 7.5 (GLOVE) ×1 IMPLANT
GLOVE BIOGEL PI INDICATOR 7.0 (GLOVE) ×4
GLOVE BIOGEL PI INDICATOR 7.5 (GLOVE) ×2
GLOVE SURG SYN 7.5  E (GLOVE) ×2
GLOVE SURG SYN 7.5 E (GLOVE) ×1 IMPLANT
GLOVE SURG SYN 7.5 PF PI (GLOVE) IMPLANT
GOWN STRL REUS W/ TWL LRG LVL3 (GOWN DISPOSABLE) ×2 IMPLANT
GOWN STRL REUS W/TWL LRG LVL3 (GOWN DISPOSABLE) ×6
HEMOSTAT ARISTA ABSORB 3G PWDR (MISCELLANEOUS) IMPLANT
ILLUMINATOR WAVEGUIDE N/F (MISCELLANEOUS) ×2 IMPLANT
KIT MARKER MARGIN INK (KITS) ×3 IMPLANT
LIGHT WAVEGUIDE WIDE FLAT (MISCELLANEOUS) IMPLANT
NDL HYPO 25X1 1.5 SAFETY (NEEDLE) ×1 IMPLANT
NEEDLE HYPO 25X1 1.5 SAFETY (NEEDLE) ×3 IMPLANT
NS IRRIG 1000ML POUR BTL (IV SOLUTION) IMPLANT
PACK BASIN DAY SURGERY FS (CUSTOM PROCEDURE TRAY) ×3 IMPLANT
PENCIL BUTTON HOLSTER BLD 10FT (ELECTRODE) ×3 IMPLANT
SLEEVE SCD COMPRESS KNEE MED (MISCELLANEOUS) ×3 IMPLANT
SPONGE LAP 4X18 RFD (DISPOSABLE) ×3 IMPLANT
STRIP CLOSURE SKIN 1/2X4 (GAUZE/BANDAGES/DRESSINGS) ×2 IMPLANT
SUT MNCRL AB 4-0 PS2 18 (SUTURE) IMPLANT
SUT MON AB 5-0 PS2 18 (SUTURE) ×2 IMPLANT
SUT SILK 2 0 SH (SUTURE) IMPLANT
SUT VIC AB 2-0 SH 27 (SUTURE) ×3
SUT VIC AB 2-0 SH 27XBRD (SUTURE) ×1 IMPLANT
SUT VIC AB 3-0 SH 27 (SUTURE) ×3
SUT VIC AB 3-0 SH 27X BRD (SUTURE) ×1 IMPLANT
SYR CONTROL 10ML LL (SYRINGE) ×3 IMPLANT
TOWEL GREEN STERILE FF (TOWEL DISPOSABLE) ×3 IMPLANT
TOWEL OR NON WOVEN STRL DISP B (DISPOSABLE) ×3 IMPLANT
TUBE CONNECTING 20'X1/4 (TUBING) ×1
TUBE CONNECTING 20X1/4 (TUBING) ×1 IMPLANT
YANKAUER SUCT BULB TIP NO VENT (SUCTIONS) ×2 IMPLANT

## 2018-11-13 NOTE — Anesthesia Preprocedure Evaluation (Signed)
Anesthesia Evaluation  Patient identified by MRN, date of birth, ID band Patient awake    Reviewed: Allergy & Precautions, NPO status , Patient's Chart, lab work & pertinent test results  History of Anesthesia Complications (+) PONV  Airway Mallampati: II  TM Distance: >3 FB Neck ROM: Full    Dental  (+) Dental Advisory Given   Pulmonary asthma (last inhaler needed a few months ago) , Current Smoker,    breath sounds clear to auscultation       Cardiovascular hypertension, Pt. on medications (-) angina Rhythm:Regular Rate:Normal     Neuro/Psych  Headaches, Anxiety Chronic low back pain    GI/Hepatic Neg liver ROS, GERD  Medicated and Controlled,  Endo/Other  diabetes (glu 201), Oral Hypoglycemic Agentsobese  Renal/GU Renal InsufficiencyRenal disease (creat 1.18)     Musculoskeletal   Abdominal (+) + obese,   Peds  Hematology negative hematology ROS (+)   Anesthesia Other Findings   Reproductive/Obstetrics                             Anesthesia Physical Anesthesia Plan  ASA: III  Anesthesia Plan: General   Post-op Pain Management:    Induction: Intravenous  PONV Risk Score and Plan: 3 and Scopolamine patch - Pre-op, Dexamethasone and Ondansetron  Airway Management Planned: LMA  Additional Equipment:   Intra-op Plan:   Post-operative Plan:   Informed Consent: I have reviewed the patients History and Physical, chart, labs and discussed the procedure including the risks, benefits and alternatives for the proposed anesthesia with the patient or authorized representative who has indicated his/her understanding and acceptance.   Dental advisory given  Plan Discussed with: CRNA and Surgeon  Anesthesia Plan Comments: (Plan routine monitors, GETA)        Anesthesia Quick Evaluation

## 2018-11-13 NOTE — Anesthesia Postprocedure Evaluation (Signed)
Anesthesia Post Note  Patient: Maria [name]  Procedure(s) Performed: RIGHT BREAST RADIOACTIVE SEED GUIDED EXCISIONAL BIOPSY (Right Breast)     Patient location during evaluation: PACU Anesthesia Type: General Level of consciousness: awake and alert, oriented and patient cooperative Pain management: pain level controlled Vital Signs Assessment: post-procedure vital signs reviewed and stable Respiratory status: spontaneous breathing, nonlabored ventilation and respiratory function stable Cardiovascular status: blood pressure returned to baseline and stable Postop Assessment: no apparent nausea or vomiting Anesthetic complications: no    Last Vitals:  Vitals:   11/13/18 1211 11/13/18 1215  BP: 107/63 102/67  Pulse: 60 60  Resp:  (!) 22  Temp: 36.4 C   SpO2: 100% 100%    Last Pain:  Vitals:   11/13/18 1211  TempSrc:   PainSc: 0-No pain                 Teddy Rebstock,E. Mattilynn Forrer

## 2018-11-13 NOTE — H&P (Signed)
31 yof referred by Dr Marlou Sa for right breast distortion that I saw previously. 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 she had no mass or dc. underwent routine screening mm with finding of c density breasts. she had possible right breast distortion. this underwent additional views that shows are of focal density and architectural distortion with coarse calcs. this is near but not at the prior biopsy clip. US shows a hypoechoic shadowing lesion measuring 1.6x0.9x1.6 cm. there are no abnl nodes. US guided biopsy was performed. clip films show that the heart shaped clip is at the position of shadowing which corresponds to the area of architectural distortion. the path is mild fibrocystic changes and this was deemed discordant by radiology. she was referred for evaluation. she states that since biopsy her breast feels like it weighs 300 lbs and she is limited in activity due to the pain. this is much better since last visit. she is here to discuss surgery  Past Surgical History Breast Biopsy  Right.  Diagnostic Studies History  Colonoscopy  5-10 years ago  Allergies  No Known Drug Allergies  Allergies Reconciled   Medication History Gabapentin (100MG  Tablet, Oral) Active. Rosuvastatin Calcium (5MG  Tablet, Oral) Active. Diltiazem HCl (360MG /24HR Capsule ER, Oral) Active. traMADol HCl (50MG  Tablet, Oral) Active. Carisoprodol (350MG  Tablet, Oral) Active. Janumet (50-500MG  Tablet, Oral) Active. HYDROcodone-Acetaminophen (5-325MG  Tablet, Oral) Active. Atenolol (25MG  Tablet, Oral) Active. Levocetirizine Dihydrochloride (5MG  Tablet, Oral) Active. Montelukast Sodium (10MG  Tablet, Oral) Active. ALPRAZolam (1MG  Tablet, Oral) Active. Triamterene-HCTZ (37.5-25MG  Tablet, Oral) Active. Fish Oil (1000MG  Capsule DR, Oral) Active. Multi-Vitamin (Oral) Active. Turmeric (500MG  Tablet, Oral) Active. [employer] Vitamin B12 (1000MCG Tablet, Oral) Active. Medications  Reconciled  Social History  Tobacco use  Current some day smoker.  Family History  Alcohol Abuse  Father. Breast Cancer  Mother. Diabetes Mellitus  Father, Mother. Hypertension  Brother, Father.  Pregnancy / Birth History  Age at menarche  73 years. Age of menopause  [age]  Other Problems Asthma  Diabetes Mellitus  Gastroesophageal Reflux Disease   Review of Systems General Present- Weight Gain. Not Present- Appetite Loss, Chills, Fatigue, Fever, Night Sweats and Weight Loss. Skin Present- Dryness. Not Present- Change in Wart/Mole, Hives, Jaundice, New Lesions, Non-Healing Wounds, Rash and Ulcer. Respiratory Not Present- Bloody sputum, Chronic Cough, Difficulty Breathing, Snoring and Wheezing. Breast Present- Breast Pain. Not Present- Breast Mass, Nipple Discharge and Skin Changes. Cardiovascular Not Present- Chest Pain, Difficulty Breathing Lying Down, Leg Cramps, Palpitations, Rapid Heart Rate, Shortness of Breath and Swelling of Extremities. Gastrointestinal Present- Constipation. Not Present- Abdominal Pain, Bloating, Bloody Stool, Change in Bowel Habits, Chronic diarrhea, Difficulty Swallowing, Excessive gas, Gets full quickly at meals, Hemorrhoids, Indigestion, Nausea, Rectal Pain and Vomiting. Female Genitourinary Not Present- Frequency, Nocturia, Painful Urination, Pelvic Pain and Urgency. Musculoskeletal Present- Back Pain and Joint Stiffness. Not Present- Joint Pain, Muscle Pain, Muscle Weakness and Swelling of Extremities. Psychiatric Present- Anxiety. Not Present- Bipolar, Change in Sleep Pattern, Depression, Fearful and Frequent crying. Endocrine Present- Hot flashes. Not Present- Cold Intolerance, Excessive Hunger, Hair Changes, Heat Intolerance and New Diabetes.  Vitals  Weight: 185 lb Height: 65in Body Surface Area: 1.91 m Body Mass Index: 30.79 kg/m  Temp.: 98.88F  BP: 118/84 (Sitting, Left Arm, Standard) Physical Exam  General Mental  Status-Alert. Orientation-Oriented X3. Head and Neck Trachea-midline. Thyroid Gland Characteristics - normal size and consistency. Eye Sclera/Conjunctiva - Bilateral-No scleral icterus. Breast Nipples-No Discharge. Breast Lump-No Palpable Breast Mass. Note: right breast very tender with hematoma,  no infection, this is much more tender than I would expect Lymphatic Head & Neck General Head & Neck Lymphatics: Bilateral - Description - Normal. Axillary General Axillary Region: Bilateral - Description - Normal. Note: no [place] adenopathy  ABNORMAL MAMMOGRAM (R92.8) Story: Right breast seed guided excisional biopsy we discussed surgery again today due to concerning mass with discordant biopsy and she is at higher risk due to fh. discussed seed placement, surgery, risk and recovery. will proceed soon

## 2018-11-13 NOTE — Anesthesia Procedure Notes (Signed)
Procedure Name: Intubation Performed by: Verita Lamb, CRNA Pre-anesthesia Checklist: Patient identified, Emergency Drugs available, Suction available, Patient being monitored and Timeout performed Patient Re-evaluated:Patient Re-evaluated prior to induction Oxygen Delivery Method: Circle system utilized Preoxygenation: Pre-oxygenation with 100% oxygen Induction Type: IV induction, Rapid sequence and Cricoid Pressure applied Laryngoscope Size: Mac and 4 Grade View: Grade I Tube type: Oral Tube size: 7.0 mm Number of attempts: 1 Placement Confirmation: ETT inserted through vocal cords under direct vision,  positive ETCO2,  CO2 detector and breath sounds checked- equal and bilateral Secured at: 22 cm Dental Injury: Teeth and Oropharynx as per pre-operative assessment

## 2018-11-13 NOTE — Discharge Instructions (Signed)
[place] Office Phone Number [phone]   POST OP INSTRUCTIONS Take 400 mg of ibuprofen every 8 hours or 650 mg tylenol every 6 hours for next 72 hours then as needed. Use ice several times daily also. Always review your discharge instruction sheet given to you by the facility where your surgery was performed.  IF YOU HAVE DISABILITY OR FAMILY LEAVE FORMS, YOU MUST BRING THEM TO THE OFFICE FOR PROCESSING.  DO NOT GIVE THEM TO YOUR DOCTOR.  1. A prescription for pain medication may be given to you upon discharge.  Take your pain medication as prescribed, if needed.  If narcotic pain medicine is not needed, then you may take acetaminophen (Tylenol), naprosyn (Alleve) or ibuprofen (Advil) as needed. 2. Take your usually prescribed medications unless otherwise directed 3. If you need a refill on your pain medication, please contact your pharmacy.  They will contact our office to request authorization.  Prescriptions will not be filled after 5pm or on week-ends. 4. You should eat very light the first 24 hours after surgery, such as soup, crackers, pudding, etc.  Resume your normal diet the day after surgery. 5. Most patients will experience some swelling and bruising in the breast.  Ice packs and a good support bra will help.  Wear the breast binder provided or a sports bra for 72 hours day and night.  After that wear a sports bra during the day until you return to the office. Swelling and bruising can take several days to resolve.  6. It is common to experience some constipation if taking pain medication after surgery.  Increasing fluid intake and taking a stool softener will usually help or prevent this problem from occurring.  A mild laxative (Milk of Magnesia or Miralax) should be taken according to package directions if there are no bowel movements after 48 hours. 7. Unless discharge instructions indicate otherwise, you may remove your bandages 48 hours after surgery and you may  shower at that time.  You may have steri-strips (small skin tapes) in place directly over the incision.  These strips should be left on the skin for 7-10 days and will come off on their own.  If your surgeon used skin glue on the incision, you may shower in 24 hours.  The glue will flake off over the next 2-3 weeks.  Any sutures or staples will be removed at the office during your follow-up visit. 8. ACTIVITIES:  You may resume regular daily activities (gradually increasing) beginning the next day.  Wearing a good support bra or sports bra minimizes pain and swelling.  You may have sexual intercourse when it is comfortable. a. You may drive when you no longer are taking prescription pain medication, you can comfortably wear a seatbelt, and you can safely maneuver your car and apply brakes. b. RETURN TO WORK:  ______________________________________________________________________________________ 9. You should see your doctor in the office for a follow-up appointment approximately two weeks after your surgery.  Your doctors nurse will typically make your follow-up appointment when she calls you with your pathology report.  Expect your pathology report 3-4 business days after your surgery.  You may call to check if you do not hear from [place] after three days. 10. OTHER INSTRUCTIONS: _______________________________________________________________________________________________ _____________________________________________________________________________________________________________________________________ _____________________________________________________________________________________________________________________________________ _____________________________________________________________________________________________________________________________________  WHEN TO CALL DR WAKEFIELD: 1. Fever over 101.0 2. Nausea and/or vomiting. 3. Extreme swelling or bruising. 4. Continued bleeding from  incision. 5. Increased pain, redness, or drainage from the incision.  The clinic staff is available  to answer your questions during regular business hours.  Please dont hesitate to call and ask to speak to one of the nurses for clinical concerns.  If you have a medical emergency, go to the nearest emergency room or call 911.  A surgeon from [facility] is always on call at the hospital.      Post Anesthesia Home Care Instructions  Activity: Get plenty of rest for the remainder of the day. A responsible individual must stay with you for 24 hours following the procedure.  For the next 24 hours, DO NOT: -Drive a car -Paediatric nurse -Drink alcoholic beverages -Take any medication unless instructed by your physician -Make any legal decisions or sign important papers.  Meals: Start with liquid foods such as gelatin or soup. Progress to regular foods as tolerated. Avoid greasy, spicy, heavy foods. If nausea and/or vomiting occur, drink only clear liquids until the nausea and/or vomiting subsides. Call your physician if vomiting continues.  Special Instructions/Symptoms: Your throat may feel dry or sore from the anesthesia or the breathing tube placed in your throat during surgery. If this causes discomfort, gargle with warm salt water. The discomfort should disappear within 24 hours.  If you had a scopolamine patch placed behind your ear for the management of post- operative nausea and/or vomiting:  1. The medication in the patch is effective for 72 hours, after which it should be removed.  Wrap patch in a tissue and discard in the trash. Wash hands thoroughly with soap and water. 2. You may remove the patch earlier than 72 hours if you experience unpleasant side effects which may include dry mouth, dizziness or visual disturbances. 3. Avoid touching the patch. Wash your hands with soap and water after contact with the patch.

## 2018-11-13 NOTE — Transfer of Care (Signed)
Immediate Anesthesia Transfer of Care Note  Patient: Maria [name]  Procedure(s) Performed: RIGHT BREAST RADIOACTIVE SEED GUIDED EXCISIONAL BIOPSY (Right Breast)  Patient Location: PACU  Anesthesia Type:General  Level of Consciousness: awake, alert  and oriented  Airway & Oxygen Therapy: Patient Spontanous Breathing and Patient connected to face mask oxygen  Post-op Assessment: Report given to RN and Post -op Vital signs reviewed and stable  Post vital signs: Reviewed and stable  Last Vitals:  Vitals Value Taken Time  BP 107/63 11/13/2018 12:11 PM  Temp    Pulse 59 11/13/2018 12:14 PM  Resp 23 11/13/2018 12:14 PM  SpO2 100 % 11/13/2018 12:14 PM  Vitals shown include unvalidated device data.  Last Pain:  Vitals:   11/13/18 0956  TempSrc: Oral         Complications: No apparent anesthesia complications

## 2018-11-13 NOTE — Op Note (Signed)
Preoperative diagnosis: Right breast mammographic abnormality with discordant core biopsy Postoperative diagnosis: Same as above Procedure: Right breast radioactive seed guided excisional biopsy Surgeon: Dr. Serita Grammes Anesthesia: General Specimens: Right breast marked with paint containing seed and new clip and old clip Estimated blood loss: Minimal Complications: None Drains: None Special count was correct at completion Disposition to recovery in stable condition  Indications: This a 29 yof with abnormal right breast distortion on mammogram.  Core biopsy was discordant. We discussed a seed guided excisional biopsy.  Procedure: After informed consent was obtained the patient first was given antibiotics. SCDs were placed. She had had a radioactive seed placed prior to beginning. I had these mammograms available in the operating room. She was then placed under general anesthesia without complication. Her breast was prepped and draped in the standard sterile surgical fashion. Surgical timeout was then performed.  I infiltrated Marcaine in the periareolar region. I then made a periareolar incision in order to hide the scar later. I used the neoprobe to guide me to the radioactive seed and I removed the seed and the surrounding tissue. Mammogram confirmed removal of the seed as well as the clip. The old prior clip was also removed. Hemostasis was observed. I closed this with 2-0 Vicryl, 3-0 Vicryl, and 5-0 Monocryl. Glue  was applied. She tolerated this well was extubated and transferred to recovery stable.

## 2018-11-14 ENCOUNTER — Encounter (HOSPITAL_BASED_OUTPATIENT_CLINIC_OR_DEPARTMENT_OTHER): Payer: Self-pay | Admitting: General Surgery

## 2019-03-28 ENCOUNTER — Other Ambulatory Visit (HOSPITAL_COMMUNITY): Payer: Self-pay | Admitting: Neurosurgery

## 2019-03-28 ENCOUNTER — Ambulatory Visit (HOSPITAL_COMMUNITY)
Admission: RE | Admit: 2019-03-28 | Discharge: 2019-03-28 | Disposition: A | Discharge status: Home / Self Care | Payer: BLUE CROSS/BLUE SHIELD | Source: Ambulatory Visit | Attending: Family | Admitting: Family

## 2019-03-28 ENCOUNTER — Other Ambulatory Visit: Payer: Self-pay

## 2019-03-28 DIAGNOSIS — R609 Edema, unspecified: Secondary | ICD-10-CM

## 2019-07-09 ENCOUNTER — Ambulatory Visit
Admission: RE | Admit: 2019-07-09 | Discharge: 2019-07-09 | Disposition: A | Discharge status: Home / Self Care | Payer: BLUE CROSS/BLUE SHIELD | Source: Ambulatory Visit | Attending: Internal Medicine | Admitting: Internal Medicine

## 2019-07-09 ENCOUNTER — Other Ambulatory Visit: Payer: Self-pay | Admitting: Internal Medicine

## 2019-07-09 DIAGNOSIS — J189 Pneumonia, unspecified organism: Secondary | ICD-10-CM

## 2019-09-13 ENCOUNTER — Other Ambulatory Visit: Payer: Self-pay | Admitting: Obstetrics and Gynecology

## 2019-09-13 DIAGNOSIS — Z1231 Encounter for screening mammogram for malignant neoplasm of breast: Secondary | ICD-10-CM

## 2019-10-25 ENCOUNTER — Ambulatory Visit
Admission: RE | Admit: 2019-10-25 | Discharge: 2019-10-25 | Disposition: A | Discharge status: Home / Self Care | Payer: BC Managed Care – PPO | Source: Ambulatory Visit | Attending: Obstetrics and Gynecology | Admitting: Obstetrics and Gynecology

## 2019-10-25 ENCOUNTER — Other Ambulatory Visit: Payer: Self-pay

## 2019-10-25 DIAGNOSIS — Z1231 Encounter for screening mammogram for malignant neoplasm of breast: Secondary | ICD-10-CM

## 2020-09-03 ENCOUNTER — Other Ambulatory Visit: Payer: Self-pay | Admitting: Obstetrics and Gynecology

## 2020-09-03 DIAGNOSIS — Z1231 Encounter for screening mammogram for malignant neoplasm of breast: Secondary | ICD-10-CM

## 2020-10-26 ENCOUNTER — Ambulatory Visit: Payer: BC Managed Care – PPO

## 2020-12-03 ENCOUNTER — Ambulatory Visit
Admission: RE | Admit: 2020-12-03 | Discharge: 2020-12-03 | Disposition: A | Discharge status: Home / Self Care | Payer: 59 | Source: Ambulatory Visit | Attending: Obstetrics and Gynecology | Admitting: Obstetrics and Gynecology

## 2020-12-03 ENCOUNTER — Other Ambulatory Visit: Payer: Self-pay

## 2020-12-03 DIAGNOSIS — Z1231 Encounter for screening mammogram for malignant neoplasm of breast: Secondary | ICD-10-CM

## 2020-12-29 ENCOUNTER — Encounter (HOSPITAL_COMMUNITY): Payer: Self-pay | Admitting: Emergency Medicine

## 2020-12-29 ENCOUNTER — Emergency Department (HOSPITAL_COMMUNITY)
Admission: EM | Admit: 2020-12-29 | Discharge: 2020-12-30 | Disposition: A | Discharge status: Home / Self Care | Payer: 59 | Attending: Emergency Medicine | Admitting: Emergency Medicine

## 2020-12-29 DIAGNOSIS — E1122 Type 2 diabetes mellitus with diabetic chronic kidney disease: Secondary | ICD-10-CM | POA: Diagnosis not present

## 2020-12-29 DIAGNOSIS — R197 Diarrhea, unspecified: Secondary | ICD-10-CM | POA: Insufficient documentation

## 2020-12-29 DIAGNOSIS — Z7982 Long term (current) use of aspirin: Secondary | ICD-10-CM | POA: Insufficient documentation

## 2020-12-29 DIAGNOSIS — R112 Nausea with vomiting, unspecified: Secondary | ICD-10-CM | POA: Diagnosis present

## 2020-12-29 DIAGNOSIS — F17210 Nicotine dependence, cigarettes, uncomplicated: Secondary | ICD-10-CM | POA: Insufficient documentation

## 2020-12-29 DIAGNOSIS — Z7984 Long term (current) use of oral hypoglycemic drugs: Secondary | ICD-10-CM | POA: Insufficient documentation

## 2020-12-29 DIAGNOSIS — J45909 Unspecified asthma, uncomplicated: Secondary | ICD-10-CM | POA: Insufficient documentation

## 2020-12-29 DIAGNOSIS — N1831 Chronic kidney disease, stage 3a: Secondary | ICD-10-CM | POA: Insufficient documentation

## 2020-12-29 DIAGNOSIS — I129 Hypertensive chronic kidney disease with stage 1 through stage 4 chronic kidney disease, or unspecified chronic kidney disease: Secondary | ICD-10-CM | POA: Diagnosis not present

## 2020-12-29 DIAGNOSIS — Z79899 Other long term (current) drug therapy: Secondary | ICD-10-CM | POA: Insufficient documentation

## 2020-12-29 LAB — CBG MONITORING, ED
Glucose-Capillary: 147 mg/dL — ABNORMAL HIGH (ref 70–99)
Glucose-Capillary: 152 mg/dL — ABNORMAL HIGH (ref 70–99)

## 2020-12-29 LAB — COMPREHENSIVE METABOLIC PANEL
ALT: 20 U/L (ref 0–44)
AST: 17 U/L (ref 15–41)
Albumin: 4.2 g/dL (ref 3.5–5.0)
Alkaline Phosphatase: 99 U/L (ref 38–126)
Anion gap: 12 (ref 5–15)
BUN: 9 mg/dL (ref 6–20)
CO2: 24 mmol/L (ref 22–32)
Calcium: 9.1 mg/dL (ref 8.9–10.3)
Chloride: 104 mmol/L (ref 98–111)
Creatinine, Ser: 0.98 mg/dL (ref 0.44–1.00)
GFR, Estimated: >60 mL/min (ref >60)
Glucose, Bld: 170 mg/dL — ABNORMAL HIGH (ref 70–99)
Potassium: 3.2 mmol/L — ABNORMAL LOW (ref 3.5–5.1)
Sodium: 140 mmol/L (ref 135–145)
Total Bilirubin: 0.3 mg/dL (ref 0.3–1.2)
Total Protein: 8.4 g/dL — ABNORMAL HIGH (ref 6.5–8.1)

## 2020-12-29 LAB — CBC
HCT: 38 % (ref 36.0–46.0)
Hemoglobin: 12.4 g/dL (ref 12.0–15.0)
MCH: 30.4 pg (ref 26.0–34.0)
MCHC: 32.6 g/dL (ref 30.0–36.0)
MCV: 93.1 fL (ref 80.0–100.0)
Platelets: 514 10*3/uL — ABNORMAL HIGH (ref 150–400)
RBC: 4.08 MIL/uL (ref 3.87–5.11)
RDW: 13.5 % (ref 11.5–15.5)
WBC: 10.6 10*3/uL — ABNORMAL HIGH (ref 4.0–10.5)
nRBC: 0 % (ref 0.0–0.2)

## 2020-12-29 LAB — LIPASE, BLOOD: Lipase: 31 U/L (ref 11–51)

## 2020-12-29 MED ORDER — LORAZEPAM 1 MG PO TABS
1.0000 mg | ORAL_TABLET | Freq: Once | ORAL | Status: AC
  Administered 2020-12-30: 1 mg via ORAL
  Filled 2020-12-29: qty 1

## 2020-12-29 MED ORDER — POTASSIUM CHLORIDE CRYS ER 20 MEQ PO TBCR
40.0000 meq | EXTENDED_RELEASE_TABLET | Freq: Once | ORAL | Status: AC
  Administered 2020-12-30: 40 meq via ORAL
  Filled 2020-12-29: qty 2

## 2020-12-29 NOTE — ED Provider Notes (Signed)
[facility] DEPT Provider Note   CSN: [id number] Arrival date & time: 12/29/20  [id number]     History Chief Complaint  Patient presents with  . Emesis    Maria [name] is a 60 y.o. female.  60 year old female who presents to the emergency department today secondary to multiple symptoms.  Seems like the most pertinent is that she has been vomiting.  She says the vomiting diarrhea for last couple days.  No sick contacts.  She states she does not go around anyone.  She also had a surgery on her back couple weeks ago and thinks it might be related.  She states that she is started stuttering the last day or 2.  She states that she thinks it is because she is cold.  (Of note the stutter comes and goes depending on the conversation sometimes is more severe and sometimes it is not present at all.)  No other neurologic symptoms.  She has not measured her temperature at home.  She has not tried thing for the symptoms.   Emesis      Past Medical History:  Diagnosis Date  . Anxiety   . Asthma   . Chronic kidney disease (CKD), stage III (moderate) (HCC)   . Chronic lower back pain   . Daily headache   . DKA (diabetic ketoacidoses) admission 03/03/2016  . Fibroid   . Fibromyalgia   . GERD (gastroesophageal reflux disease)   . HTN (hypertension)   . Pelvic pain in female   . Pneumonia 05/2013  . PONV (postoperative nausea and vomiting)   . Renal insufficiency     Patient Active Problem List   Diagnosis Date Noted  . Mild persistent asthma without complication [id number]  . Hyperglycemia 03/03/2016  . CKD (chronic kidney disease), stage III ([place]) 03/03/2016  . CAP (community acquired pneumonia) 06/03/2013  . GERD (gastroesophageal reflux disease) 06/03/2013  . HTN (hypertension) 06/03/2013  . Fibromyalgia syndrome 06/03/2013  . Fibroids 08/07/2012  . Renal insufficiency 08/07/2012  . Herpes 08/07/2012    Past Surgical History:  Procedure  Laterality Date  . BREAST BIOPSY Right   . BREAST EXCISIONAL BIOPSY Right   . DILATION AND CURETTAGE OF UTERUS  2008  . RADIOACTIVE SEED GUIDED EXCISIONAL BREAST BIOPSY Right 11/13/2018   Procedure: RIGHT BREAST RADIOACTIVE SEED GUIDED EXCISIONAL BIOPSY;  Surgeon: Rolm Bookbinder, MD;  Location: [facility];  Service: General;  Laterality: Right;     OB History    Gravida  1   Para  0   Term      Preterm      AB      Living  0     SAB      IAB      Ectopic      Multiple      Live Births              Family History  Problem Relation Age of Onset  . Cancer Mother   . Breast cancer Mother   . Cancer Brother   . Hypertension Brother   . Breast cancer Maternal Grandmother     Social History   Tobacco Use  . Smoking status: Current Some Day Smoker    Packs/day: 0.25    Years: 26.00    Pack years: 6.50    Types: Cigarettes  . Smokeless tobacco: Never Used  Substance Use Topics  . Alcohol use: No  . Drug use: No    Home  Medications Prior to Admission medications   Medication Sig Start Date End Date Taking? Authorizing Provider  promethazine (PHENERGAN) 25 MG suppository Place 1 suppository (25 mg total) rectally every 6 (six) hours as needed for nausea or vomiting. 12/30/20  Yes Chee Kinslow, Barbara Cower, MD  promethazine (PHENERGAN) 25 MG tablet Take 1 tablet (25 mg total) by mouth every 6 (six) hours as needed for nausea or vomiting. 12/30/20  Yes Mistee Soliman, Barbara Cower, MD  ALPRAZolam Prudy Feeler) 1 MG tablet Take 1 mg by mouth at bedtime as needed for sleep or anxiety.    [provider]  ARNUITY ELLIPTA 100 MCG/ACT AEPB Inhale 1 puff into the lungs daily. 12/26/15   [provider]  Aspirin-Salicylamide-Caffeine (BC HEADACHE POWDER PO) Take by mouth.    [provider]  atenolol (TENORMIN) 25 MG tablet Take 25 mg by mouth daily.    [provider]  Azelastine HCl 0.15 % SOLN SPRAY TWICE INTO EACH NOSTRIL TWICE A DAY 07/05/16    [provider]  Blood Glucose Monitoring Suppl (TRUE METRIX METER) DEVI 1 each by Does not apply route 4 (four) times daily. Generic OK if available. Other brands OK based on price. 03/06/16   Leatha Gilding, MD  carisoprodol (SOMA) 250 MG tablet Take 350 mg by mouth at bedtime.     [provider]  diltiazem (CARDIZEM CD) 360 MG 24 hr capsule Take 360 mg by mouth daily. 12/26/15   [provider]  esomeprazole (NEXIUM) 20 MG capsule Take 20 mg by mouth daily at 12 noon.    [provider]  fish oil-omega-3 fatty acids 1000 MG capsule Take 2 g by mouth daily.    [provider]  fluticasone (FLONASE) 50 MCG/ACT nasal spray Place 2 sprays into the nose daily.    [provider]  gabapentin (NEURONTIN) 100 MG capsule Take 100 mg by mouth every evening. Reported on 03/03/2016    [provider]  glucose blood (TRUE METRIX BLOOD GLUCOSE TEST) test strip 4 times daily. Generic OK if available. Other brands OK based on price. 03/06/16   Leatha Gilding, MD  HYDROcodone-acetaminophen (NORCO/VICODIN) 5-325 MG tablet Take 1 tablet by mouth every 6 (six) hours as needed for moderate pain.    [provider]  hydrocortisone 2.5 % cream APPLY TO RASH AFTER BATHING 09/18/16   [provider]  levocetirizine (XYZAL) 5 MG tablet Take 5 mg by mouth every evening.    [provider]  montelukast (SINGULAIR) 10 MG tablet Take 10 mg by mouth at bedtime.    [provider]  Multiple Vitamin (MULTIVITAMIN WITH MINERALS) TABS Take 1 tablet by mouth daily.    [provider]  rosuvastatin (CRESTOR) 5 MG tablet Take 5 mg by mouth daily.    [provider]  sitaGLIPtin-metformin (JANUMET) 50-500 MG tablet Take 1 tablet by mouth 2 (two) times daily with a meal.     [provider]  traMADol (ULTRAM) 50 MG tablet Take 50 mg by mouth every 6 (six) hours as needed for moderate pain.     [provider]  traMADol (ULTRAM) 50 MG tablet Take 1 tablet (50 mg total) by mouth every 6 (six) hours as needed. 11/13/18   Emelia Loron, MD  triamterene-hydrochlorothiazide (DYAZIDE) 37.5-25 MG capsule Take 1 capsule by mouth daily.    [provider]  TRUEPLUS LANCETS 28G MISC 1 each by Does not apply route 4 (four) times daily. Generic OK if available. Other brands  OK based on price. 03/06/16   Caren Griffins, MD  Turmeric 500 MG CAPS Take by mouth.    [provider]  valACYclovir (VALTREX) 500 MG tablet TAKE 1 TABLET BY MOUTH TWICE A DAY FOR 3 DAYS 07/17/16   [provider]  vitamin B-12 (CYANOCOBALAMIN) 1000 MCG tablet Take 2,000 mcg by mouth daily.    [provider]    Allergies    Advair diskus [fluticasone-salmeterol] and Other  Review of Systems   Review of Systems  Gastrointestinal: Positive for vomiting.  All other systems reviewed and are negative.   Physical Exam Updated Vital Signs BP 113/65 (BP Location: Left Arm)   Pulse 85   Temp 98.6 F (37 C) (Oral)   Resp 17   LMP 05/21/2013   SpO2 98%   Physical Exam Vitals and nursing note reviewed.  Constitutional:      Appearance: She is well-developed and well-nourished.  HENT:     Head: Normocephalic and atraumatic.     Nose: No congestion or rhinorrhea.     Mouth/Throat:     Mouth: Mucous membranes are moist.     Pharynx: Oropharynx is clear.  Eyes:     Pupils: Pupils are equal, round, and reactive to light.  Cardiovascular:     Rate and Rhythm: Normal rate and regular rhythm.  Pulmonary:     Effort: No respiratory distress.     Breath sounds: No stridor.  Abdominal:     General: There is no distension.  Musculoskeletal:        General: No swelling or tenderness. Normal range of motion.     Cervical back: Normal range of motion.  Skin:    General: Skin is warm and dry.     Coloration: Skin is not jaundiced or pale.     Comments: Wound on back is clean dry  and intact without evidence of infection.  Neurological:     General: No focal deficit present.     Mental Status: She is alert.     Comments: No altered mental status, able to give full seemingly accurate history.  Face is symmetric, EOM's intact, pupils equal and reactive, vision intact, tongue and uvula midline without deviation. Waxing and waning stutter noticed.  Upper and Lower extremity motor 5/5, intact pain perception in distal extremities, 2+ reflexes in biceps, patella and achilles tendons. Able to perform finger to nose normal with both hands. Walks without assistance or evident ataxia.       ED Results / Procedures / Treatments   Labs (all labs ordered are listed, but only abnormal results are displayed) Labs Reviewed  COMPREHENSIVE METABOLIC PANEL - Abnormal; Notable for the following components:      Result Value   Potassium 3.2 (*)    Glucose, Bld 170 (*)    Total Protein 8.4 (*)    All other components within normal limits  CBC - Abnormal; Notable for the following components:   WBC 10.6 (*)    Platelets 514 (*)    All other components within normal limits  URINALYSIS, ROUTINE W REFLEX MICROSCOPIC - Abnormal; Notable for the following components:   APPearance HAZY (*)    Ketones, ur 5 (*)    Protein, ur 30 (*)    Bacteria, UA FEW (*)    All other components within normal limits  CBG MONITORING, ED - Abnormal; Notable for the following components:   Glucose-Capillary 147 (*)    All other components within normal limits  CBG MONITORING, ED - Abnormal; Notable for the following components:   Glucose-Capillary 152 (*)    All other components within normal limits  LIPASE, BLOOD    EKG None  Radiology No results found.  Procedures Procedures (including critical care time)  Medications Ordered in ED Medications  LORazepam (ATIVAN) tablet 1 mg (1 mg Oral Given 12/30/20 0005)  potassium chloride SA (KLOR-CON) CR tablet 40 mEq (40 mEq Oral Given 12/30/20 0004)   alum & mag hydroxide-simeth (MAALOX/MYLANTA) 200-200-20 MG/5ML suspension 15 mL (15 mLs Oral Given 12/30/20 0215)    ED Course  I have reviewed the triage vital signs and the nursing notes.  Pertinent labs & imaging results that were available during my care of the patient were reviewed by me and considered in my medical decision making (see chart for details).    MDM Rules/Calculators/A&P                         Will treat symptoms with ativan. Reassess.  Patient reevaluated multiple times without any stuttering.  When asked about it the starting would start back and she was stating getting better.  Patient drank a whole bottle of water while she was here.  But stated that she could not tolerate the applesauce because it caused heartburn.  MiraLAX was given but the patient wanted something else.  At this time about the patient had a medical screening exam has been completed.  Low suspicion for any neurologic causes for her symptoms.  Her vomiting seem to have improved.  Patient was discharged in stable condition with PCP follow-up.  Final Clinical Impression(s) / ED Diagnoses Final diagnoses:  Non-intractable vomiting with nausea, unspecified vomiting type    Rx / DC Orders ED Discharge Orders         Ordered    promethazine (PHENERGAN) 25 MG suppository  Every 6 hours PRN        12/30/20 0407    promethazine (PHENERGAN) 25 MG tablet  Every 6 hours PRN        12/30/20 0407           Tammee Thielke, Corene Cornea, MD 12/30/20 [phone]

## 2020-12-29 NOTE — ED Notes (Signed)
Pt ambulated into restroom and into stall wo any assistance. Pt has a steady gait

## 2020-12-29 NOTE — ED Triage Notes (Signed)
Per PT[facility]-states she had back surgery 2 weeks ago-states she has been feeling sick for 1 week-only able to take her pain meds-states she developed a stutter on Sunday-hs not followed up with her surgeon-surgery was in [facility]

## 2020-12-30 LAB — URINALYSIS, ROUTINE W REFLEX MICROSCOPIC
Bilirubin Urine: NEGATIVE
Glucose, UA: NEGATIVE mg/dL
Hgb urine dipstick: NEGATIVE
Ketones, ur: 5 mg/dL — AB
Leukocytes,Ua: NEGATIVE
Nitrite: NEGATIVE
Protein, ur: 30 mg/dL — AB
Specific Gravity, Urine: 1.015 (ref 1.005–1.030)
pH: 6 (ref 5.0–8.0)

## 2020-12-30 MED ORDER — PROMETHAZINE HCL 25 MG RE SUPP: 25.0000 mg | Freq: Four times a day (QID) | RECTAL | 0 refills | Status: DC | PRN

## 2020-12-30 MED ORDER — ALUM & MAG HYDROXIDE-SIMETH 200-200-20 MG/5ML PO SUSP
15.0000 mL | Freq: Once | ORAL | Status: AC
  Administered 2020-12-30: 15 mL via ORAL
  Filled 2020-12-30: qty 30

## 2020-12-30 MED ORDER — PROMETHAZINE HCL 25 MG PO TABS: 25.0000 mg | ORAL_TABLET | Freq: Four times a day (QID) | ORAL | 0 refills | Status: DC | PRN

## 2021-04-19 ENCOUNTER — Ambulatory Visit
Admission: RE | Admit: 2021-04-19 | Discharge: 2021-04-19 | Disposition: A | Discharge status: Home / Self Care | Payer: 59 | Source: Ambulatory Visit | Attending: Internal Medicine | Admitting: Internal Medicine

## 2021-04-19 ENCOUNTER — Other Ambulatory Visit: Payer: Self-pay | Admitting: Internal Medicine

## 2021-04-19 DIAGNOSIS — R140 Abdominal distension (gaseous): Secondary | ICD-10-CM

## 2022-02-18 ENCOUNTER — Other Ambulatory Visit: Payer: Self-pay | Admitting: Obstetrics and Gynecology

## 2022-02-18 DIAGNOSIS — Z1231 Encounter for screening mammogram for malignant neoplasm of breast: Secondary | ICD-10-CM

## 2022-03-04 ENCOUNTER — Ambulatory Visit
Admission: RE | Admit: 2022-03-04 | Discharge: 2022-03-04 | Disposition: A | Discharge status: Home / Self Care | Payer: 59 | Source: Ambulatory Visit | Attending: Obstetrics and Gynecology | Admitting: Obstetrics and Gynecology

## 2022-03-04 DIAGNOSIS — Z1231 Encounter for screening mammogram for malignant neoplasm of breast: Secondary | ICD-10-CM

## 2022-06-03 ENCOUNTER — Ambulatory Visit: Payer: 59 | Admitting: Podiatry

## 2022-06-03 DIAGNOSIS — G5751 Tarsal tunnel syndrome, right lower limb: Secondary | ICD-10-CM

## 2022-06-03 MED ORDER — LIDOCAINE 5 % EX OINT: 1.0000 "application " | TOPICAL_OINTMENT | CUTANEOUS | 0 refills | Status: DC | PRN

## 2022-06-07 NOTE — Progress Notes (Signed)
Subjective:  Patient ID: Maria [name], female    DOB: 1961/12/13,  MRN: [id number]  Chief Complaint  Patient presents with   Diabetes    61 y.o. female presents with the above complaint.  Patient presents with complaint of right nerve pain/burning sensation pain to the plantar foot.  Patient states that she is a diabetic with last A1c of 13.4%.  She states that this has been causing her a lot of problems.  However she has not had a new A1c recently.  She states it hurts with ambulation and has progressive gotten worse.  She has not tried anything for it.  She is a stage III CKD patient.  She wanted to discuss this further.   Review of Systems: Negative except as noted in the HPI. Denies N/V/F/Ch.  Past Medical History:  Diagnosis Date   Anxiety    Asthma    Chronic kidney disease (CKD), stage III (moderate) (HCC)    Chronic lower back pain    Daily headache    DKA (diabetic ketoacidoses) admission 03/03/2016   Fibroid    Fibromyalgia    GERD (gastroesophageal reflux disease)    HTN (hypertension)    Pelvic pain in female    Pneumonia 05/2013   PONV (postoperative nausea and vomiting)    Renal insufficiency     Current Outpatient Medications:    ALPRAZolam (XANAX) 1 MG tablet, 1 tablet, Disp: , Rfl:    benzonatate (TESSALON) 100 MG capsule, benzonatate 100 mg capsule  TAKE 1-2 CAPSULES 3 TIMES A DAY AS NEEDED FOR COUGH, Disp: , Rfl:    Dulaglutide (TRULICITY) 1.5 LP/3.7TK SOPN, Trulicity 1.5 WI/0.9 mL subcutaneous pen injector  ADMINISTER 1.5 MG UNDER THE SKIN 1 TIME A WEEK, Disp: , Rfl:    lidocaine (XYLOCAINE) 5 % ointment, Apply 1 application  topically as needed., Disp: 35.44 g, Rfl: 0   albuterol (VENTOLIN HFA) 108 (90 Base) MCG/ACT inhaler, albuterol sulfate HFA 90 mcg/actuation aerosol inhaler  INHALE 1 TO 2 PUFFS INTO THE LUNGS EVERY 4 TO 6 HOURS FOR COUGH/WHEEZE, Disp: , Rfl:    albuterol (VENTOLIN HFA) 108 (90 Base) MCG/ACT inhaler, SMARTSIG:1-2 Puff(s) Via  Inhaler Every 4-6 Hours, Disp: , Rfl:    ALPRAZolam (XANAX) 1 MG tablet, Take 1 mg by mouth at bedtime as needed for sleep or anxiety., Disp: , Rfl:    amoxicillin-clavulanate (AUGMENTIN) 875-125 MG tablet, amoxicillin 875 mg-potassium clavulanate 125 mg tablet, Disp: , Rfl:    ARIPiprazole (ABILIFY) 5 MG tablet, aripiprazole 5 mg tablet  TAKE 1 TABLET BY MOUTH IN THE MORNING ONCE DAILY, Disp: , Rfl:    ARNUITY ELLIPTA 100 MCG/ACT AEPB, Inhale 1 puff into the lungs daily., Disp: , Rfl: 6   Aspirin-Salicylamide-Caffeine (BC HEADACHE POWDER PO), Take by mouth., Disp: , Rfl:    atenolol (TENORMIN) 25 MG tablet, Take 25 mg by mouth daily., Disp: , Rfl:    azelastine (ASTELIN) 0.1 % nasal spray, Place into the nose., Disp: , Rfl:    Azelastine HCl 0.15 % SOLN, SPRAY TWICE INTO EACH NOSTRIL TWICE A DAY, Disp: , Rfl: 3   Azelastine HCl 0.15 % SOLN, azelastine 0.15 % (205.5 mcg) nasal spray, Disp: , Rfl:    azithromycin (ZITHROMAX) 250 MG tablet, azithromycin 250 mg tablet, Disp: , Rfl:    Blood Glucose Monitoring Suppl (TRUE METRIX METER) DEVI, 1 each by Does not apply route 4 (four) times daily. Generic OK if available. Other brands OK based on price., Disp: 1 Device, Rfl:  0   calcium elemental as carbonate (BARIATRIC TUMS ULTRA) 400 MG chewable tablet, Chew 2 tablets by mouth at bedtime., Disp: , Rfl:    carisoprodol (SOMA) 250 MG tablet, Take 350 mg by mouth at bedtime. , Disp: , Rfl:    cefpodoxime (VANTIN) 200 MG tablet, Take 200 mg by mouth daily., Disp: , Rfl:    cyclobenzaprine (FLEXERIL) 10 MG tablet, cyclobenzaprine 10 mg tablet, Disp: , Rfl:    dexlansoprazole (DEXILANT) 60 MG capsule, Dexilant 60 mg capsule, delayed release  TAKE ONE CAPSULE BY MOUTH EVERY DAY, Disp: , Rfl:    diclofenac (VOLTAREN) 75 MG EC tablet, diclofenac sodium 75 mg tablet,delayed release  TAKE 1 TABLET BY MOUTH TWICE A DAY AS NEEDED FOR PAIN, Disp: , Rfl:    diltiazem (CARDIZEM CD) 360 MG 24 hr capsule, Take 360 mg by  mouth daily., Disp: , Rfl: 2   diltiazem (CARDIZEM LA) 360 MG 24 hr tablet, 1 tablet, Disp: , Rfl:    diltiazem (TIAZAC) 300 MG 24 hr capsule, diltiazem CD 300 mg capsule,extended release 24 hr  TAKE ONE CAPSULE BY MOUTH EVERY DAY, Disp: , Rfl:    esomeprazole (NEXIUM) 20 MG capsule, Take 20 mg by mouth daily at 12 noon., Disp: , Rfl:    estazolam (PROSOM) 2 MG tablet, estazolam 2 mg tablet  TAKE 1 TABLET BY MOUTH EVERYDAY AT BEDTIME, Disp: , Rfl:    etodolac (LODINE) 400 MG tablet, etodolac 400 mg tablet, Disp: , Rfl:    famotidine (PEPCID) 40 MG tablet, 1 tablet at bedtime., Disp: , Rfl:    fentaNYL (DURAGESIC) 12 MCG/HR, fentanyl 12 mcg/hr transdermal patch, Disp: , Rfl:    fish oil-omega-3 fatty acids 1000 MG capsule, Take 2 g by mouth daily., Disp: , Rfl:    fluconazole (DIFLUCAN) 100 MG tablet, fluconazole 100 mg tablet, Disp: , Rfl:    fluocinonide cream (LIDEX) 0.05 %, fluocinonide 0.05 % topical cream  APPLY TO ARMS AFTER BATHING FOR 4 WEEKS, Disp: , Rfl:    fluticasone (FLONASE) 50 MCG/ACT nasal spray, Place 2 sprays into the nose daily., Disp: , Rfl:    fluticasone (FLONASE) 50 MCG/ACT nasal spray, Place into the nose., Disp: , Rfl:    Fluticasone Furoate 100 MCG/ACT AEPB, Inhale into the lungs., Disp: , Rfl:    gabapentin (NEURONTIN) 100 MG capsule, Take 100 mg by mouth every evening. Reported on 03/03/2016, Disp: , Rfl:    gabapentin (NEURONTIN) 100 MG capsule, gabapentin 100 mg capsule, Disp: , Rfl:    gabapentin (NEURONTIN) 300 MG capsule, gabapentin 300 mg capsule  TAKE 1 CAPSULE BY MOUTH 4 TIMES A DAY, Disp: , Rfl:    glipiZIDE (GLUCOTROL) 5 MG tablet, glipizide 5 mg tablet  TAKE 1 TABLET BY MOUTH TWICE DAILY, Disp: , Rfl:    glucose blood (TRUE METRIX BLOOD GLUCOSE TEST) test strip, 4 times daily. Generic OK if available. Other brands OK based on price., Disp: 100 each, Rfl: 12   HYDROcodone-acetaminophen (NORCO/VICODIN) 5-325 MG tablet, Take 1 tablet by mouth every 6 (six) hours  as needed for moderate pain., Disp: , Rfl:    hydrocortisone 2.5 % cream, APPLY TO RASH AFTER BATHING, Disp: , Rfl: 3   hydrOXYzine (ATARAX) 25 MG tablet, hydroxyzine HCl 25 mg tablet  TAKE 1 TABLET BY MOUTH AT NIGHT AS NEEDED FOR SLEEP, Disp: , Rfl:    levocetirizine (XYZAL) 5 MG tablet, Take 5 mg by mouth every evening., Disp: , Rfl:    montelukast (SINGULAIR)  10 MG tablet, Take 10 mg by mouth at bedtime., Disp: , Rfl:    Multiple Vitamin (MULTIVITAMIN WITH MINERALS) TABS, Take 1 tablet by mouth daily., Disp: , Rfl:    promethazine (PHENERGAN) 25 MG suppository, Place 1 suppository (25 mg total) rectally every 6 (six) hours as needed for nausea or vomiting., Disp: 12 each, Rfl: 0   promethazine (PHENERGAN) 25 MG tablet, Take 1 tablet (25 mg total) by mouth every 6 (six) hours as needed for nausea or vomiting., Disp: 30 tablet, Rfl: 0   rosuvastatin (CRESTOR) 5 MG tablet, Take 5 mg by mouth daily., Disp: , Rfl:    sitaGLIPtin-metformin (JANUMET) 50-500 MG tablet, Take 1 tablet by mouth 2 (two) times daily with a meal. , Disp: , Rfl:    traMADol (ULTRAM) 50 MG tablet, Take 50 mg by mouth every 6 (six) hours as needed for moderate pain. , Disp: , Rfl:    traMADol (ULTRAM) 50 MG tablet, Take 1 tablet (50 mg total) by mouth every 6 (six) hours as needed., Disp: 10 tablet, Rfl: 0   triamterene-hydrochlorothiazide (DYAZIDE) 37.5-25 MG capsule, Take 1 capsule by mouth daily., Disp: , Rfl:    TRUEPLUS LANCETS 28G MISC, 1 each by Does not apply route 4 (four) times daily. Generic OK if available. Other brands OK based on price., Disp: 210 each, Rfl: 2   Turmeric 500 MG CAPS, Take by mouth., Disp: , Rfl:    valACYclovir (VALTREX) 500 MG tablet, TAKE 1 TABLET BY MOUTH TWICE A DAY FOR 3 DAYS, Disp: , Rfl: 2   vitamin B-12 (CYANOCOBALAMIN) 1000 MCG tablet, Take 2,000 mcg by mouth daily., Disp: , Rfl:   Social History   Tobacco Use  Smoking Status Some Days   Packs/day: 0.25   Years: 26.00   Total pack  years: 6.50   Types: Cigarettes  Smokeless Tobacco Never    Allergies  Allergen Reactions   Leflunomide     Other reaction(s): swelling   Metformin Diarrhea   Advair Diskus [Fluticasone-Salmeterol] Palpitations   Methocarbamol Palpitations   Other Palpitations    "Muscle relaxer that starts with an M" methocarbamol?   Objective:  There were no vitals filed for this visit. There is no height or weight on file to calculate BMI. Constitutional Well developed. Well nourished.  Vascular Dorsalis pedis pulses palpable bilaterally. Posterior tibial pulses palpable bilaterally. Capillary refill normal to all digits.  No cyanosis or clubbing noted. Pedal hair growth normal.  Neurologic Normal speech. Oriented to person, place, and time. Positive tarsal tunnel noted to the right side plantar foot.  Diminished protective sensation noted to both forefoot.  Dermatologic Nails well groomed and normal in appearance. No open wounds. No skin lesions.  Orthopedic: Manual muscle strength 5 out of 5.  No deformities noted.  No signs of ulcerations noted.   Radiographs: None Assessment:   1. Tarsal tunnel syndrome of right side    Plan:  Patient was evaluated and treated and all questions answered.  Right tarsal tunnel syndrome versus polyneuropathy secondary to diabetes -All questions and concerns were discussed with the patient extensive detail -She will benefit from lidocaine jelly for application and decrease the sensation feeling that she is describing.  I have sent it to her pharmacy.  I described her to apply once or twice a day.  She states understanding. -I will also order a nerve conduction study to further evaluate the tarsal tunnel syndrome. -If patient is a candidate for surgery patient will need a  new A1c or more recent A1c.  No follow-ups on file.

## 2022-07-01 IMAGING — MG MM DIGITAL SCREENING BILAT W/ TOMO AND CAD
8 series · 8 of 24 positions shown · non-contrast
Comparison: Previous exam(s).

CLINICAL DATA: Screening.

EXAM:
DIGITAL SCREENING BILATERAL MAMMOGRAM WITH TOMOSYNTHESIS AND CAD
TECHNIQUE: Bilateral screening digital craniocaudal and mediolateral oblique
mammograms were obtained. Bilateral screening digital breast
tomosynthesis was performed. The images were evaluated with
computer-aided detection.

[L MLO synth-2D]
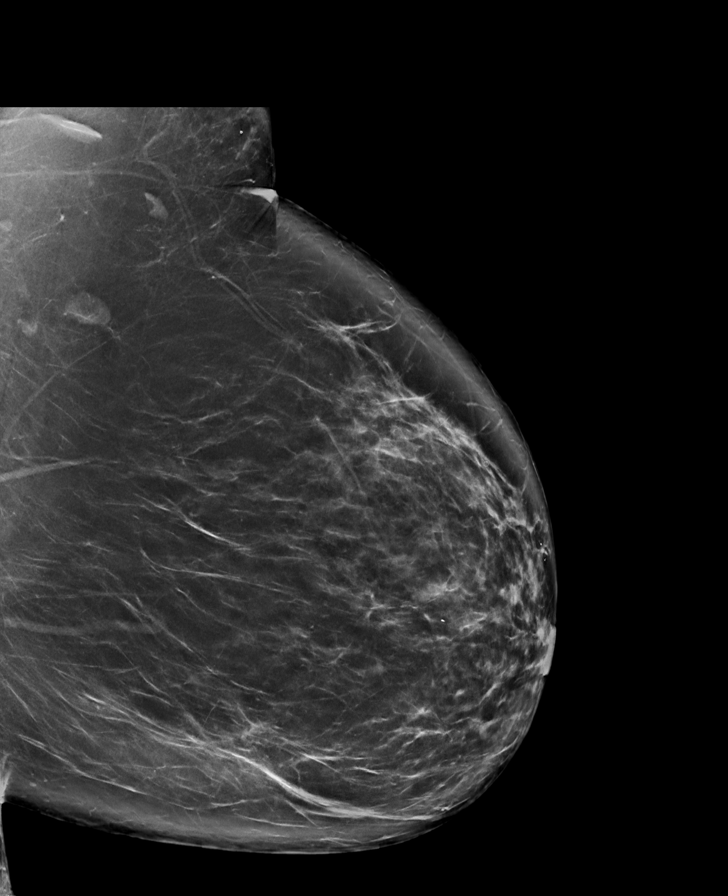

[R CC synth-2D]
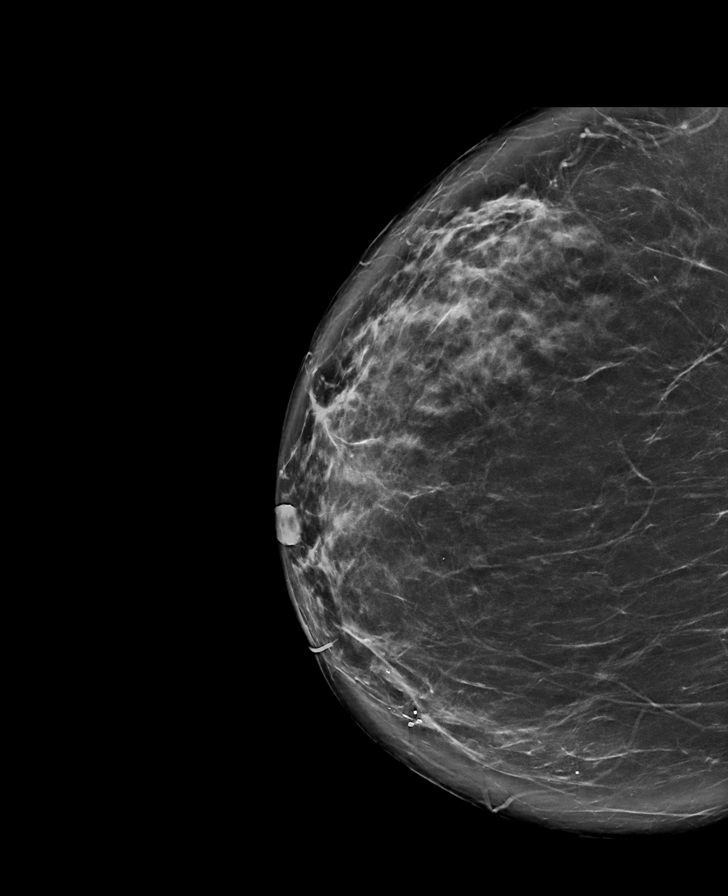

[R MLO synth-2D]
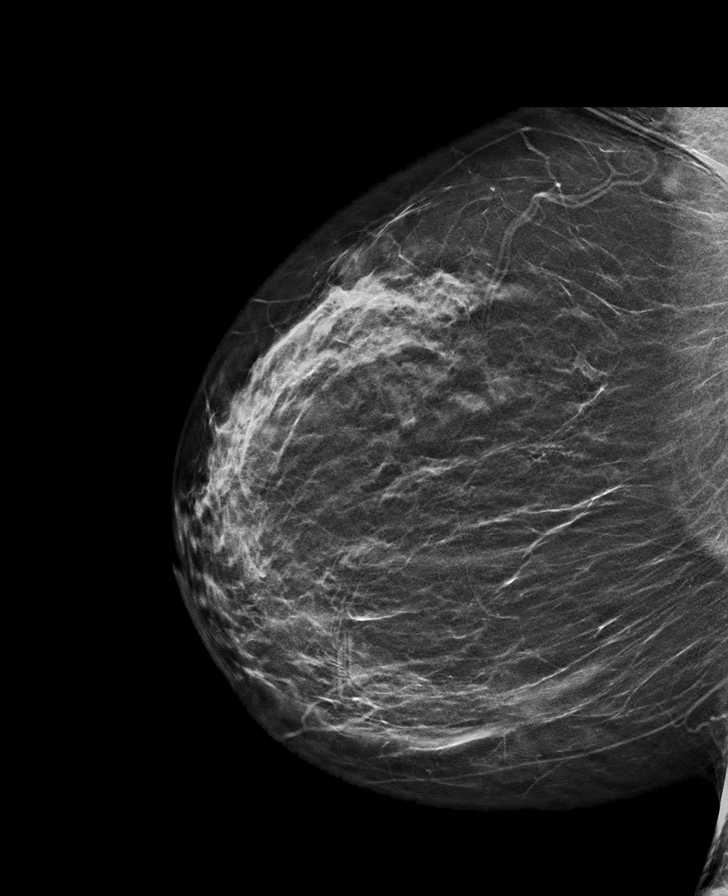

[L CC synth-2D]
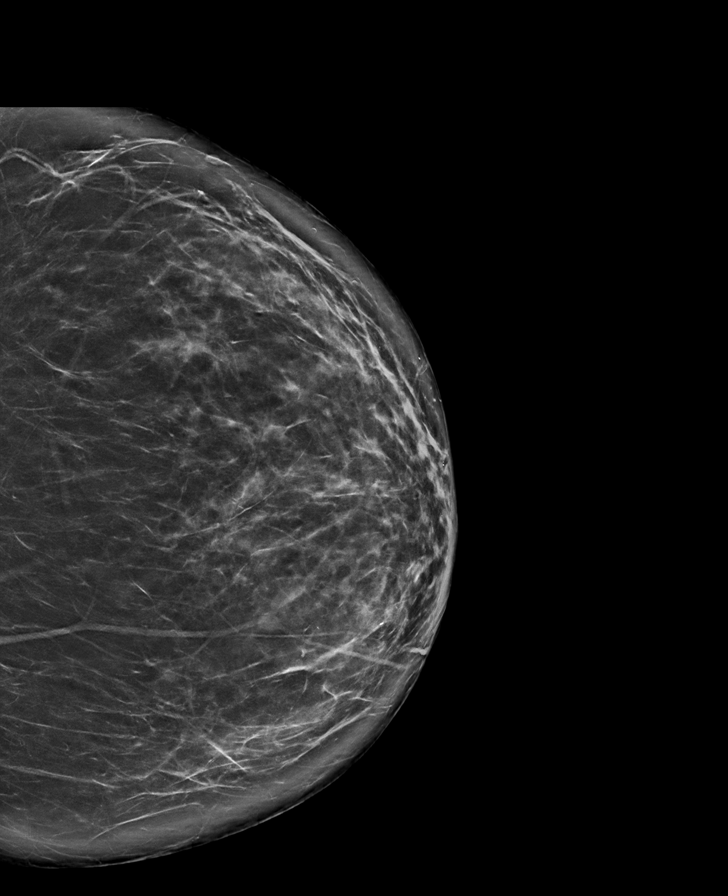

[R MLO tomo · tomo slice 12/23.0]
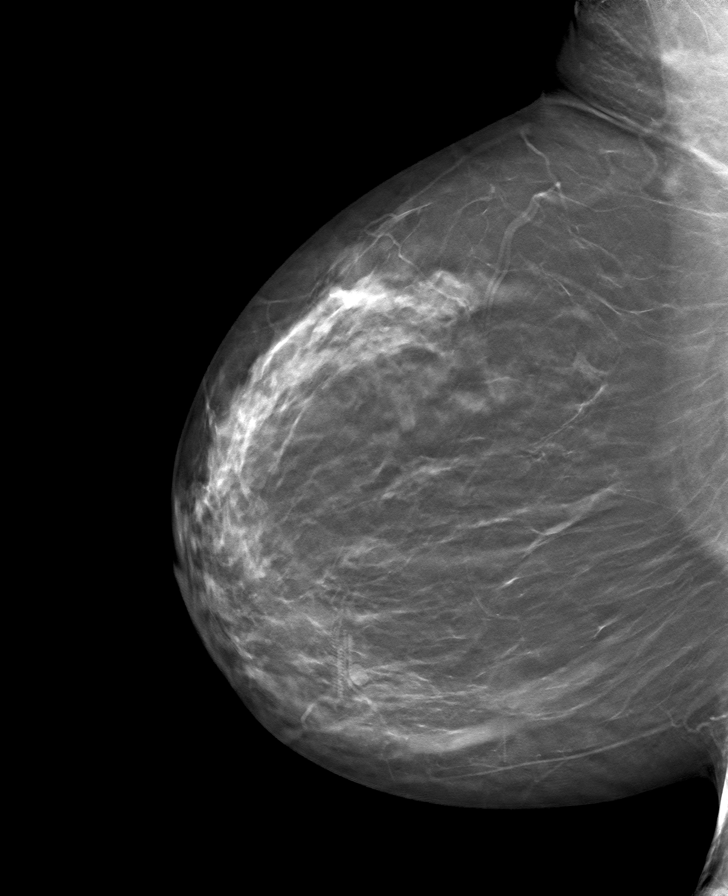

[L CC tomo · tomo slice 45/89.0]
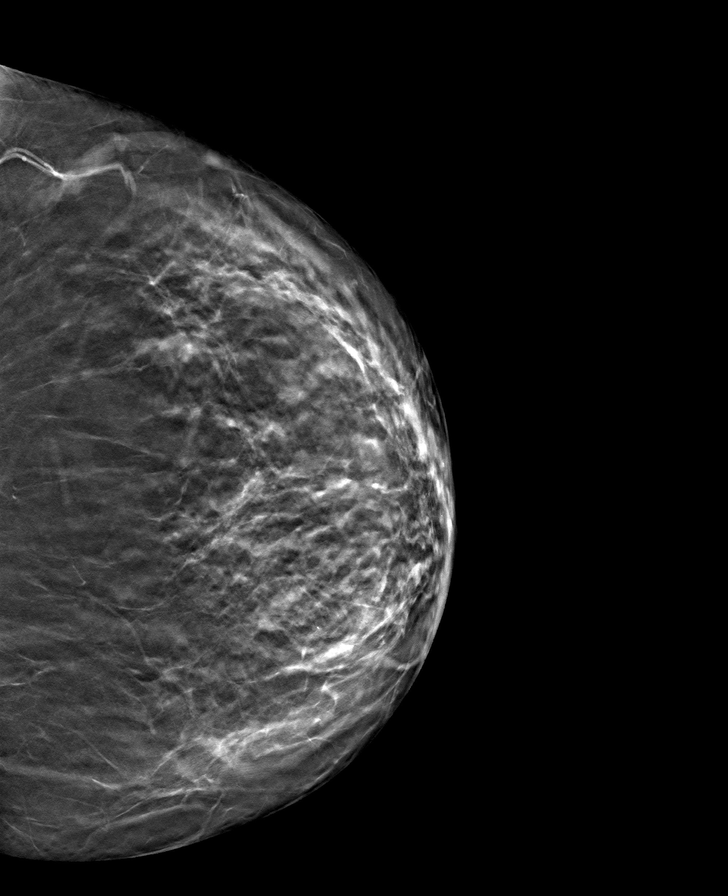

[L MLO tomo · tomo slice 50/99.0]
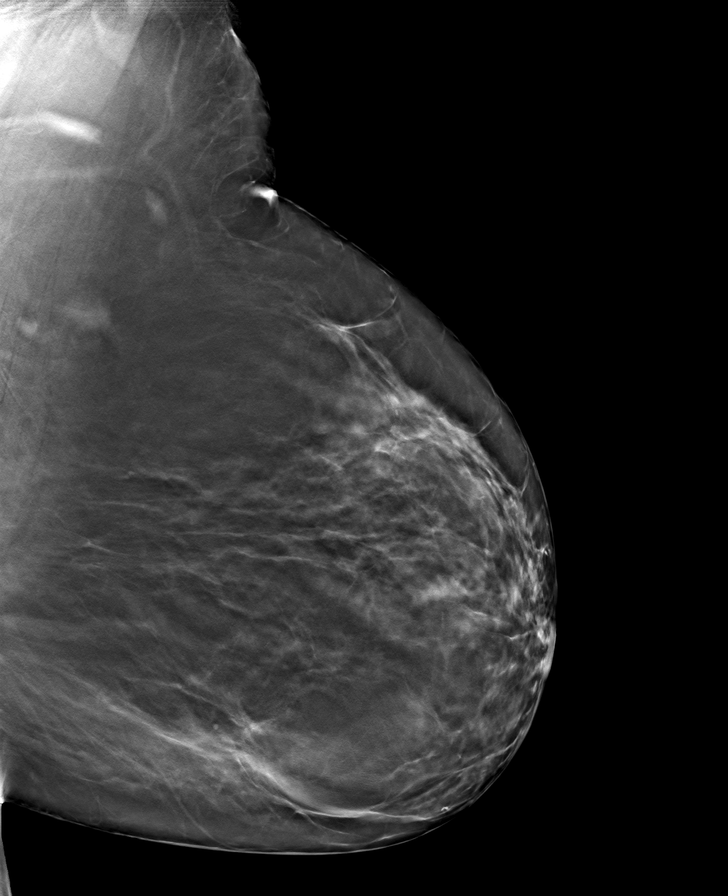

[R CC tomo · tomo slice 47/94.0]
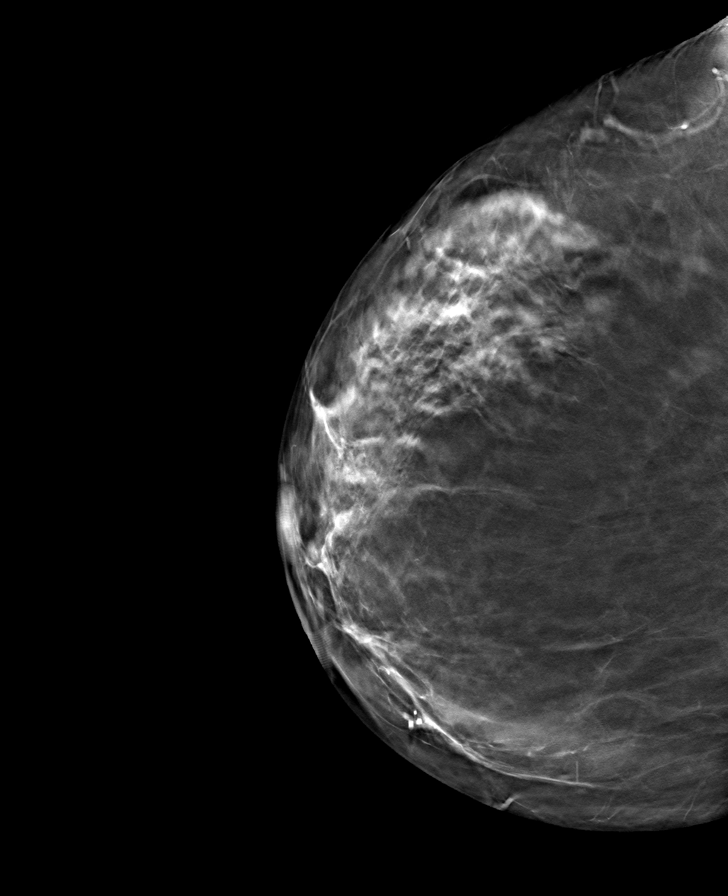

[8 of 24 positions shown; findings below may reference images not displayed]

ACR Breast Density Category b: There are scattered areas of
fibroglandular density.
FINDINGS: There are no findings suspicious for malignancy.
IMPRESSION: No mammographic evidence of malignancy. A result letter of this
screening mammogram will be mailed directly to the patient.

RECOMMENDATION:
Screening mammogram in one year. (Code:51-O-LD2)

BI-RADS CATEGORY  1: Negative.

## 2022-07-12 ENCOUNTER — Telehealth: Payer: Self-pay | Admitting: Podiatry

## 2022-07-12 NOTE — Telephone Encounter (Signed)
Pt called upset that she has gotten 7 calls from out office regarding her appt that was scheduled for Friday 7.21 but she has never received a call to schedule the nerve study that you ordered. I am cxling the appt for 7.21 and will reschedule once we have the nurse study ordered. Please have someone call pt to schedule the study asap.

## 2022-07-15 ENCOUNTER — Ambulatory Visit: Payer: 59 | Admitting: Podiatry

## 2022-07-15 ENCOUNTER — Ambulatory Visit
Admission: RE | Admit: 2022-07-15 | Discharge: 2022-07-15 | Disposition: A | Discharge status: Home / Self Care | Payer: 59 | Source: Ambulatory Visit | Attending: Urgent Care | Admitting: Urgent Care

## 2022-07-15 VITALS — BP 117/78 | HR 82 | Temp 98.4°F | Resp 18

## 2022-07-15 DIAGNOSIS — R233 Spontaneous ecchymoses: Secondary | ICD-10-CM | POA: Diagnosis not present

## 2022-07-15 DIAGNOSIS — R21 Rash and other nonspecific skin eruption: Secondary | ICD-10-CM | POA: Diagnosis not present

## 2022-07-15 DIAGNOSIS — M069 Rheumatoid arthritis, unspecified: Secondary | ICD-10-CM | POA: Diagnosis not present

## 2022-07-15 NOTE — Telephone Encounter (Signed)
NVC w/ EMG referral has been sent to [facility](Dr Jacelyn Grip), confirmation has been received. Patient notified.

## 2022-07-15 NOTE — ED Provider Notes (Addendum)
[place]   MRN: [id number] DOB: [birth year]  Subjective:   Maria [name] is a 61 y.o. female presenting for 1 week history of red spots over her hands that have now developed on her feet as well. She recently started receiving Simponi for her rheumatoid arthritis about 1 to 2 months ago.  No other changes to her medications.  Patient is not sexually active, has no concerns for syphilis or other STIs.  She did get complete blood work done through her rheumatologist, Dr. Kathlene November.  No tick bites.  No oral swelling, chest pain, shortness of breath, wheezing, nausea, vomiting, abdominal pain.  The lesions do not bother her apart from the parents.  No current facility-administered medications for this encounter.  Current Outpatient Medications:    albuterol (VENTOLIN HFA) 108 (90 Base) MCG/ACT inhaler, albuterol sulfate HFA 90 mcg/actuation aerosol inhaler  INHALE 1 TO 2 PUFFS INTO THE LUNGS EVERY 4 TO 6 HOURS FOR COUGH/WHEEZE, Disp: , Rfl:    albuterol (VENTOLIN HFA) 108 (90 Base) MCG/ACT inhaler, SMARTSIG:1-2 Puff(s) Via Inhaler Every 4-6 Hours, Disp: , Rfl:    ALPRAZolam (XANAX) 1 MG tablet, Take 1 mg by mouth at bedtime as needed for sleep or anxiety., Disp: , Rfl:    ALPRAZolam (XANAX) 1 MG tablet, 1 tablet, Disp: , Rfl:    amoxicillin-clavulanate (AUGMENTIN) 875-125 MG tablet, amoxicillin 875 mg-potassium clavulanate 125 mg tablet, Disp: , Rfl:    ARIPiprazole (ABILIFY) 5 MG tablet, aripiprazole 5 mg tablet  TAKE 1 TABLET BY MOUTH IN THE MORNING ONCE DAILY, Disp: , Rfl:    ARNUITY ELLIPTA 100 MCG/ACT AEPB, Inhale 1 puff into the lungs daily., Disp: , Rfl: 6   Aspirin-Salicylamide-Caffeine (BC HEADACHE POWDER PO), Take by mouth., Disp: , Rfl:    atenolol (TENORMIN) 25 MG tablet, Take 25 mg by mouth daily., Disp: , Rfl:    azelastine (ASTELIN) 0.1 % nasal spray, Place into the nose., Disp: , Rfl:    Azelastine HCl 0.15 % SOLN, SPRAY TWICE INTO EACH NOSTRIL  TWICE A DAY, Disp: , Rfl: 3   Azelastine HCl 0.15 % SOLN, azelastine 0.15 % (205.5 mcg) nasal spray, Disp: , Rfl:    azithromycin (ZITHROMAX) 250 MG tablet, azithromycin 250 mg tablet, Disp: , Rfl:    benzonatate (TESSALON) 100 MG capsule, benzonatate 100 mg capsule  TAKE 1-2 CAPSULES 3 TIMES A DAY AS NEEDED FOR COUGH, Disp: , Rfl:    Blood Glucose Monitoring Suppl (TRUE METRIX METER) DEVI, 1 each by Does not apply route 4 (four) times daily. Generic OK if available. Other brands OK based on price., Disp: 1 Device, Rfl: 0   calcium elemental as carbonate (BARIATRIC TUMS ULTRA) 400 MG chewable tablet, Chew 2 tablets by mouth at bedtime., Disp: , Rfl:    carisoprodol (SOMA) 250 MG tablet, Take 350 mg by mouth at bedtime. , Disp: , Rfl:    cefpodoxime (VANTIN) 200 MG tablet, Take 200 mg by mouth daily., Disp: , Rfl:    cyclobenzaprine (FLEXERIL) 10 MG tablet, cyclobenzaprine 10 mg tablet, Disp: , Rfl:    dexlansoprazole (DEXILANT) 60 MG capsule, Dexilant 60 mg capsule, delayed release  TAKE ONE CAPSULE BY MOUTH EVERY DAY, Disp: , Rfl:    diclofenac (VOLTAREN) 75 MG EC tablet, diclofenac sodium 75 mg tablet,delayed release  TAKE 1 TABLET BY MOUTH TWICE A DAY AS NEEDED FOR PAIN, Disp: , Rfl:    diltiazem (CARDIZEM CD) 360 MG 24 hr capsule, Take 360 mg by  mouth daily., Disp: , Rfl: 2   diltiazem (CARDIZEM LA) 360 MG 24 hr tablet, 1 tablet, Disp: , Rfl:    diltiazem (TIAZAC) 300 MG 24 hr capsule, diltiazem CD 300 mg capsule,extended release 24 hr  TAKE ONE CAPSULE BY MOUTH EVERY DAY, Disp: , Rfl:    Dulaglutide (TRULICITY) 1.5 QM/0.8QP SOPN, Trulicity 1.5 YP/9.5 mL subcutaneous pen injector  ADMINISTER 1.5 MG UNDER THE SKIN 1 TIME A WEEK, Disp: , Rfl:    esomeprazole (NEXIUM) 20 MG capsule, Take 20 mg by mouth daily at 12 noon., Disp: , Rfl:    estazolam (PROSOM) 2 MG tablet, estazolam 2 mg tablet  TAKE 1 TABLET BY MOUTH EVERYDAY AT BEDTIME, Disp: , Rfl:    etodolac (LODINE) 400 MG tablet, etodolac 400 mg  tablet, Disp: , Rfl:    famotidine (PEPCID) 40 MG tablet, 1 tablet at bedtime., Disp: , Rfl:    fentaNYL (DURAGESIC) 12 MCG/HR, fentanyl 12 mcg/hr transdermal patch, Disp: , Rfl:    fish oil-omega-3 fatty acids 1000 MG capsule, Take 2 g by mouth daily., Disp: , Rfl:    fluconazole (DIFLUCAN) 100 MG tablet, fluconazole 100 mg tablet, Disp: , Rfl:    fluocinonide cream (LIDEX) 0.05 %, fluocinonide 0.05 % topical cream  APPLY TO ARMS AFTER BATHING FOR 4 WEEKS, Disp: , Rfl:    fluticasone (FLONASE) 50 MCG/ACT nasal spray, Place 2 sprays into the nose daily., Disp: , Rfl:    fluticasone (FLONASE) 50 MCG/ACT nasal spray, Place into the nose., Disp: , Rfl:    Fluticasone Furoate 100 MCG/ACT AEPB, Inhale into the lungs., Disp: , Rfl:    gabapentin (NEURONTIN) 100 MG capsule, Take 100 mg by mouth every evening. Reported on 03/03/2016, Disp: , Rfl:    gabapentin (NEURONTIN) 100 MG capsule, gabapentin 100 mg capsule, Disp: , Rfl:    gabapentin (NEURONTIN) 300 MG capsule, gabapentin 300 mg capsule  TAKE 1 CAPSULE BY MOUTH 4 TIMES A DAY, Disp: , Rfl:    glipiZIDE (GLUCOTROL) 5 MG tablet, glipizide 5 mg tablet  TAKE 1 TABLET BY MOUTH TWICE DAILY, Disp: , Rfl:    glucose blood (TRUE METRIX BLOOD GLUCOSE TEST) test strip, 4 times daily. Generic OK if available. Other brands OK based on price., Disp: 100 each, Rfl: 12   HYDROcodone-acetaminophen (NORCO/VICODIN) 5-325 MG tablet, Take 1 tablet by mouth every 6 (six) hours as needed for moderate pain., Disp: , Rfl:    hydrocortisone 2.5 % cream, APPLY TO RASH AFTER BATHING, Disp: , Rfl: 3   hydrOXYzine (ATARAX) 25 MG tablet, hydroxyzine HCl 25 mg tablet  TAKE 1 TABLET BY MOUTH AT NIGHT AS NEEDED FOR SLEEP, Disp: , Rfl:    levocetirizine (XYZAL) 5 MG tablet, Take 5 mg by mouth every evening., Disp: , Rfl:    lidocaine (XYLOCAINE) 5 % ointment, Apply 1 application  topically as needed., Disp: 35.44 g, Rfl: 0   montelukast (SINGULAIR) 10 MG tablet, Take 10 mg by mouth at  bedtime., Disp: , Rfl:    Multiple Vitamin (MULTIVITAMIN WITH MINERALS) TABS, Take 1 tablet by mouth daily., Disp: , Rfl:    promethazine (PHENERGAN) 25 MG suppository, Place 1 suppository (25 mg total) rectally every 6 (six) hours as needed for nausea or vomiting., Disp: 12 each, Rfl: 0   promethazine (PHENERGAN) 25 MG tablet, Take 1 tablet (25 mg total) by mouth every 6 (six) hours as needed for nausea or vomiting., Disp: 30 tablet, Rfl: 0   rosuvastatin (CRESTOR) 5 MG tablet,  Take 5 mg by mouth daily., Disp: , Rfl:    sitaGLIPtin-metformin (JANUMET) 50-500 MG tablet, Take 1 tablet by mouth 2 (two) times daily with a meal. , Disp: , Rfl:    traMADol (ULTRAM) 50 MG tablet, Take 50 mg by mouth every 6 (six) hours as needed for moderate pain. , Disp: , Rfl:    traMADol (ULTRAM) 50 MG tablet, Take 1 tablet (50 mg total) by mouth every 6 (six) hours as needed., Disp: 10 tablet, Rfl: 0   triamterene-hydrochlorothiazide (DYAZIDE) 37.5-25 MG capsule, Take 1 capsule by mouth daily., Disp: , Rfl:    TRUEPLUS LANCETS 28G MISC, 1 each by Does not apply route 4 (four) times daily. Generic OK if available. Other brands OK based on price., Disp: 210 each, Rfl: 2   Turmeric 500 MG CAPS, Take by mouth., Disp: , Rfl:    valACYclovir (VALTREX) 500 MG tablet, TAKE 1 TABLET BY MOUTH TWICE A DAY FOR 3 DAYS, Disp: , Rfl: 2   vitamin B-12 (CYANOCOBALAMIN) 1000 MCG tablet, Take 2,000 mcg by mouth daily., Disp: , Rfl:    Allergies  Allergen Reactions   Leflunomide     Other reaction(s): swelling   Metformin Diarrhea   Advair Diskus [Fluticasone-Salmeterol] Palpitations   Methocarbamol Palpitations   Other Palpitations    "Muscle relaxer that starts with an M" methocarbamol?    Past Medical History:  Diagnosis Date   Anxiety    Asthma    Chronic kidney disease (CKD), stage III (moderate) (HCC)    Chronic lower back pain    Daily headache    DKA (diabetic ketoacidoses) admission 03/03/2016   Fibroid     Fibromyalgia    GERD (gastroesophageal reflux disease)    HTN (hypertension)    Pelvic pain in female    Pneumonia 05/2013   PONV (postoperative nausea and vomiting)    Renal insufficiency      Past Surgical History:  Procedure Laterality Date   BREAST BIOPSY Right    BREAST EXCISIONAL BIOPSY Right    DILATION AND CURETTAGE OF UTERUS  2008   RADIOACTIVE SEED GUIDED EXCISIONAL BREAST BIOPSY Right 11/13/2018   Procedure: RIGHT BREAST RADIOACTIVE SEED GUIDED EXCISIONAL BIOPSY;  Surgeon: Rolm Bookbinder, MD;  Location: [facility];  Service: General;  Laterality: Right;    Family History  Problem Relation Age of Onset   Cancer Mother    Breast cancer Mother    Cancer Brother    Hypertension Brother    Breast cancer Maternal Grandmother     Social History   Tobacco Use   Smoking status: Some Days    Packs/day: 0.25    Years: 26.00    Total pack years: 6.50    Types: Cigarettes   Smokeless tobacco: Never  Substance Use Topics   Alcohol use: No   Drug use: No    ROS   Objective:   Vitals: BP 117/78 (BP Location: Left Arm)   Pulse 82   Temp 98.4 F (36.9 C) (Oral)   Resp 18   LMP 05/21/2013   SpO2 96%   Physical Exam Constitutional:      General: She is not in acute distress.    Appearance: Normal appearance. She is well-developed. She is not ill-appearing, toxic-appearing or diaphoretic.  HENT:     Head: Normocephalic and atraumatic.     Nose: Nose normal.     Mouth/Throat:     Mouth: Mucous membranes are moist.     Pharynx: No  oropharyngeal exudate or posterior oropharyngeal erythema.     Comments: Airway is patent. Eyes:     General: No scleral icterus.       Right eye: No discharge.        Left eye: No discharge.     Extraocular Movements: Extraocular movements intact.  Cardiovascular:     Rate and Rhythm: Normal rate and regular rhythm.     Heart sounds: Normal heart sounds. No murmur heard.    No friction rub. No gallop.   Pulmonary:     Effort: Pulmonary effort is normal. No respiratory distress.     Breath sounds: No stridor. No wheezing, rhonchi or rales.  Chest:     Chest wall: No tenderness.  Skin:    General: Skin is warm and dry.     Findings: Rash (scattered petechial lesions over the hands and feet) present.  Neurological:     General: No focal deficit present.     Mental Status: She is alert and oriented to person, place, and time.  Psychiatric:        Mood and Affect: Mood normal.        Behavior: Behavior normal.     Assessment and Plan :   PDMP not reviewed this encounter.  1. Rash and nonspecific skin eruption   2. Rheumatoid arthritis, involving unspecified site, unspecified whether rheumatoid factor present ([place])   3. Petechiae    Suspect this is related to the use of Simponi.  I do not suspect an anaphylactic reaction.  Patient requested to not have extensive blood work done.  I deferred HIV, syphilis, tickborne illness testing as such.  Recommended getting comprehensive metabolic panel, CBC at the very least.  She was very agreeable.  Recommended follow-up with her rheumatologist soon as possible. Counseled patient on potential for adverse effects with medications prescribed/recommended today, ER and return-to-clinic precautions discussed, patient verbalized understanding.     Jaynee Eagles, [place] 07/15/22 1651

## 2022-07-15 NOTE — ED Triage Notes (Signed)
The patient states she has generalized red spots over her body. She states she does not know if something bit her. The patient denies SOB.  Home interventions: [place] 10  Started: Monday

## 2022-07-19 LAB — CBC WITH DIFFERENTIAL/PLATELET
Basophils Absolute: 0.1 10*3/uL (ref 0.0–0.2)
Basos: 1 %
EOS (ABSOLUTE): 0.2 10*3/uL (ref 0.0–0.4)
Eos: 2 %
Hematocrit: 40.3 % (ref 34.0–46.6)
Hemoglobin: 13.5 g/dL (ref 11.1–15.9)
Immature Grans (Abs): 0 10*3/uL (ref 0.0–0.1)
Immature Granulocytes: 0 %
Lymphocytes Absolute: 6 10*3/uL — ABNORMAL HIGH (ref 0.7–3.1)
Lymphs: 56 %
MCH: 30.9 pg (ref 26.6–33.0)
MCHC: 33.5 g/dL (ref 31.5–35.7)
MCV: 92 fL (ref 79–97)
Monocytes Absolute: 0.6 10*3/uL (ref 0.1–0.9)
Monocytes: 6 %
Neutrophils Absolute: 3.8 10*3/uL (ref 1.4–7.0)
Neutrophils: 35 %
Platelets: 246 10*3/uL (ref 150–450)
RBC: 4.37 x10E6/uL (ref 3.77–5.28)
RDW: 13.3 % (ref 11.7–15.4)
WBC: 10.7 10*3/uL (ref 3.4–10.8)

## 2022-07-19 LAB — COMPREHENSIVE METABOLIC PANEL

## 2022-09-21 ENCOUNTER — Encounter: Payer: Self-pay | Admitting: Podiatry

## 2022-09-21 ENCOUNTER — Ambulatory Visit (INDEPENDENT_AMBULATORY_CARE_PROVIDER_SITE_OTHER): Payer: 59 | Admitting: Podiatry

## 2022-09-21 DIAGNOSIS — B351 Tinea unguium: Secondary | ICD-10-CM

## 2022-09-21 DIAGNOSIS — M79675 Pain in left toe(s): Secondary | ICD-10-CM | POA: Diagnosis not present

## 2022-09-21 DIAGNOSIS — M79674 Pain in right toe(s): Secondary | ICD-10-CM

## 2022-09-21 NOTE — Progress Notes (Signed)
  Subjective:  Patient ID: Maria [name], female    DOB: [birth year],  MRN: [id number]  Chief Complaint  Patient presents with   Diabetes    Hard place on the bottom of foot    61 y.o. female returns for the above complaint.  Patient presents with thickened elongated dystrophic toenails x10 mild pain on palpation she is not able to debride down herself.  She would like for me to do it.  Denies any other acute complaints.  Objective:  There were no vitals filed for this visit. Podiatric Exam: Vascular: dorsalis pedis and posterior tibial pulses are palpable bilateral. Capillary return is immediate. Temperature gradient is WNL. Skin turgor WNL  Sensorium: Normal Semmes Weinstein monofilament test. Normal tactile sensation bilaterally. Nail Exam: Pt has thick disfigured discolored nails with subungual debris noted bilateral entire nail hallux through fifth toenails.  Pain on palpation to the nails. Ulcer Exam: There is no evidence of ulcer or pre-ulcerative changes or infection. Orthopedic Exam: Muscle tone and strength are WNL. No limitations in general ROM. No crepitus or effusions noted.  Skin: No Porokeratosis. No infection or ulcers    Assessment & Plan:   1. Pain due to onychomycosis of toenails of both feet     Patient was evaluated and treated and all questions answered.  Onychomycosis with pain  -Nails palliatively debrided as below. -Educated on self-care  Procedure: Nail Debridement Rationale: pain  Type of Debridement: manual, sharp debridement. Instrumentation: Nail nipper, rotary burr. Number of Nails: 10  Procedures and Treatment: Consent by patient was obtained for treatment procedures. The patient understood the discussion of treatment and procedures well. All questions were answered thoroughly reviewed. Debridement of mycotic and hypertrophic toenails, 1 through 5 bilateral and clearing of subungual debris. No ulceration, no infection noted.  Return  Visit-Office Procedure: Patient instructed to return to the office for a follow up visit 3 months for continued evaluation and treatment.  Boneta Lucks, DPM    Return in about 3 months (around 12/21/2022).

## 2022-12-21 ENCOUNTER — Ambulatory Visit: Payer: 59 | Admitting: Podiatry

## 2022-12-28 ENCOUNTER — Ambulatory Visit: Payer: Self-pay | Admitting: Podiatry

## 2023-01-02 ENCOUNTER — Ambulatory Visit: Payer: BC Managed Care – PPO | Admitting: Podiatry

## 2023-01-02 ENCOUNTER — Encounter: Payer: Self-pay | Admitting: Podiatry

## 2023-01-02 DIAGNOSIS — M79675 Pain in left toe(s): Secondary | ICD-10-CM

## 2023-01-02 DIAGNOSIS — N189 Chronic kidney disease, unspecified: Secondary | ICD-10-CM

## 2023-01-02 DIAGNOSIS — Q828 Other specified congenital malformations of skin: Secondary | ICD-10-CM | POA: Insufficient documentation

## 2023-01-02 DIAGNOSIS — B351 Tinea unguium: Secondary | ICD-10-CM

## 2023-01-02 DIAGNOSIS — M79674 Pain in right toe(s): Secondary | ICD-10-CM | POA: Diagnosis not present

## 2023-01-02 NOTE — Progress Notes (Addendum)
This patient presents to the office for evaluation of her feet.  She says her nails have become long and thickful and are painful to walk.  She says she has painful callus on her left forefoot.  She also says she is waiting for the results of nerve tests by Dr.  Posey Pronto.  Patient has diagnosis of diabetes and CKD.  Patient presents to the office for evaluation and treatment.  General Appearance  Alert, conversant and in no acute stress.  Vascular  Dorsalis pedis and posterior tibial  pulses are palpable  bilaterally.  Capillary return is within normal limits  bilaterally. Temperature is within normal limits  bilaterally.  Neurologic  Senn-Weinstein monofilament wire test within normal limits  bilaterally. Muscle power within normal limits bilaterally.  Nails Thick disfigured discolored nails with subungual debris  from hallux to fifth toes bilaterally. No evidence of bacterial infection or drainage bilaterally.  Orthopedic  No limitations of motion  feet .  No crepitus or effusions noted.  HAV  B/L.  Skin  normotropic skin  noted bilaterally.  No signs of infections or ulcers noted.  Porokeratosis sub 2 left foot.  Onychomycosis  Porokeratosis left forefoot.  ROV  Debride nails with nail nipper and dremel tool.  Debride porokeratosis sub 2 left forefoot with # 15 blade and dremel tool.  Dispense sheet for padding.  RTC 3 months.  Patient was told to return to Dr.  Posey Pronto for nerve results.   Gardiner Barefoot DPM

## 2023-01-09 DIAGNOSIS — M797 Fibromyalgia: Secondary | ICD-10-CM | POA: Diagnosis not present

## 2023-01-09 DIAGNOSIS — M1990 Unspecified osteoarthritis, unspecified site: Secondary | ICD-10-CM | POA: Diagnosis not present

## 2023-01-09 DIAGNOSIS — M0579 Rheumatoid arthritis with rheumatoid factor of multiple sites without organ or systems involvement: Secondary | ICD-10-CM | POA: Diagnosis not present

## 2023-01-09 DIAGNOSIS — Z79899 Other long term (current) drug therapy: Secondary | ICD-10-CM | POA: Diagnosis not present

## 2023-01-13 DIAGNOSIS — E1165 Type 2 diabetes mellitus with hyperglycemia: Secondary | ICD-10-CM | POA: Diagnosis not present

## 2023-01-13 DIAGNOSIS — N1831 Chronic kidney disease, stage 3a: Secondary | ICD-10-CM | POA: Diagnosis not present

## 2023-01-13 DIAGNOSIS — L403 Pustulosis palmaris et plantaris: Secondary | ICD-10-CM | POA: Diagnosis not present

## 2023-01-13 DIAGNOSIS — E782 Mixed hyperlipidemia: Secondary | ICD-10-CM | POA: Diagnosis not present

## 2023-01-13 DIAGNOSIS — I10 Essential (primary) hypertension: Secondary | ICD-10-CM | POA: Diagnosis not present

## 2023-01-13 DIAGNOSIS — K2100 Gastro-esophageal reflux disease with esophagitis, without bleeding: Secondary | ICD-10-CM | POA: Diagnosis not present

## 2023-02-13 ENCOUNTER — Other Ambulatory Visit: Payer: Self-pay | Admitting: Internal Medicine

## 2023-02-13 ENCOUNTER — Ambulatory Visit
Admission: RE | Admit: 2023-02-13 | Discharge: 2023-02-13 | Disposition: A | Discharge status: Home / Self Care | Payer: BC Managed Care – PPO | Source: Ambulatory Visit | Attending: Internal Medicine | Admitting: Internal Medicine

## 2023-02-13 DIAGNOSIS — J45909 Unspecified asthma, uncomplicated: Secondary | ICD-10-CM

## 2023-02-13 DIAGNOSIS — R059 Cough, unspecified: Secondary | ICD-10-CM

## 2023-02-13 DIAGNOSIS — R197 Diarrhea, unspecified: Secondary | ICD-10-CM

## 2023-02-13 DIAGNOSIS — K5900 Constipation, unspecified: Secondary | ICD-10-CM | POA: Diagnosis not present

## 2023-02-14 DIAGNOSIS — M797 Fibromyalgia: Secondary | ICD-10-CM | POA: Diagnosis not present

## 2023-02-14 DIAGNOSIS — Z79899 Other long term (current) drug therapy: Secondary | ICD-10-CM | POA: Diagnosis not present

## 2023-02-14 DIAGNOSIS — M1990 Unspecified osteoarthritis, unspecified site: Secondary | ICD-10-CM | POA: Diagnosis not present

## 2023-02-14 DIAGNOSIS — L4050 Arthropathic psoriasis, unspecified: Secondary | ICD-10-CM | POA: Diagnosis not present

## 2023-02-16 DIAGNOSIS — Z79899 Other long term (current) drug therapy: Secondary | ICD-10-CM | POA: Diagnosis not present

## 2023-02-21 ENCOUNTER — Ambulatory Visit: Payer: BC Managed Care – PPO | Admitting: Podiatry

## 2023-02-21 DIAGNOSIS — Q828 Other specified congenital malformations of skin: Secondary | ICD-10-CM | POA: Diagnosis not present

## 2023-02-21 NOTE — Progress Notes (Signed)
Subjective:  Patient ID: Maria [name], female    DOB: Oct 15, 1961,  MRN: [id number]  Chief Complaint  Patient presents with   Callouses    62 y.o. female presents with the above complaint.  Patient presents with left submetatarsal 2 porokeratotic lesion with underlying plantarflexed second metatarsal.  Patient states pain for touch is progressive gotten worse worse with ambulation worse with pressure she would like to have it debrided down.  She states it gives her some relief.  She is a diabetic with last A1c of 13%.   Review of Systems: Negative except as noted in the HPI. Denies N/V/F/Ch.  Past Medical History:  Diagnosis Date   Anxiety    Asthma    Chronic kidney disease (CKD), stage III (moderate) (HCC)    Chronic lower back pain    Daily headache    DKA (diabetic ketoacidoses) admission 03/03/2016   Fibroid    Fibromyalgia    GERD (gastroesophageal reflux disease)    HTN (hypertension)    Pelvic pain in female    Pneumonia 05/2013   PONV (postoperative nausea and vomiting)    Renal insufficiency     Current Outpatient Medications:    albuterol (VENTOLIN HFA) 108 (90 Base) MCG/ACT inhaler, albuterol sulfate HFA 90 mcg/actuation aerosol inhaler  INHALE 1 TO 2 PUFFS INTO THE LUNGS EVERY 4 TO 6 HOURS FOR COUGH/WHEEZE, Disp: , Rfl:    albuterol (VENTOLIN HFA) 108 (90 Base) MCG/ACT inhaler, SMARTSIG:1-2 Puff(s) Via Inhaler Every 4-6 Hours, Disp: , Rfl:    ALPRAZolam (XANAX) 1 MG tablet, Take 1 mg by mouth at bedtime as needed for sleep or anxiety., Disp: , Rfl:    ALPRAZolam (XANAX) 1 MG tablet, 1 tablet, Disp: , Rfl:    ARIPiprazole (ABILIFY) 5 MG tablet, aripiprazole 5 mg tablet  TAKE 1 TABLET BY MOUTH IN THE MORNING ONCE DAILY, Disp: , Rfl:    ARNUITY ELLIPTA 100 MCG/ACT AEPB, Inhale 1 puff into the lungs daily., Disp: , Rfl: 6   Aspirin-Salicylamide-Caffeine (BC HEADACHE POWDER PO), Take by mouth., Disp: , Rfl:    atenolol (TENORMIN) 25 MG tablet, Take 25 mg by  mouth daily., Disp: , Rfl:    azelastine (ASTELIN) 0.1 % nasal spray, Place into the nose., Disp: , Rfl:    Azelastine HCl 0.15 % SOLN, SPRAY TWICE INTO EACH NOSTRIL TWICE A DAY, Disp: , Rfl: 3   Azelastine HCl 0.15 % SOLN, azelastine 0.15 % (205.5 mcg) nasal spray, Disp: , Rfl:    azithromycin (ZITHROMAX) 250 MG tablet, azithromycin 250 mg tablet, Disp: , Rfl:    benzonatate (TESSALON) 100 MG capsule, benzonatate 100 mg capsule  TAKE 1-2 CAPSULES 3 TIMES A DAY AS NEEDED FOR COUGH, Disp: , Rfl:    Blood Glucose Monitoring Suppl (TRUE METRIX METER) DEVI, 1 each by Does not apply route 4 (four) times daily. Generic OK if available. Other brands OK based on price., Disp: 1 Device, Rfl: 0   calcium elemental as carbonate (BARIATRIC TUMS ULTRA) 400 MG chewable tablet, Chew 2 tablets by mouth at bedtime., Disp: , Rfl:    carisoprodol (SOMA) 250 MG tablet, Take 350 mg by mouth at bedtime. , Disp: , Rfl:    cefpodoxime (VANTIN) 200 MG tablet, Take 200 mg by mouth daily., Disp: , Rfl:    cyclobenzaprine (FLEXERIL) 10 MG tablet, cyclobenzaprine 10 mg tablet, Disp: , Rfl:    dexlansoprazole (DEXILANT) 60 MG capsule, Dexilant 60 mg capsule, delayed release  TAKE ONE CAPSULE BY MOUTH EVERY  DAY, Disp: , Rfl:    diclofenac (VOLTAREN) 75 MG EC tablet, diclofenac sodium 75 mg tablet,delayed release  TAKE 1 TABLET BY MOUTH TWICE A DAY AS NEEDED FOR PAIN, Disp: , Rfl:    diltiazem (CARDIZEM CD) 360 MG 24 hr capsule, Take 360 mg by mouth daily., Disp: , Rfl: 2   diltiazem (CARDIZEM LA) 360 MG 24 hr tablet, 1 tablet, Disp: , Rfl:    diltiazem (TIAZAC) 300 MG 24 hr capsule, diltiazem CD 300 mg capsule,extended release 24 hr  TAKE ONE CAPSULE BY MOUTH EVERY DAY, Disp: , Rfl:    Dulaglutide (TRULICITY) 1.5 0000000 SOPN, Trulicity 1.5 99991111 mL subcutaneous pen injector  ADMINISTER 1.5 MG UNDER THE SKIN 1 TIME A WEEK, Disp: , Rfl:    esomeprazole (NEXIUM) 20 MG capsule, Take 20 mg by mouth daily at 12 noon., Disp: , Rfl:     estazolam (PROSOM) 2 MG tablet, estazolam 2 mg tablet  TAKE 1 TABLET BY MOUTH EVERYDAY AT BEDTIME, Disp: , Rfl:    etodolac (LODINE) 400 MG tablet, etodolac 400 mg tablet, Disp: , Rfl:    famotidine (PEPCID) 40 MG tablet, 1 tablet at bedtime., Disp: , Rfl:    fentaNYL (DURAGESIC) 12 MCG/HR, fentanyl 12 mcg/hr transdermal patch, Disp: , Rfl:    fish oil-omega-3 fatty acids 1000 MG capsule, Take 2 g by mouth daily., Disp: , Rfl:    fluconazole (DIFLUCAN) 100 MG tablet, fluconazole 100 mg tablet, Disp: , Rfl:    fluocinonide cream (LIDEX) 0.05 %, fluocinonide 0.05 % topical cream  APPLY TO ARMS AFTER BATHING FOR 4 WEEKS, Disp: , Rfl:    fluticasone (FLONASE) 50 MCG/ACT nasal spray, Place 2 sprays into the nose daily., Disp: , Rfl:    fluticasone (FLONASE) 50 MCG/ACT nasal spray, Place into the nose., Disp: , Rfl:    Fluticasone Furoate 100 MCG/ACT AEPB, Inhale into the lungs., Disp: , Rfl:    gabapentin (NEURONTIN) 100 MG capsule, Take 100 mg by mouth every evening. Reported on 03/03/2016, Disp: , Rfl:    gabapentin (NEURONTIN) 100 MG capsule, gabapentin 100 mg capsule, Disp: , Rfl:    gabapentin (NEURONTIN) 300 MG capsule, gabapentin 300 mg capsule  TAKE 1 CAPSULE BY MOUTH 4 TIMES A DAY, Disp: , Rfl:    glipiZIDE (GLUCOTROL) 5 MG tablet, glipizide 5 mg tablet  TAKE 1 TABLET BY MOUTH TWICE DAILY, Disp: , Rfl:    glucose blood (TRUE METRIX BLOOD GLUCOSE TEST) test strip, 4 times daily. Generic OK if available. Other brands OK based on price., Disp: 100 each, Rfl: 12   HYDROcodone-acetaminophen (NORCO/VICODIN) 5-325 MG tablet, Take 1 tablet by mouth every 6 (six) hours as needed for moderate pain., Disp: , Rfl:    hydrocortisone 2.5 % cream, APPLY TO RASH AFTER BATHING, Disp: , Rfl: 3   hydrOXYzine (ATARAX) 25 MG tablet, hydroxyzine HCl 25 mg tablet  TAKE 1 TABLET BY MOUTH AT NIGHT AS NEEDED FOR SLEEP, Disp: , Rfl:    levocetirizine (XYZAL) 5 MG tablet, Take 5 mg by mouth every evening., Disp: , Rfl:     lidocaine (XYLOCAINE) 5 % ointment, Apply 1 application  topically as needed., Disp: 35.44 g, Rfl: 0   montelukast (SINGULAIR) 10 MG tablet, Take 10 mg by mouth at bedtime., Disp: , Rfl:    Multiple Vitamin (MULTIVITAMIN WITH MINERALS) TABS, Take 1 tablet by mouth daily., Disp: , Rfl:    promethazine (PHENERGAN) 25 MG suppository, Place 1 suppository (25 mg total) rectally every 6 (  six) hours as needed for nausea or vomiting., Disp: 12 each, Rfl: 0   promethazine (PHENERGAN) 25 MG tablet, Take 1 tablet (25 mg total) by mouth every 6 (six) hours as needed for nausea or vomiting., Disp: 30 tablet, Rfl: 0   rosuvastatin (CRESTOR) 5 MG tablet, Take 5 mg by mouth daily., Disp: , Rfl:    sitaGLIPtin-metformin (JANUMET) 50-500 MG tablet, Take 1 tablet by mouth 2 (two) times daily with a meal. , Disp: , Rfl:    traMADol (ULTRAM) 50 MG tablet, Take 50 mg by mouth every 6 (six) hours as needed for moderate pain. , Disp: , Rfl:    traMADol (ULTRAM) 50 MG tablet, Take 1 tablet (50 mg total) by mouth every 6 (six) hours as needed., Disp: 10 tablet, Rfl: 0   triamterene-hydrochlorothiazide (DYAZIDE) 37.5-25 MG capsule, Take 1 capsule by mouth daily., Disp: , Rfl:    TRUEPLUS LANCETS 28G MISC, 1 each by Does not apply route 4 (four) times daily. Generic OK if available. Other brands OK based on price., Disp: 210 each, Rfl: 2   Turmeric 500 MG CAPS, Take by mouth., Disp: , Rfl:    valACYclovir (VALTREX) 500 MG tablet, TAKE 1 TABLET BY MOUTH TWICE A DAY FOR 3 DAYS, Disp: , Rfl: 2   vitamin B-12 (CYANOCOBALAMIN) 1000 MCG tablet, Take 2,000 mcg by mouth daily., Disp: , Rfl:   Social History   Tobacco Use  Smoking Status Some Days   Packs/day: 0.25   Years: 26.00   Total pack years: 6.50   Types: Cigarettes  Smokeless Tobacco Never    Allergies  Allergen Reactions   Leflunomide     Other reaction(s): swelling   Metformin Diarrhea   Advair Diskus [Fluticasone-Salmeterol] Palpitations   Methocarbamol  Palpitations   Other Palpitations    "Muscle relaxer that starts with an M" methocarbamol?   Objective:  There were no vitals filed for this visit. There is no height or weight on file to calculate BMI. Constitutional Well developed. Well nourished.  Vascular Dorsalis pedis pulses palpable bilaterally. Posterior tibial pulses palpable bilaterally. Capillary refill normal to all digits.  No cyanosis or clubbing noted. Pedal hair growth normal.  Neurologic Normal speech. Oriented to person, place, and time. Epicritic sensation to light touch grossly present bilaterally.  Dermatologic Left submetatarsal 2 porokeratotic lesion with underlying plantarflexed fifth metatarsal noted pain on palpation to the lesion  Orthopedic: Normal joint ROM without pain or crepitus bilaterally. No visible deformities. No bony tenderness.   Radiographs: None Assessment:   1. Porokeratosis    Plan:  Patient was evaluated and treated and all questions answered.  Left submetatarsal 2 porokeratosis with underlying plantarflexed second metatarsal -All questions or concerns were discussed with the patient in extensive detail -Using chisel blade handle the lesion was debrided down to healthy striated tissue as a courtesy.  She had some immediate relief. -I explained to the patient that she would benefit from shoe gear modification as well I discussed this with her she states understanding.  No follow-ups on file.

## 2023-03-02 DIAGNOSIS — N1831 Chronic kidney disease, stage 3a: Secondary | ICD-10-CM | POA: Diagnosis not present

## 2023-03-02 DIAGNOSIS — K2100 Gastro-esophageal reflux disease with esophagitis, without bleeding: Secondary | ICD-10-CM | POA: Diagnosis not present

## 2023-03-02 DIAGNOSIS — I10 Essential (primary) hypertension: Secondary | ICD-10-CM | POA: Diagnosis not present

## 2023-03-02 DIAGNOSIS — E1165 Type 2 diabetes mellitus with hyperglycemia: Secondary | ICD-10-CM | POA: Diagnosis not present

## 2023-03-07 ENCOUNTER — Other Ambulatory Visit: Payer: Self-pay | Admitting: Obstetrics and Gynecology

## 2023-03-07 DIAGNOSIS — Z1231 Encounter for screening mammogram for malignant neoplasm of breast: Secondary | ICD-10-CM

## 2023-03-30 DIAGNOSIS — I119 Hypertensive heart disease without heart failure: Secondary | ICD-10-CM | POA: Diagnosis not present

## 2023-03-30 DIAGNOSIS — N1831 Chronic kidney disease, stage 3a: Secondary | ICD-10-CM | POA: Diagnosis not present

## 2023-03-30 DIAGNOSIS — K2100 Gastro-esophageal reflux disease with esophagitis, without bleeding: Secondary | ICD-10-CM | POA: Diagnosis not present

## 2023-03-30 DIAGNOSIS — E1165 Type 2 diabetes mellitus with hyperglycemia: Secondary | ICD-10-CM | POA: Diagnosis not present

## 2023-04-03 ENCOUNTER — Emergency Department (HOSPITAL_COMMUNITY): Payer: BC Managed Care – PPO

## 2023-04-03 ENCOUNTER — Ambulatory Visit (HOSPITAL_COMMUNITY)
Admission: EM | Admit: 2023-04-03 | Discharge: 2023-04-03 | Disposition: A | Discharge status: Home / Self Care | Payer: BC Managed Care – PPO | Attending: Emergency Medicine | Admitting: Emergency Medicine

## 2023-04-03 ENCOUNTER — Encounter (HOSPITAL_COMMUNITY): Payer: Self-pay

## 2023-04-03 ENCOUNTER — Ambulatory Visit (HOSPITAL_COMMUNITY): Payer: BC Managed Care – PPO

## 2023-04-03 ENCOUNTER — Emergency Department (HOSPITAL_COMMUNITY)
Admission: EM | Admit: 2023-04-03 | Discharge: 2023-04-03 | Disposition: A | Discharge status: Home / Self Care | Payer: BC Managed Care – PPO | Attending: Emergency Medicine | Admitting: Emergency Medicine

## 2023-04-03 DIAGNOSIS — W01198A Fall on same level from slipping, tripping and stumbling with subsequent striking against other object, initial encounter: Secondary | ICD-10-CM | POA: Diagnosis not present

## 2023-04-03 DIAGNOSIS — R9431 Abnormal electrocardiogram [ECG] [EKG]: Secondary | ICD-10-CM | POA: Diagnosis not present

## 2023-04-03 DIAGNOSIS — R519 Headache, unspecified: Secondary | ICD-10-CM | POA: Diagnosis not present

## 2023-04-03 DIAGNOSIS — S40012A Contusion of left shoulder, initial encounter: Secondary | ICD-10-CM | POA: Diagnosis not present

## 2023-04-03 DIAGNOSIS — I10 Essential (primary) hypertension: Secondary | ICD-10-CM | POA: Diagnosis not present

## 2023-04-03 DIAGNOSIS — S0990XA Unspecified injury of head, initial encounter: Secondary | ICD-10-CM | POA: Insufficient documentation

## 2023-04-03 DIAGNOSIS — Z794 Long term (current) use of insulin: Secondary | ICD-10-CM | POA: Insufficient documentation

## 2023-04-03 DIAGNOSIS — M542 Cervicalgia: Secondary | ICD-10-CM | POA: Diagnosis not present

## 2023-04-03 DIAGNOSIS — S01312A Laceration without foreign body of left ear, initial encounter: Secondary | ICD-10-CM

## 2023-04-03 DIAGNOSIS — Z79899 Other long term (current) drug therapy: Secondary | ICD-10-CM | POA: Diagnosis not present

## 2023-04-03 DIAGNOSIS — M25512 Pain in left shoulder: Secondary | ICD-10-CM | POA: Diagnosis not present

## 2023-04-03 DIAGNOSIS — I2510 Atherosclerotic heart disease of native coronary artery without angina pectoris: Secondary | ICD-10-CM | POA: Diagnosis not present

## 2023-04-03 DIAGNOSIS — Z7982 Long term (current) use of aspirin: Secondary | ICD-10-CM | POA: Insufficient documentation

## 2023-04-03 DIAGNOSIS — Y92003 Bedroom of unspecified non-institutional (private) residence as the place of occurrence of the external cause: Secondary | ICD-10-CM | POA: Insufficient documentation

## 2023-04-03 DIAGNOSIS — M25532 Pain in left wrist: Secondary | ICD-10-CM | POA: Diagnosis not present

## 2023-04-03 DIAGNOSIS — S0991XA Unspecified injury of ear, initial encounter: Secondary | ICD-10-CM | POA: Diagnosis not present

## 2023-04-03 DIAGNOSIS — Z23 Encounter for immunization: Secondary | ICD-10-CM | POA: Diagnosis not present

## 2023-04-03 DIAGNOSIS — R0789 Other chest pain: Secondary | ICD-10-CM | POA: Diagnosis not present

## 2023-04-03 DIAGNOSIS — Z043 Encounter for examination and observation following other accident: Secondary | ICD-10-CM | POA: Diagnosis not present

## 2023-04-03 DIAGNOSIS — W19XXXA Unspecified fall, initial encounter: Secondary | ICD-10-CM

## 2023-04-03 DIAGNOSIS — T148XXA Other injury of unspecified body region, initial encounter: Secondary | ICD-10-CM

## 2023-04-03 LAB — CBC
HCT: 45.2 % (ref 36.0–46.0)
Hemoglobin: 13.9 g/dL (ref 12.0–15.0)
MCH: 30.2 pg (ref 26.0–34.0)
MCHC: 30.8 g/dL (ref 30.0–36.0)
MCV: 98 fL (ref 80.0–100.0)
Platelets: 307 10*3/uL (ref 150–400)
RBC: 4.61 MIL/uL (ref 3.87–5.11)
RDW: 15.1 % (ref 11.5–15.5)
WBC: 12.3 10*3/uL — ABNORMAL HIGH (ref 4.0–10.5)
nRBC: 0 % (ref 0.0–0.2)

## 2023-04-03 LAB — BASIC METABOLIC PANEL
Anion gap: 8 (ref 5–15)
BUN: 18 mg/dL (ref 6–20)
CO2: 22 mmol/L (ref 22–32)
Calcium: 8.7 mg/dL — ABNORMAL LOW (ref 8.9–10.3)
Chloride: 108 mmol/L (ref 98–111)
Creatinine, Ser: 1.31 mg/dL — ABNORMAL HIGH (ref 0.44–1.00)
GFR, Estimated: 47 mL/min — ABNORMAL LOW (ref >60)
Glucose, Bld: 164 mg/dL — ABNORMAL HIGH (ref 70–99)
Potassium: 3.9 mmol/L (ref 3.5–5.1)
Sodium: 138 mmol/L (ref 135–145)

## 2023-04-03 MED ORDER — KETOROLAC TROMETHAMINE 30 MG/ML IJ SOLN
30.0000 mg | Freq: Once | INTRAMUSCULAR | Status: AC
  Administered 2023-04-03: 30 mg via INTRAMUSCULAR

## 2023-04-03 MED ORDER — LIDOCAINE-EPINEPHRINE (PF) 2 %-1:200000 IJ SOLN
20.0000 mL | Freq: Once | INTRAMUSCULAR | Status: AC
  Administered 2023-04-03: 20 mL
  Filled 2023-04-03: qty 20

## 2023-04-03 MED ORDER — ONDANSETRON 4 MG PO TBDP
4.0000 mg | ORAL_TABLET | Freq: Once | ORAL | Status: AC
  Administered 2023-04-03: 4 mg via ORAL
  Filled 2023-04-03: qty 1

## 2023-04-03 MED ORDER — OXYCODONE-ACETAMINOPHEN 5-325 MG PO TABS
1.0000 | ORAL_TABLET | Freq: Once | ORAL | Status: AC
  Administered 2023-04-03: 1 via ORAL
  Filled 2023-04-03: qty 1

## 2023-04-03 MED ORDER — MUPIROCIN CALCIUM 2 % EX CREA
TOPICAL_CREAM | Freq: Once | CUTANEOUS | Status: AC
  Administered 2023-04-03: 1 via TOPICAL
  Filled 2023-04-03: qty 15

## 2023-04-03 MED ORDER — CYCLOBENZAPRINE HCL 10 MG PO TABS: 10.0000 mg | ORAL_TABLET | Freq: Two times a day (BID) | ORAL | 0 refills | Status: DC | PRN

## 2023-04-03 MED ORDER — TETANUS-DIPHTH-ACELL PERTUSSIS 5-2.5-18.5 LF-MCG/0.5 IM SUSY
0.5000 mL | PREFILLED_SYRINGE | Freq: Once | INTRAMUSCULAR | Status: AC
  Administered 2023-04-03: 0.5 mL via INTRAMUSCULAR
  Filled 2023-04-03: qty 0.5

## 2023-04-03 MED ORDER — OXYCODONE-ACETAMINOPHEN 5-325 MG PO TABS: 1.0000 | ORAL_TABLET | Freq: Four times a day (QID) | ORAL | 0 refills | Status: DC | PRN

## 2023-04-03 MED ORDER — KETOROLAC TROMETHAMINE 30 MG/ML IJ SOLN
INTRAMUSCULAR | Status: AC
  Filled 2023-04-03: qty 1

## 2023-04-03 NOTE — Discharge Instructions (Addendum)
Unfortunately as the laceration to your involves your cartilage it will need a more detailed closure and therefore you will need to go to the nearest emergency department for further evaluation and management, while present they will also manage your shoulder

## 2023-04-03 NOTE — ED Notes (Signed)
Patient is being discharged from the Urgent Care and sent to the Emergency Department via POV . Per Hansel Starling, [place], patient is in need of higher level of care due to ear laceration. Patient is aware and verbalizes understanding of plan of care.  Vitals:   04/03/23 0813  BP: 139/84  Pulse: 72  Resp: 16  Temp: 98.2 F (36.8 C)  SpO2: 95%

## 2023-04-03 NOTE — ED Triage Notes (Signed)
Patient states she was making the bed and got tangled in the sheets causing her to fall hitting the bedside table.. Patient c/o left ear, left shoulder pain and bruising.  Patient denies taking blood thinners.  Patient denies taking any pain meds this AM.

## 2023-04-03 NOTE — ED Notes (Signed)
Patient transported to X-ray 

## 2023-04-03 NOTE — ED Provider Notes (Signed)
[facility]    CSN: [id number] Arrival date & time: 04/03/23  0802      History   Chief Complaint Chief Complaint  Patient presents with   Fall    HPI Maria [name] is a 62 y.o. female.   Patient presents for evaluation of a laceration occurring within an hour and left shoulder pain beginning after fall.  Endorses that she was attempting to walk around her bed when she got caught in the covers, fell landing on left side hitting her nightstand.  Experience severe pain and heaviness to the left shoulder, worsened on arms removed to the best he had been able to do so.  Has some bruising present.  Endorses a burning sensation to the left ear.  Has not attempted treatment.  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   Mild persistent asthma without complication 02/09/2017   Hyperglycemia 03/03/2016   CKD (chronic kidney disease), stage III 03/03/2016   CAP (community acquired pneumonia) 06/03/2013   GERD (gastroesophageal reflux disease) 06/03/2013   HTN (hypertension) 06/03/2013   Fibromyalgia syndrome 06/03/2013   Fibroids 08/07/2012   Renal insufficiency 08/07/2012   Herpes 08/07/2012    Past Surgical History:  Procedure Laterality Date   BREAST BIOPSY Right    BREAST EXCISIONAL BIOPSY Right    DILATION AND CURETTAGE OF UTERUS  2008   RADIOACTIVE SEED GUIDED EXCISIONAL BREAST BIOPSY Right 11/13/2018   Procedure: RIGHT BREAST RADIOACTIVE SEED GUIDED EXCISIONAL BIOPSY;  Surgeon: Emelia LoronWakefield, Matthew, MD;   Location: [facility];  Service: General;  Laterality: Right;    OB History     Gravida  1   Para  0   Term      Preterm      AB      Living  0      SAB      IAB      Ectopic      Multiple      Live Births               Home Medications    Prior to Admission medications   Medication Sig Start Date End Date Taking? Authorizing Provider  albuterol (VENTOLIN HFA) 108 (90 Base) MCG/ACT inhaler albuterol sulfate HFA 90 mcg/actuation aerosol inhaler  INHALE 1 TO 2 PUFFS INTO THE LUNGS EVERY 4 TO 6 HOURS FOR COUGH/WHEEZE    [provider]  albuterol (VENTOLIN HFA) 108 (90 Base) MCG/ACT inhaler SMARTSIG:1-2 Puff(s) Via Inhaler Every 4-6 Hours 01/17/22   [provider]  ALPRAZolam Prudy Feeler(XANAX) 1 MG tablet Take 1 mg by mouth at bedtime as needed for sleep or anxiety.    [provider]  ALPRAZolam Prudy Feeler(XANAX) 1 MG tablet 1 tablet 05/31/20   [provider]  ARIPiprazole (ABILIFY) 5 MG tablet aripiprazole 5 mg tablet  TAKE 1 TABLET BY MOUTH IN THE MORNING ONCE DAILY    [provider]  Inocente SallesARNUITY  ELLIPTA 100 MCG/ACT AEPB Inhale 1 puff into the lungs daily. 12/26/15   [provider]  Aspirin-Salicylamide-Caffeine (BC HEADACHE POWDER PO) Take by mouth.    [provider]  atenolol (TENORMIN) 25 MG tablet Take 25 mg by mouth daily.    [provider]  azelastine (ASTELIN) 0.1 % nasal spray Place into the nose.    [provider]  Azelastine HCl 0.15 % SOLN SPRAY TWICE INTO EACH NOSTRIL TWICE A DAY 07/05/16   [provider]  Azelastine HCl 0.15 % SOLN azelastine 0.15 % (205.5 mcg) nasal spray    [provider]  azithromycin (ZITHROMAX) 250 MG tablet azithromycin 250 mg tablet    [provider]  benzonatate (TESSALON) 100 MG capsule benzonatate 100 mg capsule  TAKE 1-2 CAPSULES 3 TIMES A DAY AS NEEDED FOR COUGH 04/30/20   [provider]  Blood Glucose  Monitoring Suppl (TRUE METRIX METER) DEVI 1 each by Does not apply route 4 (four) times daily. Generic OK if available. Other brands OK based on price. 03/06/16   Leatha Gilding, MD  calcium elemental as carbonate (BARIATRIC TUMS ULTRA) 400 MG chewable tablet Chew 2 tablets by mouth at bedtime.    [provider]  carisoprodol (SOMA) 250 MG tablet Take 350 mg by mouth at bedtime.     [provider]  cefpodoxime (VANTIN) 200 MG tablet Take 200 mg by mouth daily. 02/23/22   [provider]  cyclobenzaprine (FLEXERIL) 10 MG tablet cyclobenzaprine 10 mg tablet    [provider]  dexlansoprazole (DEXILANT) 60 MG capsule Dexilant 60 mg capsule, delayed release  TAKE ONE CAPSULE BY MOUTH EVERY DAY    [provider]  diclofenac (VOLTAREN) 75 MG EC tablet diclofenac sodium 75 mg tablet,delayed release  TAKE 1 TABLET BY MOUTH TWICE A DAY AS NEEDED FOR PAIN    [provider]  diltiazem (CARDIZEM CD) 360 MG 24 hr capsule Take 360 mg by mouth daily. 12/26/15   [provider]  diltiazem (CARDIZEM LA) 360 MG 24 hr tablet 1 tablet    [provider]  diltiazem (TIAZAC) 300 MG 24 hr capsule diltiazem CD 300 mg capsule,extended release 24 hr  TAKE ONE CAPSULE BY MOUTH EVERY DAY    [provider]  Dulaglutide (TRULICITY) 1.5 MG/0.5ML SOPN Trulicity 1.5 mg/0.5 mL subcutaneous pen injector  ADMINISTER 1.5 MG UNDER THE SKIN 1 TIME A WEEK 11/16/20   [provider]  esomeprazole (NEXIUM) 20 MG capsule Take 20 mg by mouth daily at 12 noon.    [provider]  estazolam (PROSOM) 2 MG tablet estazolam 2 mg tablet  TAKE 1 TABLET BY MOUTH EVERYDAY AT BEDTIME    [provider]  etodolac (LODINE) 400 MG tablet etodolac 400 mg tablet    [provider]  famotidine (PEPCID) 40 MG tablet 1 tablet at bedtime.    [provider]  fentaNYL (DURAGESIC) 12 MCG/HR fentanyl 12 mcg/hr transdermal patch     [provider]  fish oil-omega-3 fatty acids 1000 MG capsule Take 2 g by mouth daily.    [provider]  fluconazole (DIFLUCAN) 100 MG tablet fluconazole 100 mg tablet    [provider]  fluocinonide cream (LIDEX) 0.05 % fluocinonide 0.05 % topical cream  APPLY TO ARMS AFTER BATHING FOR 4 WEEKS    [provider]  fluticasone (FLONASE) 50 MCG/ACT nasal spray Place 2 sprays into the nose daily.    [provider]  fluticasone (FLONASE) 50 MCG/ACT nasal spray Place into the nose.    [provider]  Fluticasone Furoate 100 MCG/ACT AEPB Inhale into the lungs.    [provider]  gabapentin (NEURONTIN) 100 MG capsule Take 100 mg by mouth every evening. Reported on 03/03/2016    [provider]  gabapentin (NEURONTIN) 100 MG capsule gabapentin 100 mg capsule    [provider]  gabapentin (NEURONTIN) 300 MG capsule gabapentin 300 mg capsule  TAKE 1 CAPSULE BY MOUTH 4 TIMES A DAY    [provider]  glipiZIDE (GLUCOTROL) 5 MG tablet glipizide 5 mg tablet  TAKE 1 TABLET BY MOUTH TWICE DAILY    [provider]  glucose blood (TRUE METRIX BLOOD GLUCOSE TEST) test strip 4 times daily. Generic OK if available. Other brands OK based on price. 03/06/16   Leatha Gilding, MD  HYDROcodone-acetaminophen (NORCO/VICODIN) 5-325 MG tablet Take 1 tablet by mouth every 6 (six) hours as needed for moderate pain.    [provider]  hydrocortisone 2.5 % cream APPLY TO RASH AFTER BATHING 09/18/16   [provider]  hydrOXYzine (ATARAX) 25 MG tablet hydroxyzine HCl 25 mg tablet  TAKE 1 TABLET BY MOUTH AT NIGHT AS NEEDED FOR SLEEP    [provider]  levocetirizine (XYZAL) 5 MG tablet Take 5 mg by mouth every evening.    [provider]  lidocaine (XYLOCAINE) 5 % ointment Apply 1 application  topically as needed. 06/03/22   Candelaria Stagers, DPM  montelukast (SINGULAIR) 10 MG tablet Take 10  mg by mouth at bedtime.    [provider]  Multiple Vitamin (MULTIVITAMIN WITH MINERALS) TABS Take 1 tablet by mouth daily.    [provider]  promethazine (PHENERGAN) 25 MG suppository Place 1 suppository (25 mg total) rectally every 6 (six) hours as needed for nausea or vomiting. 12/30/20   Mesner, Barbara Cower, MD  promethazine (PHENERGAN) 25 MG tablet Take 1 tablet (25 mg total) by mouth every 6 (six) hours as needed for nausea or vomiting. 12/30/20   Mesner, Barbara Cower, MD  rosuvastatin (CRESTOR) 5 MG tablet Take 5 mg by mouth daily.    [provider]  sitaGLIPtin-metformin (JANUMET) 50-500 MG tablet Take 1 tablet by mouth 2 (two) times daily with a meal.     [provider]  traMADol (ULTRAM) 50 MG tablet Take 50 mg by mouth every 6 (six) hours as needed for moderate pain.     [provider]  traMADol (ULTRAM) 50 MG tablet Take 1 tablet (50 mg total) by mouth every 6 (six) hours as needed. 11/13/18   Emelia Loron, MD  triamterene-hydrochlorothiazide (DYAZIDE) 37.5-25 MG capsule Take 1 capsule by mouth daily.    [provider]  TRUEPLUS LANCETS 28G MISC 1 each by Does not apply route 4 (four) times daily. Generic OK if available. Other brands OK based on price. 03/06/16   Leatha Gilding, MD  Turmeric 500 MG CAPS Take by mouth.    [provider]  valACYclovir (VALTREX) 500 MG tablet TAKE 1 TABLET BY MOUTH TWICE A DAY FOR 3 DAYS 07/17/16   [provider]  vitamin B-12 (CYANOCOBALAMIN) 1000 MCG tablet Take 2,000 mcg by mouth daily.    [provider]    Family History Family History  Problem Relation Age of Onset   Cancer Mother    Breast cancer Mother    Cancer Brother    Hypertension Brother    Breast cancer Maternal  Grandmother     Social History Social History   Tobacco Use   Smoking status: Some Days    Packs/day: 0.25    Years: 26.00    Additional pack years: 0.00    Total pack years: 6.50     Types: Cigarettes   Smokeless tobacco: Never  Vaping Use   Vaping Use: Never used  Substance Use Topics   Alcohol use: No   Drug use: No     Allergies   Leflunomide, Metformin, Advair diskus [fluticasone-salmeterol], Methocarbamol, and Other   Review of Systems Review of Systems   Physical Exam Triage Vital Signs ED Triage Vitals  Enc Vitals Group     BP 04/03/23 0813 139/84     Pulse Rate 04/03/23 0813 72     Resp 04/03/23 0813 16     Temp 04/03/23 0813 98.2 F (36.8 C)     Temp Source 04/03/23 0813 Oral     SpO2 04/03/23 0813 95 %     Weight --      Height --      Head Circumference --      Peak Flow --      Pain Score 04/03/23 0816 10     Pain Loc --      Pain Edu? --      Excl. in GC? --    No data found.  Updated Vital Signs BP 139/84 (BP Location: Right Arm)   Pulse 72   Temp 98.2 F (36.8 C) (Oral)   Resp 16   LMP 05/21/2013   SpO2 95%   Visual Acuity Right Eye Distance:   Left Eye Distance:   Bilateral Distance:    Right Eye Near:   Left Eye Near:    Bilateral Near:     Physical Exam   UC Treatments / Results  Labs (all labs ordered are listed, but only abnormal results are displayed) Labs Reviewed - No data to display  EKG   Radiology No results found.  Procedures Procedures (including critical care time)  Medications Ordered in UC Medications - No data to display  Initial Impression / Assessment and Plan / UC Course  I have reviewed the triage vital signs and the nursing notes.  Pertinent labs & imaging results that were available during my care of the patient were reviewed by me and considered in my medical decision making (see chart for details).  Laceration of left ear, acute left shoulder pain  Laceration of the earlobe extending to the cartilage therefore she has been advised to go to the nearest emergency department for immediate evaluation and closure, did reach out to plastics on-call to determine if this could be  completed in office, recommended emergency department evaluation, to escort self Final Clinical Impressions(s) / UC Diagnoses   Final diagnoses:  None   Discharge Instructions   None    ED Prescriptions   None    PDMP not reviewed this encounter.   Valinda Hoar, [place] 04/03/23 [phone]

## 2023-04-03 NOTE — ED Notes (Signed)
Pt fell on left side.  LAC on left ear; bleeding controlled.  Pt does have bruising noted on left side of chest/sternum and left upper arm.  Pt complains of left wrist pain as well.

## 2023-04-03 NOTE — ED Provider Notes (Signed)
[place] [facility] AT [facility] Provider Note   CSN: [id number] Arrival date & time: 04/03/23  0857     History  Chief Complaint  Patient presents with   Marletta Lor    Maria [name] is a 62 y.o. female past medical history significant for acid reflux, hypertension, fibromyalgia, CAD who presents with concern for mechanical fall, with question of whether or not there is any near syncope prior to stumbling.  Patient reports that she tripped in her bedroom, fell, and hit the left side of her head on a table.  She denies any loss of conscious.  She reports that she feels mildly nauseous at this time.  She reports some pain across the left side of the body, left side of the head, neck, shoulder, and left chest wall.  She denies any pain in the left leg, left hip.  She got Toradol at urgent care prior to arrival and reports that it did help some.   Fall       Home Medications Prior to Admission medications   Medication Sig Start Date End Date Taking? Authorizing Provider  cyclobenzaprine (FLEXERIL) 10 MG tablet Take 1 tablet (10 mg total) by mouth 2 (two) times daily as needed for muscle spasms. 04/03/23  Yes Markey Deady H, PA-C  oxyCODONE-acetaminophen (PERCOCET/ROXICET) 5-325 MG tablet Take 1 tablet by mouth every 6 (six) hours as needed for severe pain. 04/03/23  Yes Sonjia Wilcoxson H, PA-C  albuterol (VENTOLIN HFA) 108 (90 Base) MCG/ACT inhaler albuterol sulfate HFA 90 mcg/actuation aerosol inhaler  INHALE 1 TO 2 PUFFS INTO THE LUNGS EVERY 4 TO 6 HOURS FOR COUGH/WHEEZE    [provider]  albuterol (VENTOLIN HFA) 108 (90 Base) MCG/ACT inhaler SMARTSIG:1-2 Puff(s) Via Inhaler Every 4-6 Hours 01/17/22   [provider]  ALPRAZolam Prudy Feeler) 1 MG tablet Take 1 mg by mouth at bedtime as needed for sleep or anxiety.    [provider]  ALPRAZolam Prudy Feeler) 1 MG tablet 1 tablet 05/31/20   [provider]  ARIPiprazole  (ABILIFY) 5 MG tablet aripiprazole 5 mg tablet  TAKE 1 TABLET BY MOUTH IN THE MORNING ONCE DAILY    [provider]  ARNUITY ELLIPTA 100 MCG/ACT AEPB Inhale 1 puff into the lungs daily. 12/26/15   [provider]  Aspirin-Salicylamide-Caffeine (BC HEADACHE POWDER PO) Take by mouth.    [provider]  atenolol (TENORMIN) 25 MG tablet Take 25 mg by mouth daily.    [provider]  azelastine (ASTELIN) 0.1 % nasal spray Place into the nose.    [provider]  Azelastine HCl 0.15 % SOLN SPRAY TWICE INTO EACH NOSTRIL TWICE A DAY 07/05/16   [provider]  Azelastine HCl 0.15 % SOLN azelastine 0.15 % (205.5 mcg) nasal spray    [provider]  azithromycin (ZITHROMAX) 250 MG tablet azithromycin 250 mg tablet    [provider]  benzonatate (TESSALON) 100 MG capsule benzonatate 100 mg capsule  TAKE 1-2 CAPSULES 3 TIMES A DAY AS NEEDED FOR COUGH 04/30/20   [provider]  Blood Glucose Monitoring Suppl (TRUE METRIX METER) DEVI 1 each by Does not apply route 4 (four) times daily. Generic OK if available. Other brands OK based on price. 03/06/16   Leatha Gilding, MD  calcium elemental as carbonate (BARIATRIC TUMS ULTRA) 400 MG chewable tablet Chew 2 tablets by mouth at bedtime.    [provider]  carisoprodol (SOMA) 250 MG tablet Take 350  mg by mouth at bedtime.     [provider]  cefpodoxime (VANTIN) 200 MG tablet Take 200 mg by mouth daily. 02/23/22   [provider]  dexlansoprazole (DEXILANT) 60 MG capsule Dexilant 60 mg capsule, delayed release  TAKE ONE CAPSULE BY MOUTH EVERY DAY    [provider]  diclofenac (VOLTAREN) 75 MG EC tablet diclofenac sodium 75 mg tablet,delayed release  TAKE 1 TABLET BY MOUTH TWICE A DAY AS NEEDED FOR PAIN    [provider]  diltiazem (CARDIZEM CD) 360 MG 24 hr capsule Take 360 mg by mouth daily. 12/26/15   [provider]   diltiazem (CARDIZEM LA) 360 MG 24 hr tablet 1 tablet    [provider]  diltiazem (TIAZAC) 300 MG 24 hr capsule diltiazem CD 300 mg capsule,extended release 24 hr  TAKE ONE CAPSULE BY MOUTH EVERY DAY    [provider]  Dulaglutide (TRULICITY) 1.5 MG/0.5ML SOPN Trulicity 1.5 mg/0.5 mL subcutaneous pen injector  ADMINISTER 1.5 MG UNDER THE SKIN 1 TIME A WEEK 11/16/20   [provider]  esomeprazole (NEXIUM) 20 MG capsule Take 20 mg by mouth daily at 12 noon.    [provider]  estazolam (PROSOM) 2 MG tablet estazolam 2 mg tablet  TAKE 1 TABLET BY MOUTH EVERYDAY AT BEDTIME    [provider]  etodolac (LODINE) 400 MG tablet etodolac 400 mg tablet    [provider]  famotidine (PEPCID) 40 MG tablet 1 tablet at bedtime.    [provider]  fentaNYL (DURAGESIC) 12 MCG/HR fentanyl 12 mcg/hr transdermal patch    [provider]  fish oil-omega-3 fatty acids 1000 MG capsule Take 2 g by mouth daily.    [provider]  fluconazole (DIFLUCAN) 100 MG tablet fluconazole 100 mg tablet    [provider]  fluocinonide cream (LIDEX) 0.05 % fluocinonide 0.05 % topical cream  APPLY TO ARMS AFTER BATHING FOR 4 WEEKS    [provider]  fluticasone (FLONASE) 50 MCG/ACT nasal spray Place 2 sprays into the nose daily.    [provider]  fluticasone (FLONASE) 50 MCG/ACT nasal spray Place into the nose.    [provider]  Fluticasone Furoate 100 MCG/ACT AEPB Inhale into the lungs.    [provider]  gabapentin (NEURONTIN) 100 MG capsule Take 100 mg by mouth every evening. Reported on 03/03/2016    [provider]  gabapentin (NEURONTIN) 100 MG capsule gabapentin 100 mg capsule    [provider]  gabapentin (NEURONTIN) 300 MG capsule gabapentin 300 mg capsule  TAKE 1 CAPSULE BY MOUTH 4 TIMES A DAY    [provider]  glipiZIDE (GLUCOTROL) 5 MG tablet glipizide  5 mg tablet  TAKE 1 TABLET BY MOUTH TWICE DAILY    [provider]  glucose blood (TRUE METRIX BLOOD GLUCOSE TEST) test strip 4 times daily. Generic OK if available. Other brands OK based on price. 03/06/16   Leatha Gilding, MD  HYDROcodone-acetaminophen (NORCO/VICODIN) 5-325 MG tablet Take 1 tablet by mouth every 6 (six) hours as needed for moderate pain.    [provider]  hydrocortisone 2.5 % cream APPLY TO RASH AFTER BATHING 09/18/16   [provider]  hydrOXYzine (ATARAX) 25 MG tablet hydroxyzine HCl 25 mg tablet  TAKE 1 TABLET BY MOUTH AT NIGHT AS NEEDED FOR SLEEP    [provider]  levocetirizine (XYZAL) 5 MG tablet Take 5 mg by mouth every evening.  [provider]  lidocaine (XYLOCAINE) 5 % ointment Apply 1 application  topically as needed. 06/03/22   Candelaria Stagers, DPM  montelukast (SINGULAIR) 10 MG tablet Take 10 mg by mouth at bedtime.    [provider]  Multiple Vitamin (MULTIVITAMIN WITH MINERALS) TABS Take 1 tablet by mouth daily.    [provider]  promethazine (PHENERGAN) 25 MG suppository Place 1 suppository (25 mg total) rectally every 6 (six) hours as needed for nausea or vomiting. 12/30/20   Mesner, Barbara Cower, MD  promethazine (PHENERGAN) 25 MG tablet Take 1 tablet (25 mg total) by mouth every 6 (six) hours as needed for nausea or vomiting. 12/30/20   Mesner, Barbara Cower, MD  rosuvastatin (CRESTOR) 5 MG tablet Take 5 mg by mouth daily.    [provider]  sitaGLIPtin-metformin (JANUMET) 50-500 MG tablet Take 1 tablet by mouth 2 (two) times daily with a meal.     [provider]  traMADol (ULTRAM) 50 MG tablet Take 50 mg by mouth every 6 (six) hours as needed for moderate pain.     [provider]  traMADol (ULTRAM) 50 MG tablet Take 1 tablet (50 mg total) by mouth every 6 (six) hours as needed. 11/13/18   Emelia Loron, MD  triamterene-hydrochlorothiazide (DYAZIDE) 37.5-25 MG capsule Take 1  capsule by mouth daily.    [provider]  TRUEPLUS LANCETS 28G MISC 1 each by Does not apply route 4 (four) times daily. Generic OK if available. Other brands OK based on price. 03/06/16   Leatha Gilding, MD  Turmeric 500 MG CAPS Take by mouth.    [provider]  valACYclovir (VALTREX) 500 MG tablet TAKE 1 TABLET BY MOUTH TWICE A DAY FOR 3 DAYS 07/17/16   [provider]  vitamin B-12 (CYANOCOBALAMIN) 1000 MCG tablet Take 2,000 mcg by mouth daily.    [provider]      Allergies    Leflunomide, Metformin, Advair diskus [fluticasone-salmeterol], Methocarbamol, and Other    Review of Systems   Review of Systems  All other systems reviewed and are negative.   Physical Exam Updated Vital Signs BP (!) 154/90   Pulse 65   Temp 98.1 F (36.7 C) (Oral)   Resp 15   LMP 05/21/2013   SpO2 94%  Physical Exam Vitals and nursing note reviewed.  Constitutional:      General: She is not in acute distress.    Appearance: Normal appearance.  HENT:     Head: Normocephalic and atraumatic.     Comments:  laceration with bleeding controlled of the helix of the left ear, extending from the margin around to the posterior ear. Eyes:     General:        Right eye: No discharge.        Left eye: No discharge.  Cardiovascular:     Rate and Rhythm: Normal rate and regular rhythm.     Heart sounds: No murmur heard.    No friction rub. No gallop.  Pulmonary:     Effort: Pulmonary effort is normal.     Breath sounds: Normal breath sounds.  Abdominal:     General: Bowel sounds are normal.     Palpations: Abdomen is soft.  Musculoskeletal:     Comments: General tenderness without step-off, deformity of left shoulder, left clavicle, left cervical paraspinous muscles and left rib cage.  She has intact strength 5/5 bilateral upper and lower extremities.  She can cross arm adduction of the  left arm without difficulty.  Skin:    General: Skin is warm and dry.      Capillary Refill: Capillary refill takes less than 2 seconds.  Neurological:     Mental Status: She is alert and oriented to person, place, and time.  Psychiatric:        Mood and Affect: Mood normal.        Behavior: Behavior normal.     ED Results / Procedures / Treatments   Labs (all labs ordered are listed, but only abnormal results are displayed) Labs Reviewed  CBC - Abnormal; Notable for the following components:      Result Value   WBC 12.3 (*)    All other components within normal limits  BASIC METABOLIC PANEL - Abnormal; Notable for the following components:   Glucose, Bld 164 (*)    Creatinine, Ser 1.31 (*)    Calcium 8.7 (*)    GFR, Estimated 47 (*)    All other components within normal limits    EKG EKG Interpretation  Date/Time:  Monday April 03 2023 09:40:31 EDT Ventricular Rate:  67 PR Interval:  163 QRS Duration: 104 QT Interval:  392 QTC Calculation: 414 R Axis:   7 Text Interpretation: Sinus rhythm Minimal ST elevation, inferior leads since last tracing no significant change Confirmed by Rolan Bucco [phone]) on 04/03/2023 9:42:41 AM  Radiology CT Cervical Spine Wo Contrast  Result Date: 04/03/2023 CLINICAL DATA:  62 year old female status post fall, striking bedside table. Left ear and shoulder pain and bruising. EXAM: CT CERVICAL SPINE WITHOUT CONTRAST TECHNIQUE: Multidetector CT imaging of the cervical spine was performed without intravenous contrast. Multiplanar CT image reconstructions were also generated. RADIATION DOSE REDUCTION: This exam was performed according to the departmental dose-optimization program which includes automated exposure control, adjustment of the mA and/or kV according to patient size and/or use of iterative reconstruction technique. COMPARISON:  Head CT today. FINDINGS: Alignment: Mild straightening, reversal of cervical lordosis. Cervicothoracic junction alignment is within normal limits. Bilateral posterior element alignment is  within normal limits. Skull base and vertebrae: Bone mineralization is within normal limits. Visualized skull base is intact. No atlanto-occipital dissociation. C1 and C2 appear intact and aligned. No osseous abnormality identified. Soft tissues and spinal canal: Negative. Disc levels: Minimal lower cervical disc bulging and endplate spurring. Upper chest: Coronal series 9, image 46 also series 12, image 66 demonstrate mild asymmetric subcutaneous contusion above the left shoulder. Visible shoulder osseous structures appear intact. Negative visible lung apices and noncontrast superior mediastinum. IMPRESSION: 1. No acute traumatic injury identified in the cervical spine. 2. Left supraclavicular mild subcutaneous contusion. Electronically Signed   By: Odessa Fleming M.D.   On: 04/03/2023 10:51   CT Head Wo Contrast  Result Date: 04/03/2023 CLINICAL DATA:  62 year old female status post fall, striking bedside table. Left ear and shoulder pain and bruising. EXAM: CT HEAD WITHOUT CONTRAST TECHNIQUE: Contiguous axial images were obtained from the base of the skull through the vertex without intravenous contrast. RADIATION DOSE REDUCTION: This exam was performed according to the departmental dose-optimization program which includes automated exposure control, adjustment of the mA and/or kV according to patient size and/or use of iterative reconstruction technique. COMPARISON:  Cervical spine CT reported separately. FINDINGS: Brain: Normal cerebral volume. Small cavum septum pellucidum, normal variant. No midline shift, ventriculomegaly, mass effect, evidence of mass lesion, intracranial hemorrhage or evidence of cortically based acute infarction. Gray-white matter differentiation is within normal limits throughout the brain. Vascular: Calcified atherosclerosis at  the skull base. No suspicious intracranial vascular hyperdensity. Skull: No fracture identified. Sinuses/Orbits: Visualized paranasal sinuses and mastoids are well  aerated. Other: No orbit or scalp soft tissue injury identified. IMPRESSION: No acute traumatic injury identified. Normal for age noncontrast Head CT. Electronically Signed   By: Odessa Fleming M.D.   On: 04/03/2023 10:31   DG Wrist Complete Left  Result Date: 04/03/2023 CLINICAL DATA:  Fall.  Wrist pain EXAM: LEFT WRIST - COMPLETE 3+ VIEW COMPARISON:  None Available. FINDINGS: No distal radius or ulnar fracture. Radiocarpal joint is intact. No carpal fracture. No soft tissue abnormality. IMPRESSION: No fracture or dislocation. Electronically Signed   By: Genevive Bi M.D.   On: 04/03/2023 10:24   DG Shoulder Left  Result Date: 04/03/2023 CLINICAL DATA:  Fall. EXAM: LEFT SHOULDER - 2+ VIEW COMPARISON:  None Available. FINDINGS: There is no evidence of fracture or dislocation. There is no evidence of arthropathy or other focal bone abnormality. Soft tissues are unremarkable. IMPRESSION: Negative. Electronically Signed   By: Kennith Center M.D.   On: 04/03/2023 09:39   DG Chest 2 View  Result Date: 04/03/2023 CLINICAL DATA:  Fall. EXAM: CHEST - 2 VIEW COMPARISON:  02/13/2023 FINDINGS: The lungs are clear without focal pneumonia, edema, pneumothorax or pleural effusion. The cardiopericardial silhouette is within normal limits for size. No acute bony abnormality. IMPRESSION: No active cardiopulmonary disease. Electronically Signed   By: Kennith Center M.D.   On: 04/03/2023 09:38    Procedures .Marland KitchenLaceration Repair  Date/Time: 04/03/2023 11:23 AM  Performed by: Olene Floss, PA-C Authorized by: Olene Floss, PA-C   Consent:    Consent obtained:  Verbal   Consent given by:  Patient   Risks, benefits, and alternatives were discussed: yes     Risks discussed:  Infection, pain, need for additional repair, nerve damage, poor wound healing and poor cosmetic result   Alternatives discussed:  No treatment Universal protocol:    Procedure explained and questions answered to patient or proxy's  satisfaction: yes     Patient identity confirmed:  Verbally with patient Anesthesia:    Anesthesia method:  Local infiltration   Local anesthetic:  Lidocaine 2% WITH epi Laceration details:    Location:  Face   Facial location: helix of left ear.   Length (cm):  3   Depth (mm):  2 Exploration:    Imaging outcome: foreign body not noted     Wound exploration: wound explored through full range of motion and entire depth of wound visualized   Treatment:    Area cleansed with:  Povidone-iodine and saline   Amount of cleaning:  Standard Skin repair:    Repair method:  Sutures   Suture size:  4-0   Suture material:  Prolene   Suture technique:  Simple interrupted   Number of sutures:  4 Approximation:    Approximation:  Close Repair type:    Repair type:  Simple Post-procedure details:    Dressing:  Antibiotic ointment and non-adherent dressing   Procedure completion:  Tolerated     Medications Ordered in ED Medications  ondansetron (ZOFRAN-ODT) disintegrating tablet 4 mg (4 mg Oral Given 04/03/23 0936)  Tdap (BOOSTRIX) injection 0.5 mL (0.5 mLs Intramuscular Given 04/03/23 0934)  lidocaine-EPINEPHrine (XYLOCAINE W/EPI) 2 %-1:200000 (PF) injection 20 mL (20 mLs Infiltration Given 04/03/23 1005)  mupirocin cream (BACTROBAN) 2 % (1 Application Topical Given 04/03/23 1202)  oxyCODONE-acetaminophen (PERCOCET/ROXICET) 5-325 MG per tablet 1 tablet (1 tablet Oral Given 04/03/23 1142)  ED Course/ Medical Decision Making/ A&P                             Medical Decision Making Amount and/or Complexity of Data Reviewed Labs: ordered. Radiology: ordered.  Risk Prescription drug management.   This patient is a 62 y.o. female  who presents to the ED for concern of fall, laceration of ear, neck pain, left chest pain, shoulder pain, clavicle pain.   Differential diagnoses prior to evaluation: The emergent differential diagnosis includes, but is not limited to, fracture, dislocation versus  contusion, or other musculoskeletal injuries, laceration requiring repair, versus other. This is not an exhaustive differential.   Past Medical History / Co-morbidities: Fibromyalgia, renal insufficiency, GERD, hypertension  Physical Exam: Physical exam performed. The pertinent findings include: laceration with bleeding controlled of the helix of the left ear, extending from the margin around to the posterior ear, General tenderness without step-off, deformity of left shoulder, left clavicle, left cervical paraspinous muscles and left rib cage.  She has intact strength 5/5 bilateral upper and lower extremities.  She can cross arm adduction of the left arm without difficulty  Patient is somewhat hypertensive, blood pressure 154/90, vital signs otherwise stable  Lab Tests/Imaging studies: I personally interpreted labs/imaging and the pertinent results include: BMP notable for mild hyperglycemia, glucose 164, her creatinine is at 1.31 which is fairly close to her normal baseline.  CBC with mild leukocytosis likely secondary to trauma, white blood cells 12.3..  Independently interpreted CT cervical spine, head, plain film radiographs of the left wrist, left shoulder, and chest, no evidence of acute fracture, dislocation, there is a contusion about the clavicle, no evidence of intracranial injury.  I agree with the radiologist interpretation.  Cardiac monitoring: EKG obtained and interpreted by my attending physician which shows: Normal sinus rhythm   Medications: I ordered medication including Percocet x 1 for pain, Bactroban on her laceration, we updated her tetanus today.  I have reviewed the patients home medicines and have made adjustments as needed.   Disposition: After consideration of the diagnostic results and the patients response to treatment, I feel that patient with story it seems consistent with a mechanical, nonsyncopal fall, she does not have any lab work abnormalities to suggest some  additional cause for her to have fallen for another reason, she is not having any anterior chest pain, shortness of breath, heart palpitations, ongoing dizziness, she is able to ambulate without difficulty at time of discharge.  Laceration was repaired as described above without difficulty, she will return for suture removal in 7 to 10 days.   emergency department workup does not suggest an emergent condition requiring admission or immediate intervention beyond what has been performed at this time. The plan is: as above. The patient is safe for discharge and has been instructed to return immediately for worsening symptoms, change in symptoms or any other concerns.  Final Clinical Impression(s) / ED Diagnoses Final diagnoses:  Laceration of left ear, initial encounter  Fall, initial encounter  Contusion of left clavicle, initial encounter    Rx / DC Orders ED Discharge Orders          Ordered    oxyCODONE-acetaminophen (PERCOCET/ROXICET) 5-325 MG tablet  Every 6 hours PRN        04/03/23 1213    cyclobenzaprine (FLEXERIL) 10 MG tablet  2 times daily PRN        04/03/23 1213  [place]peri, Vansh Reckart H, PA-C 04/03/23 1238    Maia PlanLong, Joshua G, MD 04/05/23 1209

## 2023-04-03 NOTE — ED Triage Notes (Signed)
Patient here for evaluation of left ear laceration after she tripped in her bedroom and fell, hitting the left side of her head on a table. Denies LOC, is alert, oriented, ambulating independently with steady gait, and is in no apparent distress at this time.

## 2023-04-03 NOTE — Discharge Instructions (Signed)
Please use Tylenol for pain.  You may use 1000 mg of Tylenol every 6 hours.  Not to exceed 4 g of Tylenol within 24 hours.  You can use the muscle relaxant I am prescribing in addition to the above to help with any breakthrough pain.  You can take it up to twice daily.  It is safe to take at night, but I would be cautious taking it during the day as it can cause some drowsiness.  Make sure that you are feeling awake and alert before you get behind the wheel of a car or operate a motor vehicle.  It is not a narcotic pain medication so you are able to take it if it is not making you drowsy and still pilot a vehicle or machinery safely.  

## 2023-04-10 ENCOUNTER — Ambulatory Visit: Payer: BC Managed Care – PPO | Admitting: Podiatry

## 2023-04-12 DIAGNOSIS — S01312D Laceration without foreign body of left ear, subsequent encounter: Secondary | ICD-10-CM | POA: Diagnosis not present

## 2023-04-12 DIAGNOSIS — E1165 Type 2 diabetes mellitus with hyperglycemia: Secondary | ICD-10-CM | POA: Diagnosis not present

## 2023-04-12 DIAGNOSIS — I119 Hypertensive heart disease without heart failure: Secondary | ICD-10-CM | POA: Diagnosis not present

## 2023-04-12 DIAGNOSIS — N1831 Chronic kidney disease, stage 3a: Secondary | ICD-10-CM | POA: Diagnosis not present

## 2023-04-14 DIAGNOSIS — Z4802 Encounter for removal of sutures: Secondary | ICD-10-CM | POA: Diagnosis not present

## 2023-04-14 DIAGNOSIS — S01312D Laceration without foreign body of left ear, subsequent encounter: Secondary | ICD-10-CM | POA: Diagnosis not present

## 2023-04-18 DIAGNOSIS — E790 Hyperuricemia without signs of inflammatory arthritis and tophaceous disease: Secondary | ICD-10-CM | POA: Diagnosis not present

## 2023-04-18 DIAGNOSIS — M1990 Unspecified osteoarthritis, unspecified site: Secondary | ICD-10-CM | POA: Diagnosis not present

## 2023-04-18 DIAGNOSIS — M797 Fibromyalgia: Secondary | ICD-10-CM | POA: Diagnosis not present

## 2023-04-18 DIAGNOSIS — Z79899 Other long term (current) drug therapy: Secondary | ICD-10-CM | POA: Diagnosis not present

## 2023-04-19 ENCOUNTER — Ambulatory Visit
Admission: RE | Admit: 2023-04-19 | Discharge: 2023-04-19 | Disposition: A | Discharge status: Home / Self Care | Payer: BC Managed Care – PPO | Source: Ambulatory Visit | Attending: Obstetrics and Gynecology | Admitting: Obstetrics and Gynecology

## 2023-04-19 DIAGNOSIS — Z1231 Encounter for screening mammogram for malignant neoplasm of breast: Secondary | ICD-10-CM

## 2023-04-25 DIAGNOSIS — N1831 Chronic kidney disease, stage 3a: Secondary | ICD-10-CM | POA: Diagnosis not present

## 2023-04-25 DIAGNOSIS — E1165 Type 2 diabetes mellitus with hyperglycemia: Secondary | ICD-10-CM | POA: Diagnosis not present

## 2023-04-25 DIAGNOSIS — E782 Mixed hyperlipidemia: Secondary | ICD-10-CM | POA: Diagnosis not present

## 2023-04-25 DIAGNOSIS — I119 Hypertensive heart disease without heart failure: Secondary | ICD-10-CM | POA: Diagnosis not present

## 2023-04-25 DIAGNOSIS — M797 Fibromyalgia: Secondary | ICD-10-CM | POA: Diagnosis not present

## 2023-04-25 DIAGNOSIS — Z1159 Encounter for screening for other viral diseases: Secondary | ICD-10-CM | POA: Diagnosis not present

## 2023-04-25 DIAGNOSIS — Z7189 Other specified counseling: Secondary | ICD-10-CM | POA: Diagnosis not present

## 2023-05-09 DIAGNOSIS — Z6831 Body mass index (BMI) 31.0-31.9, adult: Secondary | ICD-10-CM | POA: Diagnosis not present

## 2023-05-09 DIAGNOSIS — R635 Abnormal weight gain: Secondary | ICD-10-CM | POA: Diagnosis not present

## 2023-05-09 DIAGNOSIS — Z01419 Encounter for gynecological examination (general) (routine) without abnormal findings: Secondary | ICD-10-CM | POA: Diagnosis not present

## 2023-05-09 DIAGNOSIS — Z1211 Encounter for screening for malignant neoplasm of colon: Secondary | ICD-10-CM | POA: Diagnosis not present

## 2023-05-26 DIAGNOSIS — L403 Pustulosis palmaris et plantaris: Secondary | ICD-10-CM | POA: Diagnosis not present

## 2023-05-30 ENCOUNTER — Encounter (INDEPENDENT_AMBULATORY_CARE_PROVIDER_SITE_OTHER): Payer: Self-pay | Admitting: Adult Health

## 2023-05-30 ENCOUNTER — Ambulatory Visit (INDEPENDENT_AMBULATORY_CARE_PROVIDER_SITE_OTHER): Payer: BC Managed Care – PPO | Admitting: Adult Health

## 2023-05-30 VITALS — BP 124/79 | HR 77 | Temp 98.1°F | Ht 64.0 in | Wt 191.0 lb

## 2023-05-30 DIAGNOSIS — N1831 Chronic kidney disease, stage 3a: Secondary | ICD-10-CM

## 2023-05-30 DIAGNOSIS — Z6832 Body mass index (BMI) 32.0-32.9, adult: Secondary | ICD-10-CM

## 2023-05-30 DIAGNOSIS — E669 Obesity, unspecified: Secondary | ICD-10-CM | POA: Diagnosis not present

## 2023-05-30 DIAGNOSIS — I10 Essential (primary) hypertension: Secondary | ICD-10-CM

## 2023-05-30 NOTE — Progress Notes (Signed)
Office: [phone]  /  Fax: [phone]   Initial Visit  Maria [name] was seen in clinic today to evaluate for obesity. She is interested in losing weight to improve overall health and reduce the risk of weight related complications. She presents today to review program treatment options, initial physical assessment, and evaluation.     She was referred by: PCP  When asked what else they would like to accomplish? She states: Adopt healthier eating patterns, Improve energy levels and physical activity, Improve existing medical conditions, Reduce number of medications, and Lose a target amount of weight : 160 lbs- focus on reducing central adiposity.  Weight history: Gaining weight after menopause age 63- currently age 62  When asked how has your weight affected you? She states: Having fatigue and Having poor endurance  Some associated conditions: Hypertension, Arthritis:RA, Hyperlipidemia, Diabetes, and Kidney disease  Contributing factors: Medications, Reduced physical activity, and Menopause  Weight promoting medications identified: Beta-blockers and Psychotropic medications  Current nutrition plan: Low-carb  Current level of physical activity: Walking and NEAT  Current or previous pharmacotherapy: GLP-1 and Metformin  Response to medication: Other: Diabetic- currently on Trulicity 1.5mg  once weekly, Glipizide 5mg  BID, PRN Touejeo   Past medical history includes:   Past Medical History:  Diagnosis Date   Anxiety    Asthma    Chronic kidney disease (CKD), stage III (moderate) (HCC)    Chronic lower back pain    Daily headache    DKA (diabetic ketoacidoses) admission 03/03/2016   Fibroid    Fibromyalgia    GERD (gastroesophageal reflux disease)    HTN (hypertension)    Pelvic pain in female    Pneumonia 05/2013   PONV (postoperative nausea and vomiting)    Renal insufficiency      Objective:   BP 124/79   Pulse 77   Temp 98.1 F (36.7 C)   Ht 5\' 4"   (1.626 m)   Wt 191 lb (86.6 kg)   LMP 05/21/2013   SpO2 97%   BMI 32.79 kg/m  She was weighed on the bioimpedance scale: Body mass index is 32.79 kg/m.  Peak Weight:191 lbs , Body Fat%:45.6, Visceral Fat Rating:12, Weight trend over the last 12 months: Increasing  General:  Alert, oriented and cooperative. Patient is in no acute distress.  Respiratory: Normal respiratory effort, no problems with respiration noted   Gait: able to ambulate independently  Mental Status: Normal mood and affect. Normal behavior. Normal judgment and thought content.   DIAGNOSTIC DATA REVIEWED:  BMET    Component Value Date/Time   NA 138 04/03/2023 1050   NA CANCELED 07/15/2022 1635   K 3.9 04/03/2023 1050   CL 108 04/03/2023 1050   CO2 22 04/03/2023 1050   GLUCOSE 164 (H) 04/03/2023 1050   BUN 18 04/03/2023 1050   BUN CANCELED 07/15/2022 1635   CREATININE 1.31 (H) 04/03/2023 1050   CALCIUM 8.7 (L) 04/03/2023 1050   GFRNONAA 47 (L) 04/03/2023 1050   GFRAA 59 (L) 11/07/2018 1000   Lab Results  Component Value Date   HGBA1C 13.4 (H) 03/04/2016   No results found for: "INSULIN" CBC    Component Value Date/Time   WBC 12.3 (H) 04/03/2023 0944   RBC 4.61 04/03/2023 0944   HGB 13.9 04/03/2023 0944   HGB 13.5 07/15/2022 1635   HCT 45.2 04/03/2023 0944   HCT 40.3 07/15/2022 1635   PLT 307 04/03/2023 0944   PLT 246 07/15/2022 1635   MCV 98.0 04/03/2023 0944  MCV 92 07/15/2022 1635   MCH 30.2 04/03/2023 0944   MCHC 30.8 04/03/2023 0944   RDW 15.1 04/03/2023 0944   RDW 13.3 07/15/2022 1635   Iron/TIBC/Ferritin/ %Sat No results found for: "IRON", "TIBC", "FERRITIN", "IRONPCTSAT" Lipid Panel  No results found for: "CHOL", "TRIG", "HDL", "CHOLHDL", "VLDL", "LDLCALC", "LDLDIRECT" Hepatic Function Panel     Component Value Date/Time   PROT CANCELED 07/15/2022 1635   ALBUMIN CANCELED 07/15/2022 1635   AST CANCELED 07/15/2022 1635   ALT CANCELED 07/15/2022 1635   ALKPHOS CANCELED 07/15/2022  1635   BILITOT CANCELED 07/15/2022 1635      Component Value Date/Time   TSH 1.612 03/03/2016 2000     Assessment and Plan:   Hypertension, unspecified type  Stage 3a chronic kidney disease (HCC)  Obesity (BMI 30-39.9), Starting BMI 32.8   Establish with [facility]     Obesity Treatment / Action Plan:  Patient will work on garnering support from family and friends to begin weight loss journey. Will work on eliminating or reducing the presence of highly palatable, calorie dense foods in the home. Will complete provided nutritional and psychosocial assessment questionnaire before the next appointment. Will be scheduled for indirect calorimetry to determine resting energy expenditure in a fasting state.  This will allow [place] to create a reduced calorie, high-protein meal plan to promote loss of fat mass while preserving muscle mass. Counseled on the health benefits of losing 5%-15% of total body weight. Was counseled on nutritional approaches to weight loss and benefits of reducing processed foods and consuming plant-based foods and high quality protein as part of nutritional weight management. Was counseled on pharmacotherapy and role as an adjunct in weight management.   Obesity Education Performed Today:  She was weighed on the bioimpedance scale and results were discussed and documented in the synopsis.  We discussed obesity as a disease and the importance of a more detailed evaluation of all the factors contributing to the disease.  We discussed the importance of long term lifestyle changes which include nutrition, exercise and behavioral modifications as well as the importance of customizing this to her specific health and social needs.  We discussed the benefits of reaching a healthier weight to alleviate the symptoms of existing conditions and reduce the risks of the biomechanical, metabolic and psychological effects of obesity.  Maria [name] appears to be in the  action stage of change and states they are ready to start intensive lifestyle modifications and behavioral modifications.  30 minutes was spent today on this visit including the above counseling, pre-visit chart review, and post-visit documentation.  Reviewed by clinician on day of visit: allergies, medications, problem list, medical history, surgical history, family history, social history, and previous encounter notes pertinent to obesity diagnosis.   Victorine Mcnee d. Whitlee Sluder, NP-C

## 2023-05-30 NOTE — Addendum Note (Signed)
Addended by: Verlon Setting on: 05/30/2023 04:23 PM   Modules accepted: Orders

## 2023-06-06 DIAGNOSIS — M797 Fibromyalgia: Secondary | ICD-10-CM | POA: Diagnosis not present

## 2023-06-06 DIAGNOSIS — E1165 Type 2 diabetes mellitus with hyperglycemia: Secondary | ICD-10-CM | POA: Diagnosis not present

## 2023-06-06 DIAGNOSIS — I119 Hypertensive heart disease without heart failure: Secondary | ICD-10-CM | POA: Diagnosis not present

## 2023-06-06 DIAGNOSIS — N1831 Chronic kidney disease, stage 3a: Secondary | ICD-10-CM | POA: Diagnosis not present

## 2023-06-06 DIAGNOSIS — Z7189 Other specified counseling: Secondary | ICD-10-CM | POA: Diagnosis not present

## 2023-06-28 DIAGNOSIS — J4530 Mild persistent asthma, uncomplicated: Secondary | ICD-10-CM | POA: Diagnosis not present

## 2023-06-28 DIAGNOSIS — F17200 Nicotine dependence, unspecified, uncomplicated: Secondary | ICD-10-CM | POA: Diagnosis not present

## 2023-06-28 DIAGNOSIS — J3089 Other allergic rhinitis: Secondary | ICD-10-CM | POA: Diagnosis not present

## 2023-07-18 DIAGNOSIS — Z79899 Other long term (current) drug therapy: Secondary | ICD-10-CM | POA: Diagnosis not present

## 2023-07-18 DIAGNOSIS — L408 Other psoriasis: Secondary | ICD-10-CM | POA: Diagnosis not present

## 2023-07-18 DIAGNOSIS — M1990 Unspecified osteoarthritis, unspecified site: Secondary | ICD-10-CM | POA: Diagnosis not present

## 2023-07-18 DIAGNOSIS — L4050 Arthropathic psoriasis, unspecified: Secondary | ICD-10-CM | POA: Diagnosis not present

## 2023-07-18 DIAGNOSIS — E790 Hyperuricemia without signs of inflammatory arthritis and tophaceous disease: Secondary | ICD-10-CM | POA: Diagnosis not present

## 2023-08-01 DIAGNOSIS — B3789 Other sites of candidiasis: Secondary | ICD-10-CM | POA: Diagnosis not present

## 2023-08-01 DIAGNOSIS — R748 Abnormal levels of other serum enzymes: Secondary | ICD-10-CM | POA: Diagnosis not present

## 2023-08-01 DIAGNOSIS — N1831 Chronic kidney disease, stage 3a: Secondary | ICD-10-CM | POA: Diagnosis not present

## 2023-08-01 DIAGNOSIS — I119 Hypertensive heart disease without heart failure: Secondary | ICD-10-CM | POA: Diagnosis not present

## 2023-08-01 DIAGNOSIS — E1165 Type 2 diabetes mellitus with hyperglycemia: Secondary | ICD-10-CM | POA: Diagnosis not present

## 2023-08-01 DIAGNOSIS — E790 Hyperuricemia without signs of inflammatory arthritis and tophaceous disease: Secondary | ICD-10-CM | POA: Diagnosis not present

## 2023-08-02 DIAGNOSIS — K219 Gastro-esophageal reflux disease without esophagitis: Secondary | ICD-10-CM | POA: Diagnosis not present

## 2023-08-02 DIAGNOSIS — K5900 Constipation, unspecified: Secondary | ICD-10-CM | POA: Diagnosis not present

## 2023-08-02 DIAGNOSIS — Z1211 Encounter for screening for malignant neoplasm of colon: Secondary | ICD-10-CM | POA: Diagnosis not present

## 2023-08-02 DIAGNOSIS — R140 Abdominal distension (gaseous): Secondary | ICD-10-CM | POA: Diagnosis not present

## 2023-08-16 DIAGNOSIS — R140 Abdominal distension (gaseous): Secondary | ICD-10-CM | POA: Diagnosis not present

## 2023-08-16 DIAGNOSIS — K5730 Diverticulosis of large intestine without perforation or abscess without bleeding: Secondary | ICD-10-CM | POA: Diagnosis not present

## 2023-08-16 DIAGNOSIS — K5140 Inflammatory polyps of colon without complications: Secondary | ICD-10-CM | POA: Diagnosis not present

## 2023-08-16 DIAGNOSIS — K635 Polyp of colon: Secondary | ICD-10-CM | POA: Diagnosis not present

## 2023-08-16 DIAGNOSIS — K6389 Other specified diseases of intestine: Secondary | ICD-10-CM | POA: Diagnosis not present

## 2023-08-16 DIAGNOSIS — Z1211 Encounter for screening for malignant neoplasm of colon: Secondary | ICD-10-CM | POA: Diagnosis not present

## 2023-08-16 DIAGNOSIS — K5900 Constipation, unspecified: Secondary | ICD-10-CM | POA: Diagnosis not present

## 2023-08-29 ENCOUNTER — Other Ambulatory Visit: Payer: Self-pay

## 2023-08-29 ENCOUNTER — Emergency Department (HOSPITAL_BASED_OUTPATIENT_CLINIC_OR_DEPARTMENT_OTHER): Payer: BC Managed Care – PPO

## 2023-08-29 ENCOUNTER — Encounter (HOSPITAL_BASED_OUTPATIENT_CLINIC_OR_DEPARTMENT_OTHER): Payer: Self-pay

## 2023-08-29 ENCOUNTER — Emergency Department (HOSPITAL_BASED_OUTPATIENT_CLINIC_OR_DEPARTMENT_OTHER)
Admission: EM | Admit: 2023-08-29 | Discharge: 2023-08-29 | Disposition: A | Discharge status: Home / Self Care | Payer: BC Managed Care – PPO | Attending: Emergency Medicine | Admitting: Emergency Medicine

## 2023-08-29 DIAGNOSIS — N189 Chronic kidney disease, unspecified: Secondary | ICD-10-CM | POA: Diagnosis not present

## 2023-08-29 DIAGNOSIS — J45909 Unspecified asthma, uncomplicated: Secondary | ICD-10-CM | POA: Diagnosis not present

## 2023-08-29 DIAGNOSIS — M25551 Pain in right hip: Secondary | ICD-10-CM | POA: Insufficient documentation

## 2023-08-29 DIAGNOSIS — K4090 Unilateral inguinal hernia, without obstruction or gangrene, not specified as recurrent: Secondary | ICD-10-CM | POA: Diagnosis not present

## 2023-08-29 DIAGNOSIS — I129 Hypertensive chronic kidney disease with stage 1 through stage 4 chronic kidney disease, or unspecified chronic kidney disease: Secondary | ICD-10-CM | POA: Insufficient documentation

## 2023-08-29 DIAGNOSIS — E1122 Type 2 diabetes mellitus with diabetic chronic kidney disease: Secondary | ICD-10-CM | POA: Insufficient documentation

## 2023-08-29 DIAGNOSIS — D72829 Elevated white blood cell count, unspecified: Secondary | ICD-10-CM | POA: Diagnosis not present

## 2023-08-29 DIAGNOSIS — Z7984 Long term (current) use of oral hypoglycemic drugs: Secondary | ICD-10-CM | POA: Insufficient documentation

## 2023-08-29 DIAGNOSIS — K429 Umbilical hernia without obstruction or gangrene: Secondary | ICD-10-CM | POA: Diagnosis not present

## 2023-08-29 DIAGNOSIS — Z79899 Other long term (current) drug therapy: Secondary | ICD-10-CM | POA: Insufficient documentation

## 2023-08-29 DIAGNOSIS — K5730 Diverticulosis of large intestine without perforation or abscess without bleeding: Secondary | ICD-10-CM | POA: Diagnosis not present

## 2023-08-29 DIAGNOSIS — D259 Leiomyoma of uterus, unspecified: Secondary | ICD-10-CM | POA: Diagnosis not present

## 2023-08-29 LAB — COMPREHENSIVE METABOLIC PANEL
ALT: 16 U/L (ref 0–44)
AST: 11 U/L — ABNORMAL LOW (ref 15–41)
Albumin: 3.9 g/dL (ref 3.5–5.0)
Alkaline Phosphatase: 99 U/L (ref 38–126)
Anion gap: 12 (ref 5–15)
BUN: 31 mg/dL — ABNORMAL HIGH (ref 6–20)
CO2: 21 mmol/L — ABNORMAL LOW (ref 22–32)
Calcium: 8.6 mg/dL — ABNORMAL LOW (ref 8.9–10.3)
Chloride: 104 mmol/L (ref 98–111)
Creatinine, Ser: 1.67 mg/dL — ABNORMAL HIGH (ref 0.44–1.00)
GFR, Estimated: 35 mL/min — ABNORMAL LOW (ref >60)
Glucose, Bld: 224 mg/dL — ABNORMAL HIGH (ref 70–99)
Potassium: 4.1 mmol/L (ref 3.5–5.1)
Sodium: 137 mmol/L (ref 135–145)
Total Bilirubin: 0.2 mg/dL — ABNORMAL LOW (ref 0.3–1.2)
Total Protein: 7.6 g/dL (ref 6.5–8.1)

## 2023-08-29 LAB — CBC WITH DIFFERENTIAL/PLATELET
Abs Immature Granulocytes: 0.06 10*3/uL (ref 0.00–0.07)
Basophils Absolute: 0.1 10*3/uL (ref 0.0–0.1)
Basophils Relative: 1 %
Eosinophils Absolute: 0.1 10*3/uL (ref 0.0–0.5)
Eosinophils Relative: 1 %
HCT: 40.4 % (ref 36.0–46.0)
Hemoglobin: 13.1 g/dL (ref 12.0–15.0)
Immature Granulocytes: 0 %
Lymphocytes Relative: 34 %
Lymphs Abs: 5 10*3/uL — ABNORMAL HIGH (ref 0.7–4.0)
MCH: 28.9 pg (ref 26.0–34.0)
MCHC: 32.4 g/dL (ref 30.0–36.0)
MCV: 89 fL (ref 80.0–100.0)
Monocytes Absolute: 0.8 10*3/uL (ref 0.1–1.0)
Monocytes Relative: 6 %
Neutro Abs: 8.6 10*3/uL — ABNORMAL HIGH (ref 1.7–7.7)
Neutrophils Relative %: 58 %
Platelets: 380 10*3/uL (ref 150–400)
RBC: 4.54 MIL/uL (ref 3.87–5.11)
RDW: 13.6 % (ref 11.5–15.5)
WBC: 14.6 10*3/uL — ABNORMAL HIGH (ref 4.0–10.5)
nRBC: 0 % (ref 0.0–0.2)

## 2023-08-29 MED ORDER — LIDOCAINE 5 % EX PTCH
2.0000 | MEDICATED_PATCH | Freq: Once | CUTANEOUS | Status: DC
  Administered 2023-08-29: 2 via TRANSDERMAL
  Filled 2023-08-29: qty 2

## 2023-08-29 MED ORDER — HYDROCODONE-ACETAMINOPHEN 5-325 MG PO TABS: 2.0000 | ORAL_TABLET | ORAL | 0 refills | Status: DC | PRN

## 2023-08-29 NOTE — Discharge Instructions (Signed)
You were seen in the emergency department for leg pain and swelling.  As we discussed I think that your symptoms are likely coming from the joints in your hip, both your right hip, and your right sacroiliac joint.  We did not find any other emergent cause for your pain today.  We have given you Lidoderm patches.  I tried to send a prescription of this to your pharmacy, but it came back that it was requiring a prior authorization.  You can however get these over-the-counter, I recommend buying and trying these at home.  I have also sent a short course of pain medication to your pharmacy.  Please follow-up with your rheumatologist.  Continue to monitor how you're doing and return to the ER for new or worsening symptoms.

## 2023-08-29 NOTE — ED Triage Notes (Signed)
Pt arrives ambulatory to ED from PCP for swelling to right upper leg and hip. Pt reports it started last Thursday. Also reports colonoscopy on the 21st. Denies any known injury.

## 2023-08-29 NOTE — ED Provider Notes (Signed)
[place] EMERGENCY DEPARTMENT AT [facility] Provider Note   CSN: [id number] Arrival date & time: 08/29/23  1340     History  Chief Complaint  Patient presents with   Leg Swelling    Maria [name] is a 62 y.o. female with history of chronic kidney disease, asthma, anxiety, chronic low back pain s/p lumbar fusion, diabetes, fibromyalgia, hypertension who presents the emergency department complaining of right hip pain.  Patient states this has been going on for a few days, but got significantly worse today.  She was looking in the mirror and felt as though her right upper leg was swollen.  She called her primary doctor's office, and the triage nurse recommended that she come to the ER for evaluation.  She does have history of sciatica and neuropathy, but today is not having any shooting pain down the leg, or any worsening of her neuropathy in that affected side.  No known injury.  She had some leftover oxycodone from an injury earlier this year that she tried taking a tablet of and did not have any relief. She had taken tylenol earlier in the day as well.   HPI     Home Medications Prior to Admission medications   Medication Sig Start Date End Date Taking? Authorizing Provider  HYDROcodone-acetaminophen (NORCO/VICODIN) 5-325 MG tablet Take 2 tablets by mouth every 4 (four) hours as needed. 08/29/23  Yes Wendelin Reader T, PA-C  albuterol (VENTOLIN HFA) 108 (90 Base) MCG/ACT inhaler albuterol sulfate HFA 90 mcg/actuation aerosol inhaler  INHALE 1 TO 2 PUFFS INTO THE LUNGS EVERY 4 TO 6 HOURS FOR COUGH/WHEEZE    [provider]  ALPRAZolam (XANAX) 1 MG tablet Take 1 mg by mouth at bedtime as needed for sleep or anxiety.    [provider]  ARNUITY ELLIPTA 100 MCG/ACT AEPB Inhale 1 puff into the lungs daily. 12/26/15   [provider]  Blood Glucose Monitoring Suppl (TRUE METRIX METER) DEVI 1 each by Does not apply route 4 (four) times daily.  Generic OK if available. Other brands OK based on price. 03/06/16   Leatha Gilding, MD  carisoprodol (SOMA) 250 MG tablet Take 350 mg by mouth at bedtime.     [provider]  diltiazem (CARDIZEM LA) 360 MG 24 hr tablet 1 tablet    [provider]  Dulaglutide (TRULICITY) 1.5 MG/0.5ML SOPN Trulicity 1.5 mg/0.5 mL subcutaneous pen injector  ADMINISTER 1.5 MG UNDER THE SKIN 1 TIME A WEEK 11/16/20   [provider]  esomeprazole (NEXIUM) 20 MG capsule Take 20 mg by mouth daily at 12 noon.    [provider]  famotidine (PEPCID) 40 MG tablet 1 tablet at bedtime.    [provider]  Fluticasone Furoate 100 MCG/ACT AEPB Inhale into the lungs.    [provider]  gabapentin (NEURONTIN) 300 MG capsule gabapentin 300 mg capsule  TAKE 1 CAPSULE BY MOUTH 4 TIMES A DAY    [provider]  glipiZIDE (GLUCOTROL) 5 MG tablet glipizide 5 mg tablet  TAKE 1 TABLET BY MOUTH TWICE DAILY    [provider]  glucose blood (TRUE METRIX BLOOD GLUCOSE TEST) test strip 4 times daily. Generic OK if available. Other brands OK based on price. 03/06/16   Leatha Gilding, MD  montelukast (SINGULAIR) 10 MG tablet Take 10 mg by mouth at bedtime.    [provider]  Multiple Vitamin (MULTIVITAMIN WITH MINERALS) TABS Take 1 tablet by mouth daily.  [provider]  rosuvastatin (CRESTOR) 5 MG tablet Take 5 mg by mouth daily.    [provider]  triamterene-hydrochlorothiazide (DYAZIDE) 37.5-25 MG capsule Take 1 capsule by mouth daily.    [provider]      Allergies    Leflunomide, Metformin, Advair diskus [fluticasone-salmeterol], Methocarbamol, and Other    Review of Systems   Review of Systems  Musculoskeletal:  Positive for arthralgias.  All other systems reviewed and are negative.   Physical Exam Updated Vital Signs BP 137/80 (BP Location: Right Arm)   Pulse (!) 57   Temp 98.4 F (36.9 C) (Oral)    Resp 20   Ht 5\' 5"  (1.651 m)   Wt 86.2 kg   LMP 05/21/2013   SpO2 99%   BMI 31.62 kg/m  Physical Exam Vitals and nursing note reviewed.  Constitutional:      Appearance: Normal appearance.  HENT:     Head: Normocephalic and atraumatic.  Eyes:     Conjunctiva/sclera: Conjunctivae normal.  Pulmonary:     Effort: Pulmonary effort is normal. No respiratory distress.  Musculoskeletal:     Comments: Point tenderness over the right lateral hip and right SI joint, with some associated swelling, no other overlying skin changes. No midline spinal tenderness. Compartments of the right leg are soft without appreciable swelling of the thigh or calf. No tenderness to those areas.   Skin:    General: Skin is warm and dry.  Neurological:     Mental Status: She is alert.     Comments: Normal sensation in bilateral lower legs  Psychiatric:        Mood and Affect: Mood normal.        Behavior: Behavior normal.     ED Results / Procedures / Treatments   Labs (all labs ordered are listed, but only abnormal results are displayed) Labs Reviewed  COMPREHENSIVE METABOLIC PANEL - Abnormal; Notable for the following components:      Result Value   CO2 21 (*)    Glucose, Bld 224 (*)    BUN 31 (*)    Creatinine, Ser 1.67 (*)    Calcium 8.6 (*)    AST 11 (*)    Total Bilirubin 0.2 (*)    GFR, Estimated 35 (*)    All other components within normal limits  CBC WITH DIFFERENTIAL/PLATELET - Abnormal; Notable for the following components:   WBC 14.6 (*)    Neutro Abs 8.6 (*)    Lymphs Abs 5.0 (*)    All other components within normal limits    EKG None  Radiology CT PELVIS WO CONTRAST  Result Date: 08/29/2023 CLINICAL DATA:  Right thigh and hip swelling and pain x5 days. Most recent medical procedure was colonoscopy 08/16/2023. History of stage 3 chronic kidney disease. EXAM: CT PELVIS WITHOUT CONTRAST TECHNIQUE: Multidetector CT imaging of the pelvis was performed following the standard  protocol without intravenous contrast. RADIATION DOSE REDUCTION: This exam was performed according to the departmental dose-optimization program which includes automated exposure control, adjustment of the mA and/or kV according to patient size and/or use of iterative reconstruction technique. COMPARISON:  CT abdomen and pelvis with IV contrast 08/04/2011. FINDINGS: Urinary Tract: The ureters are small caliber and insert low in the pelvis compatible with pelvic floor laxity. There is no cystocele. There is mild bladder thickening versus underdistention. Correlate clinically for cystitis. No masslike bladder thickening is seen. Bowel: No dilatation or wall thickening including the appendix. There is sigmoid diverticulosis  but no evidence of acute diverticulitis. Vascular/Lymphatic: Minimal calcific plaques left common iliac and left internal iliac arteries. The distal aorta is unremarkable. No other calcific plaques are seen. No evidence of aneurysms. No pelvic or inguinal adenopathy is evident. Reproductive: Uterus is considerably smaller than previously. The patient may have undergone interval supracervical hysterectomy. There are a few calcified and noncalcified fibroids, largest is 2.8 cm. The ovaries are symmetric in size and no adnexal masses seen. Other: Small umbilical and right inguinal fat hernias. No incarcerated hernias. No free fluid, free hemorrhage or free air, or focal inflammatory process is seen. Musculoskeletal: No visible asymmetric edema in the upper thighs. Area musculature is normal in bulk and normal in noncontrast attenuation. There are no visible bursal fluid collections. Symmetric early changes of degenerative arthrosis involving both hips, with mild spurring of the SI joints. There has been interval L5-S1 dorsal fusion rod and pedicle screw fusion with an interbody metallic cage graft in the right side of the disc space. Solid fusion is seen over the right side of the disc space but  otherwise the disc space appears to remain patent. There is solid fusion of the facet joints bilaterally. No acute or suspicious osseous abnormality is seen, no focal bone lesions. There is no visible fluid in either hip joint. IMPRESSION: 1. No visible asymmetric edema or fluid collection in the upper thighs. 2. Early degenerative changes of both hips.  No joint effusions. 3. Pelvic floor laxity with normal caliber ureters inserting low in the pelvis. No cystocele. 4. Bladder thickening versus underdistention. Correlate clinically for cystitis. 5. Diverticulosis without evidence of diverticulitis. 6. Small umbilical and right inguinal fat hernias. No incarcerated hernia. 7. Interval L5-S1 fusion since 2012. 8. Fibroid uterus, much smaller than previously. Electronically Signed   By: Almira Bar M.D.   On: 08/29/2023 20:28    Procedures Procedures    Medications Ordered in ED Medications  lidocaine (LIDODERM) 5 % 2 patch (has no administration in time range)    ED Course/ Medical Decision Making/ A&P                                 Medical Decision Making Amount and/or Complexity of Data Reviewed Labs: ordered. Radiology: ordered.  This patient is a 62 y.o. female  who presents to the ED for concern of right hip pain.   Differential diagnoses prior to evaluation: The emergent differential diagnosis includes, but is not limited to,  bursitis, sacroiliitis, DVT, appendicitis, ovarian torsion, kidney stone. This is not an exhaustive differential.   Past Medical History / Co-morbidities / Social History: chronic kidney disease, asthma, anxiety, chronic low back pain s/p lumbar fusion, diabetes, fibromyalgia, hypertension   Physical Exam: Physical exam performed. The pertinent findings include: Point tenderness over the right lateral hip and right SI joint, with some associated swelling. Concerning for right hip and right SI bursitis  Lab Tests/Imaging studies: I personally interpreted  labs/imaging and the pertinent results include: Leukocytosis of 14.6, normal hemoglobin.  CMP with glucose 224, BUN 31, creatinine 1.67, GFR 35, consistent with patient's diagnosis of chronic kidney disease.  CT pelvis with early degenerative changes of both hips, no visible asymmetric edema or fluid collections. I agree with the radiologist interpretation.  Medications: Offered pain medication prior to imaging and patient declined. Did give lidocaine patch. I have reviewed the patients home medicines and have made adjustments as needed.   Disposition: After  consideration of the diagnostic results and the patients response to treatment, I feel that emergency department workup does not suggest an emergent condition requiring admission or immediate intervention beyond what has been performed at this time. The plan is: discharge to home with symptomatic treatment of right leg and hip pain, possibly related to arthritis. Exam was concerning for bursitis, with no obvious fluid collections on CT. Will prescribe short course of pain medication and recommend purchasing lidoderm patches over the counter (prescription 5% required prior authorization). Initially recommended follow up with orthopedics, but patient would like to follow up with rheumatology which I think is reasonable.  The patient is safe for discharge and has been instructed to return immediately for worsening symptoms, change in symptoms or any other concerns.  Final Clinical Impression(s) / ED Diagnoses Final diagnoses:  Posterior pain of hip, right  Lateral pain of right hip    Rx / DC Orders ED Discharge Orders          Ordered    HYDROcodone-acetaminophen (NORCO/VICODIN) 5-325 MG tablet  Every 4 hours PRN        08/29/23 2053           Portions of this report may have been transcribed using voice recognition software. Every effort was made to ensure accuracy; however, inadvertent computerized transcription errors may be  present.    Bethan Adamek T, PA-C 08/29/23 2100    Elayne Snare K, DO 08/29/23 2337

## 2023-09-09 DIAGNOSIS — W57XXXA Bitten or stung by nonvenomous insect and other nonvenomous arthropods, initial encounter: Secondary | ICD-10-CM | POA: Diagnosis not present

## 2023-09-09 DIAGNOSIS — L03112 Cellulitis of left axilla: Secondary | ICD-10-CM | POA: Diagnosis not present

## 2023-09-09 DIAGNOSIS — S40862A Insect bite (nonvenomous) of left upper arm, initial encounter: Secondary | ICD-10-CM | POA: Diagnosis not present

## 2023-09-14 DIAGNOSIS — M05649 Rheumatoid arthritis of unspecified hand with involvement of other organs and systems: Secondary | ICD-10-CM | POA: Diagnosis not present

## 2023-09-14 DIAGNOSIS — N1832 Chronic kidney disease, stage 3b: Secondary | ICD-10-CM | POA: Diagnosis not present

## 2023-09-14 DIAGNOSIS — L403 Pustulosis palmaris et plantaris: Secondary | ICD-10-CM | POA: Diagnosis not present

## 2023-09-14 DIAGNOSIS — E1165 Type 2 diabetes mellitus with hyperglycemia: Secondary | ICD-10-CM | POA: Diagnosis not present

## 2023-09-14 DIAGNOSIS — N1831 Chronic kidney disease, stage 3a: Secondary | ICD-10-CM | POA: Diagnosis not present

## 2023-09-14 DIAGNOSIS — I119 Hypertensive heart disease without heart failure: Secondary | ICD-10-CM | POA: Diagnosis not present

## 2023-09-14 DIAGNOSIS — B029 Zoster without complications: Secondary | ICD-10-CM | POA: Diagnosis not present

## 2023-10-06 DIAGNOSIS — E1165 Type 2 diabetes mellitus with hyperglycemia: Secondary | ICD-10-CM | POA: Diagnosis not present

## 2023-10-06 DIAGNOSIS — I119 Hypertensive heart disease without heart failure: Secondary | ICD-10-CM | POA: Diagnosis not present

## 2023-10-06 DIAGNOSIS — M05649 Rheumatoid arthritis of unspecified hand with involvement of other organs and systems: Secondary | ICD-10-CM | POA: Diagnosis not present

## 2023-10-06 DIAGNOSIS — N1832 Chronic kidney disease, stage 3b: Secondary | ICD-10-CM | POA: Diagnosis not present

## 2023-10-10 DIAGNOSIS — H35033 Hypertensive retinopathy, bilateral: Secondary | ICD-10-CM | POA: Diagnosis not present

## 2023-10-11 DIAGNOSIS — Z79899 Other long term (current) drug therapy: Secondary | ICD-10-CM | POA: Diagnosis not present

## 2023-10-11 DIAGNOSIS — L4050 Arthropathic psoriasis, unspecified: Secondary | ICD-10-CM | POA: Diagnosis not present

## 2023-10-11 DIAGNOSIS — M1990 Unspecified osteoarthritis, unspecified site: Secondary | ICD-10-CM | POA: Diagnosis not present

## 2023-10-11 DIAGNOSIS — E790 Hyperuricemia without signs of inflammatory arthritis and tophaceous disease: Secondary | ICD-10-CM | POA: Diagnosis not present

## 2023-10-11 DIAGNOSIS — L408 Other psoriasis: Secondary | ICD-10-CM | POA: Diagnosis not present

## 2023-11-17 DIAGNOSIS — I119 Hypertensive heart disease without heart failure: Secondary | ICD-10-CM | POA: Diagnosis not present

## 2023-11-17 DIAGNOSIS — K2100 Gastro-esophageal reflux disease with esophagitis, without bleeding: Secondary | ICD-10-CM | POA: Diagnosis not present

## 2023-11-17 DIAGNOSIS — E1165 Type 2 diabetes mellitus with hyperglycemia: Secondary | ICD-10-CM | POA: Diagnosis not present

## 2023-11-17 DIAGNOSIS — N1832 Chronic kidney disease, stage 3b: Secondary | ICD-10-CM | POA: Diagnosis not present

## 2023-11-17 DIAGNOSIS — B028 Zoster with other complications: Secondary | ICD-10-CM | POA: Diagnosis not present

## 2023-12-27 DIAGNOSIS — R6889 Other general symptoms and signs: Secondary | ICD-10-CM | POA: Diagnosis not present

## 2023-12-29 DIAGNOSIS — N1832 Chronic kidney disease, stage 3b: Secondary | ICD-10-CM | POA: Diagnosis not present

## 2023-12-29 DIAGNOSIS — K2100 Gastro-esophageal reflux disease with esophagitis, without bleeding: Secondary | ICD-10-CM | POA: Diagnosis not present

## 2023-12-29 DIAGNOSIS — I119 Hypertensive heart disease without heart failure: Secondary | ICD-10-CM | POA: Diagnosis not present

## 2023-12-29 DIAGNOSIS — E1165 Type 2 diabetes mellitus with hyperglycemia: Secondary | ICD-10-CM | POA: Diagnosis not present

## 2024-01-08 DIAGNOSIS — Z0289 Encounter for other administrative examinations: Secondary | ICD-10-CM

## 2024-01-11 DIAGNOSIS — L4050 Arthropathic psoriasis, unspecified: Secondary | ICD-10-CM | POA: Diagnosis not present

## 2024-01-11 DIAGNOSIS — M1990 Unspecified osteoarthritis, unspecified site: Secondary | ICD-10-CM | POA: Diagnosis not present

## 2024-01-11 DIAGNOSIS — Z79899 Other long term (current) drug therapy: Secondary | ICD-10-CM | POA: Diagnosis not present

## 2024-01-11 DIAGNOSIS — M797 Fibromyalgia: Secondary | ICD-10-CM | POA: Diagnosis not present

## 2024-01-27 DIAGNOSIS — R6889 Other general symptoms and signs: Secondary | ICD-10-CM | POA: Diagnosis not present

## 2024-02-02 DIAGNOSIS — I119 Hypertensive heart disease without heart failure: Secondary | ICD-10-CM | POA: Diagnosis not present

## 2024-02-02 DIAGNOSIS — D849 Immunodeficiency, unspecified: Secondary | ICD-10-CM | POA: Diagnosis not present

## 2024-02-02 DIAGNOSIS — N1832 Chronic kidney disease, stage 3b: Secondary | ICD-10-CM | POA: Diagnosis not present

## 2024-02-02 DIAGNOSIS — E1165 Type 2 diabetes mellitus with hyperglycemia: Secondary | ICD-10-CM | POA: Diagnosis not present

## 2024-02-02 DIAGNOSIS — I160 Hypertensive urgency: Secondary | ICD-10-CM | POA: Diagnosis not present

## 2024-02-02 DIAGNOSIS — R0602 Shortness of breath: Secondary | ICD-10-CM | POA: Diagnosis not present

## 2024-02-02 DIAGNOSIS — J069 Acute upper respiratory infection, unspecified: Secondary | ICD-10-CM | POA: Diagnosis not present

## 2024-02-02 DIAGNOSIS — R051 Acute cough: Secondary | ICD-10-CM | POA: Diagnosis not present

## 2024-02-05 ENCOUNTER — Encounter (INDEPENDENT_AMBULATORY_CARE_PROVIDER_SITE_OTHER): Payer: Self-pay | Admitting: Family Medicine

## 2024-02-05 ENCOUNTER — Ambulatory Visit (INDEPENDENT_AMBULATORY_CARE_PROVIDER_SITE_OTHER): Payer: BC Managed Care – PPO | Admitting: Family Medicine

## 2024-02-05 VITALS — BP 136/82 | Temp 97.9°F | Ht 64.0 in | Wt 192.0 lb

## 2024-02-05 DIAGNOSIS — I152 Hypertension secondary to endocrine disorders: Secondary | ICD-10-CM | POA: Diagnosis not present

## 2024-02-05 DIAGNOSIS — E1159 Type 2 diabetes mellitus with other circulatory complications: Secondary | ICD-10-CM

## 2024-02-05 DIAGNOSIS — E782 Mixed hyperlipidemia: Secondary | ICD-10-CM | POA: Diagnosis not present

## 2024-02-05 DIAGNOSIS — E1169 Type 2 diabetes mellitus with other specified complication: Secondary | ICD-10-CM | POA: Diagnosis not present

## 2024-02-05 DIAGNOSIS — Z7985 Long-term (current) use of injectable non-insulin antidiabetic drugs: Secondary | ICD-10-CM

## 2024-02-05 DIAGNOSIS — F17210 Nicotine dependence, cigarettes, uncomplicated: Secondary | ICD-10-CM

## 2024-02-05 DIAGNOSIS — E669 Obesity, unspecified: Secondary | ICD-10-CM

## 2024-02-05 DIAGNOSIS — R5383 Other fatigue: Secondary | ICD-10-CM | POA: Diagnosis not present

## 2024-02-05 DIAGNOSIS — R0602 Shortness of breath: Secondary | ICD-10-CM | POA: Diagnosis not present

## 2024-02-05 DIAGNOSIS — Z1331 Encounter for screening for depression: Secondary | ICD-10-CM | POA: Diagnosis not present

## 2024-02-05 DIAGNOSIS — N1832 Chronic kidney disease, stage 3b: Secondary | ICD-10-CM

## 2024-02-05 DIAGNOSIS — Z6832 Body mass index (BMI) 32.0-32.9, adult: Secondary | ICD-10-CM

## 2024-02-05 DIAGNOSIS — F17200 Nicotine dependence, unspecified, uncomplicated: Secondary | ICD-10-CM

## 2024-02-05 NOTE — Progress Notes (Signed)
 Maria [name], D.O.  ABFM, ABOM Specializing in Clinical Bariatric Medicine Office located at: [address]  [place], [place]  [address]   Bariatric Medicine Visit  Dear Maria Bender, MD   Thank you for referring Maria [name] to our clinic today for evaluation.  We performed a consultation to discuss her options for treatment and educate the patient on her disease state.  The following note includes my evaluation and treatment recommendations.   Please do not hesitate to reach out to me directly if you have any further concerns.   Assessment and Plan:   FOR THE DISEASE OF OBESITY: Obesity (BMI 30-39.9), Starting BMI 32.94 Assessment & Plan: Maria [name] is currently in the action stage of change. As such, her goal is to start our weight management plan.  She has agreed to: follow the Category 1 MP.    Behavioral Intervention We discussed the following Behavioral Modification Strategies today: begin to work on maintaining a reduced calorie state, getting the recommended amount of protein, incorporating whole foods, making healthy choices, staying well hydrated and practicing mindfulness/awareness when eating.  Additional resources provided today: handout on CAT 1 meal plan  and handout on recipe packet #1  Evidence-based interventions for health behavior change were utilized today including the discussion of self monitoring techniques, problem-solving barriers and SMART goal setting techniques.    Pt will specifically work on: n/a   Recommended Physical Activity Goals Maria [name] has been advised to work up to 150 minutes of moderate intensity aerobic activity a week and strengthening exercises 2-3 times per week for cardiovascular health, weight loss maintenance and preservation of muscle mass.   She has agreed to : maintain current level of activity.    Pharmacotherapy We both agreed to : begin with nutritional and behavioral strategies   FOR ASSOCIATED  CONDITIONS ADDRESSED TODAY:  Fatigue Assessment & Plan: Maria [name] does feel that her weight is causing her energy to be lower than it should be. Fatigue may be related to obesity, depression or many other causes. she does not appear to have any red flag symptoms and this appears to most likely be related to her current lifestyle habits and dietary intake.  Labs will be ordered and reviewed with her at their next office visit in two weeks.  Epworth sleepiness scale is 3 and appears to be within normal limits. Maria [name] denies daytime somnolence and admits to waking up still tired. Maria [name] generally gets 4 hours of sleep per night, and states that she has generally unrestful sleep. Pt not sure if snoring is present since she lives alone. Apneic episodes is not present.   ECG: Performed and reviewed/ interpreted independently.  Normal sinus rhythm, rate 62 bpm, reassuring without any acute abnormalities, will continue to monitor for symptoms   Orders:  -     EKG 12-Lead   Shortness of breath on exertion Assessment & Plan: Maria [name] does feel that she gets out of breath more easily than she used to when she exercises and seems to be worsening over time with weight gain.  This has gotten worse recently. Maria [name] denies shortness of breath at rest or orthopnea. Maria [name]'s shortness of breath appears to be obesity related and exercise induced, as they do not appear to have any "red flag" symptoms/ concerns today.  Also, this condition appears to be related to a state of poor cardiovascular conditioning   Obtain labs today and will be reviewed with her at their next office visit in two weeks.  Indirect Calorimeter completed today to help guide our dietary regimen. It shows a VO2 of 203 and a REE of 1397.  Her calculated basal metabolic rate is 2956 thus her resting energy expenditure is worse than expected.  Patient agreed to work on weight loss at this time.  As [name] progresses through our weight  loss program, we will gradually increase exercise as tolerated to treat her current condition.   If [name] follows our recommendations and loses 5-10% of their weight without improvement of her shortness of breath or if at any time, symptoms become more concerning, they agree to urgently follow up with their PCP/ specialist for further consideration/ evaluation.   Maria [name] verbalizes agreement with this plan.    Depression Screen  Assessment & Plan: Her Food and Mood (modified PHQ-9) score was 9. She denies SI/HI. She feels that emotional eating is not a problematic issue. Will continue to monitor condition.    Type 2 diabetes mellitus with obesity (HCC) Assessment & Plan: Condition managed by PCP. She was diagnosed with T2DM about 7 years ago. At that time, she "had pneumonia " and her Hemoglobin A1c was 13.4.   Per pt - her latest Hgb A1c was 8.0 one month ago or so - she did not bring in any labs for our review today as instructed in her new pt packet.  T2DM treated with Trulicity 1.5 mg weekly, Glipizide 5 mg twice daily, and "PRN Toujeo". In the last couple of weeks, her fasting blood sugars have ranged from 160's-180's.   Pt educated on the detriments of having a Hgb A1c >7. Discussed that Touejeo should be used daily and not on a sliding scale prn basis. Recommended pt discuss this further with PCP to ensure she is properly using the Touejeo. Discussed lifestyle modifications including diet, exercise and weight loss for the treatment of diabetes. Pt instructed to bring in most recent labs with PCP and provide [place] with her updated med list next OV.    Hypertension associated with diabetes (HCC) Assessment & Plan: Last 3 blood pressure readings in our office are as follows: BP Readings from Last 3 Encounters:  02/05/24 136/82  08/29/23 137/80  05/30/23 124/79   HTN treated with Dyazide, Cardizem LA, and Tenormin. Her BP is stable - no concerns in this regard. Continue adherence to  antihypertensive therapy and begin low sodium diet, advance exercise as able.     Mixed diabetic hyperlipidemia associated with type 2 diabetes mellitus (HCC) Assessment & Plan: Per history. Pt is on Rosuvastatin 5 mg daily - tolerating well. Continue with statin and begin our treatment plan of heart-heathy, low cholesterol meal plan. Will check Lipid Panel today.    Stage 3b chronic kidney disease (HCC) Assessment & Plan: Condition managed by PCP - pt states she has never seen a nephrologist.  Pt was told that her CKD is mostly likely secondary to poorly controlled diabetes. Per most recent labs on 08/29/23: Serum Creatinine of 1.67 and GFR of 35.   Will check labs today - if her kidney function is still diminished we recommend she discuss with PCP about referral to nephrology. Adequate hydration, exercise, controlling blood sugars/blood pressure, and avoiding NSAIDs can also improve kidney function.   Orders:  -     VITAMIN D 25 Hydroxy (Vit-D Deficiency, Fractures) -     TSH -     T4, free -     Lipid Panel With LDL/HDL Ratio -     Hemoglobin A1c -  Folate -     Comprehensive metabolic panel -     CBC with Differential/Platelet -     Vitamin B12   Tobacco dependence Assessment & Plan: Pt has been smoking for the last 25 years. She has 5 cigarettes per day. She smokes as a stress outlet and because she is addicted to the "suckling motion". She is trying to quit. 3-4 minutes of Tobacco cessation counseling was performed. Recommended pt to try sugar free lollipops or hard-candies. Will begin to monitor condition.    FOLLOW UP:   Follow up in 2 weeks. She was informed of the importance of frequent follow up visits to maximize her success with intensive lifestyle modifications for her multiple health conditions.  Maria [name] is aware that we will review all of her lab results at our next visit.  She is aware that if anything is critical/ life threatening with the  results, we will be contacting her via MyChart prior to the office visit to discuss management.    Chief Complaint:   OBESITY Fey Coghill (MR# [id number]) is a pleasant 63 y.o. female who presents for evaluation and treatment of obesity and related comorbidities. Current BMI is Body mass index is 32.96 kg/m. Maria [name] has been struggling with her weight for many years and has been unsuccessful in either losing weight, maintaining weight loss, or reaching her healthy weight goal.  Maria [name] is currently in the action stage of change and ready to dedicate time achieving and maintaining a healthier weight. Maria [name] is interested in becoming our patient and working on intensive lifestyle modifications including (but not limited to) diet and exercise for weight loss.  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 Patient is single and does not have children. She lives alone.  Desired weight - 160 lbs within a year "to be healthier"  Started gaining weight when she was on Prednisone for RA.   Has never tried a formal diet plan.   Typically eats out once a day for dinner mostly. She tries to order healthy foods like soups and salads.   Skips lunch most days.   Obstacles to cooking: "not knowing what to eat after I work that will not raise my blood sugars"  Craves:  "nothing"  Snacks on fritos and cookies mostly between meals and after dinner.   Food dislikes: "nothing"  Does not drink caloric beverages.  Worst Food Habit: "Fried Foods"  Subjective:   This is the patient's first visit at [facility].  The patient's NEW PATIENT PACKET that they filled out prior to today's office visit was reviewed at length and information from that paperwork was included within the following office visit note.    Included in the packet: current and past health history, medications,  allergies, ROS, gynecologic history (women only), surgical history, family history, social history, weight history, weight loss surgery history (for those that have had weight loss surgery), nutritional evaluation, mood and food questionnaire along with a depression screening (PHQ9) on all patients, an Epworth questionnaire, sleep habits questionnaire, patient life and health improvement goals questionnaire. These will all be scanned into the patient's chart under the "media" tab.   Review of Systems: Please refer to new patient packet scanned into media. Pertinent positives were addressed with patient today.  Reviewed by clinician on day of visit: allergies, medications, problem list, medical history, surgical history, family history, social history, and previous encounter  notes.  During the visit, I independently reviewed the patient's EKG, bioimpedance scale results, and indirect calorimeter results. I used this information to tailor a meal plan for the patient that will help Maria [name] to lose weight and will improve her obesity-related conditions going forward.  I performed a medically necessary appropriate examination and/or evaluation. I discussed the assessment and treatment plan with the patient. The patient was provided an opportunity to ask questions and all were answered. The patient agreed with the plan and demonstrated an understanding of the instructions. Labs were ordered today (unless patient declined them) and will be reviewed with the patient at our next visit unless more critical results need to be addressed immediately. Clinical information was updated and documented in the EMR.   Objective:   PHYSICAL EXAM: Blood pressure 136/82, temperature 97.9 F (36.6 C), height 5\' 4"  (1.626 m), weight 192 lb (87.1 kg), last menstrual period 05/21/2013. Body mass index is 32.96 kg/m.  General: Well Developed, well nourished, and in no acute distress.  HEENT: Normocephalic,  atraumatic; EOMI, sclerae are anicteric. Skin: Warm and dry, good turgor Chest:  Normal excursion, shape, no gross ABN Respiratory: No conversational dyspnea; speaking in full sentences NeuroM-Sk:  Normal gross ROM * 4 extremities  Psych: A and O *3, insight adequate, mood- full   Anthropometric Measurements Height: 5\' 4"  (1.626 m) Weight: 192 lb (87.1 kg) BMI (Calculated): 32.94 Starting Weight: 192 lb Peak Weight: 192 lb Waist Measurement : 44 inches   Body Composition  Body Fat %: 45.4 % Fat Mass (lbs): 87.4 lbs Muscle Mass (lbs): 99.6 lbs Total Body Water (lbs): 69 lbs Visceral Fat Rating : 35.9   Other Clinical Data RMR: [id number] Fasting: Yes Labs: Yes Today's Visit #: 1 Starting Date: 02/05/24    DIAGNOSTIC DATA REVIEWED:  BMET    Component Value Date/Time   NA 137 08/29/2023 1419   NA CANCELED 07/15/2022 1635   K 4.1 08/29/2023 1419   CL 104 08/29/2023 1419   CO2 21 (L) 08/29/2023 1419   GLUCOSE 224 (H) 08/29/2023 1419   BUN 31 (H) 08/29/2023 1419   BUN CANCELED 07/15/2022 1635   CREATININE 1.67 (H) 08/29/2023 1419   CALCIUM 8.6 (L) 08/29/2023 1419   GFRNONAA 35 (L) 08/29/2023 1419   GFRAA 59 (L) 11/07/2018 1000   Lab Results  Component Value Date   HGBA1C 13.4 (H) 03/04/2016   No results found for: "INSULIN" Lab Results  Component Value Date   TSH 1.612 03/03/2016   CBC    Component Value Date/Time   WBC 14.6 (H) 08/29/2023 1419   RBC 4.54 08/29/2023 1419   HGB 13.1 08/29/2023 1419   HGB 13.5 07/15/2022 1635   HCT 40.4 08/29/2023 1419   HCT 40.3 07/15/2022 1635   PLT 380 08/29/2023 1419   PLT 246 07/15/2022 1635   MCV 89.0 08/29/2023 1419   MCV 92 07/15/2022 1635   MCH 28.9 08/29/2023 1419   MCHC 32.4 08/29/2023 1419   RDW 13.6 08/29/2023 1419   RDW 13.3 07/15/2022 1635   Iron Studies No results found for: "IRON", "TIBC", "FERRITIN", "IRONPCTSAT" Lipid Panel  No results found for: "CHOL", "TRIG", "HDL", "CHOLHDL", "VLDL", "LDLCALC",  "LDLDIRECT" Hepatic Function Panel     Component Value Date/Time   PROT 7.6 08/29/2023 1419   PROT CANCELED 07/15/2022 1635   ALBUMIN 3.9 08/29/2023 1419   ALBUMIN CANCELED 07/15/2022 1635   AST 11 (L) 08/29/2023 1419   ALT 16 08/29/2023 1419   ALKPHOS  99 08/29/2023 1419   BILITOT 0.2 (L) 08/29/2023 1419   BILITOT CANCELED 07/15/2022 1635      Component Value Date/Time   TSH 1.612 03/03/2016 2000   Nutritional No results found for: "VD25OH"  Attestation Statements:   I, Special Puri, acting as a Stage manager for [employer], DO., have compiled all relevant documentation for today's office visit on behalf of Maria Lot, DO, while in the presence of [employer], DO.  Reviewed by clinician on day of visit: allergies, medications, problem list, medical history, surgical history, family history, social history, and previous encounter notes pertinent to patient's obesity diagnosis. I have spent 72 minutes in the care of the patient today including: preparing to see patient (e.g. review and interpretation of tests, old notes ), obtaining and/or reviewing separately obtained history, performing a medically appropriate examination or evaluation, counseling and educating the patient, ordering medications, test or procedures, documenting clinical information in the electronic or other health care record, and independently interpreting results and communicating results to the patient, family, or caregiver   I have reviewed the above documentation for accuracy and completeness, and I agree with the above. Maria [name], D.O.  The 21st Century Cures Act was signed into law in 2016 which includes the topic of electronic health records.  This provides immediate access to information in MyChart.  This includes consultation notes, operative notes, office notes, lab results and pathology reports.  If you have any questions about what you read please let us know at your next visit so we  can discuss your concerns and take corrective action if need be.  We are right here with you.

## 2024-02-07 LAB — CBC WITH DIFFERENTIAL/PLATELET
Basophils Absolute: 0.1 10*3/uL (ref 0.0–0.2)
Basos: 1 %
EOS (ABSOLUTE): 0.4 10*3/uL (ref 0.0–0.4)
Eos: 4 %
Hematocrit: 39.5 % (ref 34.0–46.6)
Hemoglobin: 12.7 g/dL (ref 11.1–15.9)
Immature Grans (Abs): 0 10*3/uL (ref 0.0–0.1)
Immature Granulocytes: 0 %
Lymphocytes Absolute: 3.7 10*3/uL — ABNORMAL HIGH (ref 0.7–3.1)
Lymphs: 35 %
MCH: 29.6 pg (ref 26.6–33.0)
MCHC: 32.2 g/dL (ref 31.5–35.7)
MCV: 92 fL (ref 79–97)
Monocytes Absolute: 0.5 10*3/uL (ref 0.1–0.9)
Monocytes: 5 %
Neutrophils Absolute: 5.9 10*3/uL (ref 1.4–7.0)
Neutrophils: 55 %
Platelets: 336 10*3/uL (ref 150–450)
RBC: 4.29 x10E6/uL (ref 3.77–5.28)
RDW: 13.1 % (ref 11.7–15.4)
WBC: 10.6 10*3/uL (ref 3.4–10.8)

## 2024-02-07 LAB — COMPREHENSIVE METABOLIC PANEL
ALT: 18 [IU]/L (ref 0–32)
AST: 20 [IU]/L (ref 0–40)
Albumin: 4.4 g/dL (ref 3.8–4.9)
Alkaline Phosphatase: 152 [IU]/L — ABNORMAL HIGH (ref 44–121)
BUN/Creatinine Ratio: 16 (ref 12–28)
BUN: 18 mg/dL (ref 8–27)
Bilirubin Total: <0.2 mg/dL (ref 0.0–1.2)
CO2: 22 mmol/L (ref 20–29)
Calcium: 9.5 mg/dL (ref 8.7–10.3)
Chloride: 106 mmol/L (ref 96–106)
Creatinine, Ser: 1.1 mg/dL — ABNORMAL HIGH (ref 0.57–1.00)
Globulin, Total: 2.8 g/dL (ref 1.5–4.5)
Glucose: 126 mg/dL — ABNORMAL HIGH (ref 70–99)
Potassium: 4.9 mmol/L (ref 3.5–5.2)
Sodium: 143 mmol/L (ref 134–144)
Total Protein: 7.2 g/dL (ref 6.0–8.5)
eGFR: 58 mL/min/{1.73_m2} — ABNORMAL LOW (ref >59)

## 2024-02-07 LAB — LIPID PANEL WITH LDL/HDL RATIO
Cholesterol, Total: 193 mg/dL (ref 100–199)
HDL: 64 mg/dL (ref >39)
LDL Chol Calc (NIH): 115 mg/dL — ABNORMAL HIGH (ref 0–99)
LDL/HDL Ratio: 1.8 {ratio} (ref 0.0–3.2)
Triglycerides: 76 mg/dL (ref 0–149)
VLDL Cholesterol Cal: 14 mg/dL (ref 5–40)

## 2024-02-07 LAB — HEMOGLOBIN A1C
Est. average glucose Bld gHb Est-mCnc: 189 mg/dL
Hgb A1c MFr Bld: 8.2 % — ABNORMAL HIGH (ref 4.8–5.6)

## 2024-02-07 LAB — VITAMIN B12: Vitamin B-12: 937 pg/mL (ref 232–1245)

## 2024-02-07 LAB — FOLATE: Folate: 9.6 ng/mL (ref >3.0)

## 2024-02-07 LAB — TSH: TSH: 1.21 u[IU]/mL (ref 0.450–4.500)

## 2024-02-07 LAB — T4, FREE: Free T4: 1.12 ng/dL (ref 0.82–1.77)

## 2024-02-07 LAB — VITAMIN D 25 HYDROXY (VIT D DEFICIENCY, FRACTURES): Vit D, 25-Hydroxy: 41.8 ng/mL (ref 30.0–100.0)

## 2024-02-19 ENCOUNTER — Encounter (INDEPENDENT_AMBULATORY_CARE_PROVIDER_SITE_OTHER): Payer: Self-pay | Admitting: Family Medicine

## 2024-02-19 ENCOUNTER — Ambulatory Visit (INDEPENDENT_AMBULATORY_CARE_PROVIDER_SITE_OTHER): Payer: BC Managed Care – PPO | Admitting: Family Medicine

## 2024-02-19 VITALS — BP 134/84 | HR 75 | Temp 98.3°F | Ht 64.0 in | Wt 189.0 lb

## 2024-02-19 DIAGNOSIS — I152 Hypertension secondary to endocrine disorders: Secondary | ICD-10-CM

## 2024-02-19 DIAGNOSIS — N1832 Chronic kidney disease, stage 3b: Secondary | ICD-10-CM

## 2024-02-19 DIAGNOSIS — E559 Vitamin D deficiency, unspecified: Secondary | ICD-10-CM

## 2024-02-19 DIAGNOSIS — E1169 Type 2 diabetes mellitus with other specified complication: Secondary | ICD-10-CM | POA: Diagnosis not present

## 2024-02-19 DIAGNOSIS — E669 Obesity, unspecified: Secondary | ICD-10-CM

## 2024-02-19 DIAGNOSIS — E1122 Type 2 diabetes mellitus with diabetic chronic kidney disease: Secondary | ICD-10-CM | POA: Diagnosis not present

## 2024-02-19 DIAGNOSIS — Z6832 Body mass index (BMI) 32.0-32.9, adult: Secondary | ICD-10-CM

## 2024-02-19 DIAGNOSIS — E1159 Type 2 diabetes mellitus with other circulatory complications: Secondary | ICD-10-CM | POA: Diagnosis not present

## 2024-02-19 DIAGNOSIS — Z7985 Long-term (current) use of injectable non-insulin antidiabetic drugs: Secondary | ICD-10-CM

## 2024-02-19 DIAGNOSIS — Z7984 Long term (current) use of oral hypoglycemic drugs: Secondary | ICD-10-CM

## 2024-02-19 DIAGNOSIS — E782 Mixed hyperlipidemia: Secondary | ICD-10-CM

## 2024-02-19 MED ORDER — VITAMIN D3 50 MCG (2000 UT) PO CAPS: 2000.0000 [IU] | ORAL_CAPSULE | Freq: Every day | ORAL | Status: DC

## 2024-02-19 NOTE — Progress Notes (Addendum)
 Maria [name], D.O.  ABFM, ABOM Clinical Bariatric Medicine Physician  Office located at: [address] [place], [place]  [address]     Assessment and Plan:  No orders of the defined types were placed in this encounter.   There are no discontinued medications.   Meds ordered this encounter  Medications   Cholecalciferol (VITAMIN D3) 50 MCG (2000 UT) capsule    Sig: Take 1 capsule (2,000 Units total) by mouth daily.      FOR THE DISEASE OF OBESITY: BMI 32.0-32.9,adult- current BMI 32.43 Obesity (BMI 30-39.9), Starting BMI 32.94 Assessment & Plan: Since last office visit on 02/05/2024 patient's  Muscle mass has decreased by 0.6 lbs. Fat mass has decreased by 2 lbs. Total body water has decreased by 2.8 lbs.  Counseling done on how various foods will affect these numbers and how to maximize success  Total lbs lost to date: 3 lbs Total weight loss percentage to date: 1.56%    Recommended Dietary Goals Roe is currently in the action stage of change. As such, her goal is to continue weight management plan.  She has agreed to: continue current plan   Behavioral Intervention We discussed the following Behavioral Modification Strategies today: High protein oatmeal alternatives, avoiding fatty meats, increasing lean protein intake to established goals, decreasing simple carbohydrates , increasing water intake , and continue to work on implementation of reduced calorie nutritional plan  Additional resources provided today: Handout on CAT 1-2 breakfast options and Handout on CAT 1-2 lunch options, Handout on healthy tuna salad recipe, Handout on non-starchy vegetables  Evidence-based interventions for health behavior change were utilized today including the discussion of self monitoring techniques, problem-solving barriers and SMART goal setting techniques.   Regarding patient's less desirable eating habits and patterns, we employed the technique of small changes.    Pt will specifically work on: n/a.    Recommended Physical Activity Goals Darinda has been advised to work up to 150 minutes of moderate intensity aerobic activity a week and strengthening exercises 2-3 times per week for cardiovascular health, weight loss maintenance and preservation of muscle mass.   She has agreed to : Think about enjoyable ways to increase daily physical activity (such as walking more)   Pharmacotherapy We discussed various medication options to help [name] with her weight loss efforts and we both agreed to : continue with nutritional and behavioral strategies   FOR ASSOCIATED CONDITIONS ADDRESSED TODAY:  Type 2 diabetes mellitus with obesity [facility]) Assessment & Plan: Lab Results  Component Value Date   HGBA1C 8.2 (H) 02/05/2024   HGBA1C 13.4 (H) 03/04/2016   Lab Results  Component Value Date   TSH 1.210 02/05/2024   FREET4 1.12 02/05/2024    Lab Results  Component Value Date   WBC 10.6 02/05/2024   HGB 12.7 02/05/2024   HCT 39.5 02/05/2024   MCV 92 02/05/2024   PLT 336 02/05/2024   Lab Results  Component Value Date   VITAMINB12 937 02/05/2024   FOLATE 9.6 02/05/2024       Component Value Date/Time   PROT 7.2 02/05/2024 1002   ALBUMIN 4.4 02/05/2024 1002   AST 20 02/05/2024 1002   ALT 18 02/05/2024 1002   ALKPHOS 152 (H) 02/05/2024 1002   BILITOT <0.2 02/05/2024 1002  [name] is treating her T2DM with Trulicity 1.5 mg once weekly and Glipizide 5 mg twice daily. If her blood sugar goes over 130, she takes Humalog on a sliding scale. Also, she uses  Troujeo very rarely. Humalog and Troujeo are given in office by PCP.  A1c has improved from 13.4 to 8.2 but above goal and quite elevated. Alkphos is likely elevated d/t Vit D deficiency. Thyroid panel, folate levels, liver enzymes, CBC, and B12 are WNL.  Educated on blood sugar and insulin levels as a result of lack of protein. Higher insulin levels can increase hunger/cravings. Avoid sweets,  carbs, and increase lean proteins to control A1c levels. Will consider GLP-1 medication in the future   Hypertension associated with diabetes [facility]) Assessment & Plan: BP Readings from Last 3 Encounters:  02/19/24 134/84  02/05/24 136/82  08/29/23 137/80  Relevant medications: Dyazide 37.5-25 mg once daily, Cardizem LA 360 mg daily, and Tenormin 25 mg daily. Condition is well controlled. No concerns today in this regard. Continue current medication regimen.     Mixed diabetic hyperlipidemia associated with type 2 diabetes mellitus [facility]) Assessment & Plan: Lab Results  Component Value Date   CHOL 193 02/05/2024   HDL 64 02/05/2024   LDLCALC 115 (H) 02/05/2024   TRIG 76 02/05/2024   Last lipid panel as above. The 10-year ASCVD risk score (Arnett DK, et al., 2019) is: 30.6%   Values used to calculate the score:     Age: 13 years     Sex: Female     Is Non-Hispanic African American: Yes     Diabetic: Yes     Tobacco smoker: Yes     Systolic Blood Pressure: 134 mmHg     Is BP treated: Yes     HDL Cholesterol: 64 mg/dL     Total Cholesterol: 193 mg/dL HLD is treated with Crestor 5 mg every other day. Pt reports myalgias when taking medication -- most likely from dehydration. Informed pt that her LDL is elevated at 115 and the goal for a diabetic is <70. Recommended pt talk to PCP about going to Crestor daily, increasing dose, or switching treatment plan to cholesterol injections to increase effectiveness and reduce SE. Avoiding saturated/trans fats, increasing water intake, and follow a heart-healthy diet may improve condition. Will continue to monitor.   Stage 3b chronic kidney disease [facility]) Assessment & Plan: Lab Results  Component Value Date   CREATININE 1.10 (H) 02/05/2024   BUN 18 02/05/2024   NA 143 02/05/2024   K 4.9 02/05/2024   CL 106 02/05/2024   CO2 22 02/05/2024   eGFR 58 (L) 02/05/2024  Reviewed lab results from LOV with pt. Creatinine improved from 1.67 to 1.10.  eGFR is slightly too low, goal is >60. Discussed that adequate hydration (95+ ounces of water daily), exercise, controlling blood sugars/blood pressure, and avoiding NSAIDs can also improve kidney function. Will recheck kidney function in 3 mths   Vitamin D deficiency (new onset) Assessment & Plan: Lab Results  Component Value Date   VD25OH 41.8 02/05/2024  Per last labs, pt's Vit D levels are slightly below goal at 41.8. Ideal Vit D levels are 50-70. Pt is asymptomatic. I discussed the importance of vitamin D to the patient's health and well-being as well as to their ability to lose weight. Start OTC Vit D 2000 lU daily. Answered all questions regarding the new prescription. Will recheck levels in 3 months to observe improvement.   FOLLOW UP:   Return in about 24 days (around 03/14/2024). She was informed of the importance of frequent follow up visits to maximize her success with intensive lifestyle modifications for her multiple health conditions.  Subjective:   Chief complaint:  Obesity Amran is here to discuss her progress with her obesity treatment plan. She is on the the Category 1 Plan and states she is following her eating plan approximately 90 % of the time. She states she is not exercising.  Interval History:  Natisha Trzcinski is here today for her first follow-up office visit since starting the program with [place]. Patient is off to a good start and has lost 3 lbs. Pt has been measuring her meals at home. An example consists of 1 cup of vegetables and 6 ounces of meat (either chicken or [place]). She endorses occasionally feeling hungry and eating snacks during these moments. Also, Darcy has not been eating out.  All blood work/ lab tests that were recently ordered by myself or an outside provider were reviewed with patient today per their request. Extended time was spent counseling her on all new disease processes that were discovered or preexisting ones that are affected by  BMI.  she understands that many of these abnormalities will need to monitored regularly along with the current treatment plan of prudent dietary changes, in which we are making each and every office visit, to improve these health parameters.  Pharmacotherapy for weight loss: She is not currently taking medications  for medical weight loss.    Review of Systems:  Pertinent positives were addressed with patient today.  Reviewed by clinician on day of visit: allergies, medications, problem list, medical history, surgical history, family history, social history, and previous encounter notes.   Weight Summary and Biometrics   Weight Lost Since Last Visit: 3 lb  Weight Gained Since Last Visit: 0 lb   Vitals Temp: 98.3 F (36.8 C) BP: 134/84 Pulse Rate: 75 SpO2: 99 %   Anthropometric Measurements Height: 5\' 4"  (1.626 m) Weight: 189 lb (85.7 kg) BMI (Calculated): 32.43 Weight at Last Visit: 192 lb Weight Lost Since Last Visit: 3 lb Weight Gained Since Last Visit: 0 lb Starting Weight: 192 lb Total Weight Loss (lbs): 3 lb (1.361 kg) Peak Weight: 192 lb Waist Measurement : 44 inches   Body Composition  Body Fat %: 45 % Fat Mass (lbs): 85.4 lbs Muscle Mass (lbs): 99 lbs Total Body Water (lbs): 66.2 lbs Visceral Fat Rating : 12   Other Clinical Data RMR: [id number] Fasting: no Labs: no Today's Visit #: 2 Starting Date: 02/05/24     Objective:   PHYSICAL EXAM:  Blood pressure 134/84, pulse 75, temperature 98.3 F (36.8 C), height 5\' 4"  (1.626 m), weight 189 lb (85.7 kg), last menstrual period 05/21/2013, SpO2 99%. Body mass index is 32.44 kg/m.  General: she is overweight, cooperative and in no acute distress.   HEENT: EOMI, sclerae are anicteric. Lungs: Normal breathing effort, no conversational dyspnea. M-Sk:  Normal gross ROM * 4 extremities  PSYCH: Has normal mood, affect and thought process. Neurologic: No gross sensory or motor deficits. Well developed, A and  O * 3  DIAGNOSTIC DATA REVIEWED:  BMET    Component Value Date/Time   NA 143 02/05/2024 1002   K 4.9 02/05/2024 1002   CL 106 02/05/2024 1002   CO2 22 02/05/2024 1002   GLUCOSE 126 (H) 02/05/2024 1002   GLUCOSE 224 (H) 08/29/2023 1419   BUN 18 02/05/2024 1002   CREATININE 1.10 (H) 02/05/2024 1002   CALCIUM 9.5 02/05/2024 1002   GFRNONAA 35 (L) 08/29/2023 1419   GFRAA 59 (L) 11/07/2018 1000   Lab Results  Component Value Date   HGBA1C 8.2 (H) 02/05/2024  HGBA1C 13.4 (H) 03/04/2016   No results found for: "INSULIN" Lab Results  Component Value Date   TSH 1.210 02/05/2024   CBC    Component Value Date/Time   WBC 10.6 02/05/2024 1002   WBC 14.6 (H) 08/29/2023 1419   RBC 4.29 02/05/2024 1002   RBC 4.54 08/29/2023 1419   HGB 12.7 02/05/2024 1002   HCT 39.5 02/05/2024 1002   PLT 336 02/05/2024 1002   MCV 92 02/05/2024 1002   MCH 29.6 02/05/2024 1002   MCH 28.9 08/29/2023 1419   MCHC 32.2 02/05/2024 1002   MCHC 32.4 08/29/2023 1419   RDW 13.1 02/05/2024 1002   Iron Studies No results found for: "IRON", "TIBC", "FERRITIN", "IRONPCTSAT" Lipid Panel     Component Value Date/Time   CHOL 193 02/05/2024 1002   TRIG 76 02/05/2024 1002   HDL 64 02/05/2024 1002   LDLCALC 115 (H) 02/05/2024 1002   Hepatic Function Panel     Component Value Date/Time   PROT 7.2 02/05/2024 1002   ALBUMIN 4.4 02/05/2024 1002   AST 20 02/05/2024 1002   ALT 18 02/05/2024 1002   ALKPHOS 152 (H) 02/05/2024 1002   BILITOT <0.2 02/05/2024 1002      Component Value Date/Time   TSH 1.210 02/05/2024 1002   Nutritional Lab Results  Component Value Date   VD25OH 41.8 02/05/2024    Attestations:   Reviewed by clinician on day of visit: allergies, medications, problem list, medical history, surgical history, family history, social history, and previous encounter notes pertinent to patient's obesity diagnosis.   I have spent 62 minutes in the care of the patient today  including: preparing to see patient (e.g. review and interpretation of tests, old notes ), obtaining and/or reviewing separately obtained history, performing a medically appropriate examination or evaluation, counseling and educating the patient, ordering medications, test or procedures, documenting clinical information in the electronic or other health care record, and independently interpreting results and communicating results to the patient, family, or caregiver   I, Camryn Mix, acting as a medical scribe for Thomasene Lot, DO., have compiled all relevant documentation for today's office visit on behalf of Thomasene Lot, DO, while in the presence of [employer], DO.  I have reviewed the above documentation for accuracy and completeness, and I agree with the above. Maria [name], D.O.  The 21st Century Cures Act was signed into law in 2016 which includes the topic of electronic health records.  This provides immediate access to information in MyChart.  This includes consultation notes, operative notes, office notes, lab results and pathology reports.  If you have any questions about what you read please let us know at your next visit so we can discuss your concerns and take corrective action if need be.  We are right here with you.

## 2024-02-23 DIAGNOSIS — J4541 Moderate persistent asthma with (acute) exacerbation: Secondary | ICD-10-CM | POA: Diagnosis not present

## 2024-02-23 DIAGNOSIS — E1122 Type 2 diabetes mellitus with diabetic chronic kidney disease: Secondary | ICD-10-CM | POA: Diagnosis not present

## 2024-02-23 DIAGNOSIS — K2100 Gastro-esophageal reflux disease with esophagitis, without bleeding: Secondary | ICD-10-CM | POA: Diagnosis not present

## 2024-02-23 DIAGNOSIS — I160 Hypertensive urgency: Secondary | ICD-10-CM | POA: Diagnosis not present

## 2024-02-23 DIAGNOSIS — I119 Hypertensive heart disease without heart failure: Secondary | ICD-10-CM | POA: Diagnosis not present

## 2024-02-24 DIAGNOSIS — R6889 Other general symptoms and signs: Secondary | ICD-10-CM | POA: Diagnosis not present

## 2024-03-14 ENCOUNTER — Encounter (INDEPENDENT_AMBULATORY_CARE_PROVIDER_SITE_OTHER): Payer: Self-pay | Admitting: Family Medicine

## 2024-03-14 ENCOUNTER — Ambulatory Visit (INDEPENDENT_AMBULATORY_CARE_PROVIDER_SITE_OTHER): Payer: BC Managed Care – PPO | Admitting: Family Medicine

## 2024-03-14 VITALS — BP 106/55 | HR 64 | Temp 97.7°F | Ht 64.0 in | Wt 185.0 lb

## 2024-03-14 DIAGNOSIS — I152 Hypertension secondary to endocrine disorders: Secondary | ICD-10-CM | POA: Diagnosis not present

## 2024-03-14 DIAGNOSIS — E782 Mixed hyperlipidemia: Secondary | ICD-10-CM | POA: Diagnosis not present

## 2024-03-14 DIAGNOSIS — E1159 Type 2 diabetes mellitus with other circulatory complications: Secondary | ICD-10-CM

## 2024-03-14 DIAGNOSIS — E1169 Type 2 diabetes mellitus with other specified complication: Secondary | ICD-10-CM

## 2024-03-14 DIAGNOSIS — E669 Obesity, unspecified: Secondary | ICD-10-CM

## 2024-03-14 DIAGNOSIS — Z6831 Body mass index (BMI) 31.0-31.9, adult: Secondary | ICD-10-CM

## 2024-03-14 MED ORDER — TIRZEPATIDE 5 MG/0.5ML ~~LOC~~ SOAJ: 5.0000 mg | SUBCUTANEOUS | 0 refills | Status: DC

## 2024-03-14 NOTE — Progress Notes (Signed)
 Maria [name], D.O.  ABFM, ABOM Specializing in Clinical Bariatric Medicine  Office located at: [address] [place], [place]  [address]   Assessment and Plan:   No orders of the defined types were placed in this encounter.   Medications Discontinued During This Encounter  Medication Reason   ARNUITY ELLIPTA 100 MCG/ACT AEPB Prescription never filled   Dulaglutide (TRULICITY) 1.5 MG/0.5ML SOPN      Meds ordered this encounter  Medications   tirzepatide (MOUNJARO) 5 MG/0.5ML Pen    Sig: Inject 5 mg into the skin once a week.    Dispense:  2 mL    Refill:  0      FOR THE DISEASE OF OBESITY: BMI 31.0-31.9,adult - Current BMI 31.74 Obesity (BMI 30-39.9), Starting BMI 32.94 Assessment & Plan: Since last office visit on 02/19/24 patient's muscle mass has increased by 2.4 lb. Fat mass has decreased by 6.6 lb. Total body water has decreased by 0.2 lb.  Counseling done on how various foods will affect these numbers and how to maximize success  Total lbs lost to date: 7 lbs Total weight loss percentage to date: -3.65%   Recommended Dietary Goals Sailor is currently in the action stage of change. As such, her goal is to continue weight management plan.  She has agreed to: continue current plan   Behavioral Intervention We discussed the following today: increasing lean protein intake to established goals, increasing water intake , celebration eating strategies, and continue to work on maintaining a reduced calorie state, getting the recommended amount of protein, incorporating whole foods, making healthy choices, staying well hydrated and practicing mindfulness when eating.  Additional resources provided today: None  Evidence-based interventions for health behavior change were utilized today including the discussion of self monitoring techniques, problem-solving barriers and SMART goal setting techniques.   Regarding patient's less desirable eating habits and  patterns, we employed the technique of small changes.   Pt will specifically work on: N/a   Recommended Physical Activity Goals Angeles has been advised to work up to 150 minutes of moderate intensity aerobic activity a week and strengthening exercises 2-3 times per week for cardiovascular health, weight loss maintenance and preservation of muscle mass.   She has agreed to :  Think about enjoyable ways to increase daily physical activity and overcoming barriers to exercise and Increase physical activity in their day and reduce sedentary time (increase NEAT).   Pharmacotherapy We both agreed to: continue with nutritional and behavioral strategies and Start Mounjaro today (see below).    FOR ASSOCIATED CONDITIONS ADDRESSED TODAY: Type 2 diabetes mellitus with obesity (HCC) Assessment & Plan: Lab Results  Component Value Date   HGBA1C 8.2 (H) 02/05/2024   HGBA1C 13.4 (H) 03/04/2016    Condition is very poorly controlled. Most recent A1C of 8.2 on 02/15/24 was quite elevated. Pt currently on Trulicity 1.5 mg once weekly and Glipizide 5 mg if her sugars are over 130s. Tolerating well with no SE reported. Pt has been monitoring her sugars at home. Reports fasting readings of 144 yesterday and 148 this morning. Lowest blood glucose reading is in the 130s. Hunger is uncontrolled. Pt reports hunger after dinner.   Discussed discontinuing Trulicity and starting Mounjaro for better control of sugars, A1c management, and condition overall. Reviewed the benefits of Mounjaro, including improved A1C control, reduced hunger/cravings, weight loss, and better kidney and heart protection. Reviewed potential risks and side effects with patient. Pt agreed to this plan. Will prescribe  Mounjaro 5 mg once weekly today. If blood sugars drop too low, we will consider reducing Glipizide dose by half and increasing Mounjaro as needed. Continue to monitor blood sugars regularly. Prioritize protein intake at every meal.  Will continue to monitor condition as it relates to her overall health and weight loss journey.   Orders: - Start Mounjaro 5 mg once weekly    Mixed diabetic hyperlipidemia associated with type 2 diabetes mellitus [facility]) Assessment & Plan: Lab Results  Component Value Date   CHOL 193 02/05/2024   HDL 64 02/05/2024   LDLCALC 115 (H) 02/05/2024   TRIG 76 02/05/2024   Waneda is on Crestor 5 mg every other day. Tolerating well with no SE reported. She has been working on [employer] and lifestyle changes. She tends to drink about 80-94 ounces of water per day.   Ideal LDL goal of less than 70 reviewed with pt. Given her LDL is 115, I recommend she double her Crestor dose and start taking 5 mg once daily. Encouraged pt to continue her efforts towards a heart healthy diet and start exercising. Advised pt to increase her water intake to at least half her body weight in ounces of water per day. Avoid any processed foods, fatty meats, decrease consumption of sat/trans fast.    Hypertension associated with diabetes [facility]) Assessment & Plan: BP Readings from Last 3 Encounters:  03/14/24 (!) 106/55  02/19/24 134/84  02/05/24 136/82   BP is below goal today. Pt is on Dyazide 37.5-25 mg once daily, Cardizem LA 360 mg once daily, and Atenolol 25 mg once daily. Tolerating all meds well with no SE reported. Pt is asymptomatic.   If her blood pressures remain low, I recommend Atenolol be the first medication to cut back on, given the obesogenic effects of beta-blockers. Encouraged pt to monitor her BP regularly. Reminded pt that she is to check her blood pressure, pulse, and blood sugars if she ever feel poorly in any way. Will continue to monitor her condition as it relates to her overall health and weight loss journey.    Follow up:   Return in about 2 weeks (around 03/28/2024) for 2 weeks and schedule additional 2 week f/up. Marland Kitchen She was informed of the importance of frequent follow up visits to  maximize her success with intensive lifestyle modifications for her multiple health conditions.  Subjective:   Chief complaint: Obesity Christon is here to discuss her progress with her obesity treatment plan. She is on the Category 1 Plan with B/L options and states she is following her eating plan approximately 95% of the time. She states she is not exercising.  Interval History:  Emmalea Treanor is here for a follow up office visit. Since last OV on 02/19/24, she is down 4 lbs. Has been eating chicken thighs with 1 cup of vegetables. Her largest meal of the day tends to be dinner. This breakfast ate eggs and toast. Reports that she avoided temptation from an office party at work by eating her own home-made sandwich. She tends to eat 6 ounces of lean meat at dinner, but still endorses hunger about 1 hour after eating dinner. To satisfy her hunger/cravings she eats string cheese, yogurt, fruits, and drinks water. She drinks water if she ever does have cravings. She was recently put on Z-pak and prednisone after a recent illness (URI).  Pharmacotherapy for weight loss: She is currently taking no anti-obesity medication.   Review of Systems:  Pertinent positives were addressed  with patient today.  Reviewed by clinician on day of visit: allergies, medications, problem list, medical history, surgical history, family history, social history, and previous encounter notes.  Weight Summary and Biometrics   Weight Lost Since Last Visit: 4 lb  Weight Gained Since Last Visit: 0   Vitals Temp: 97.7 F (36.5 C) BP: (!) 106/55 Pulse Rate: 64 SpO2: 94 %   Anthropometric Measurements Height: 5\' 4"  (1.626 m) Weight: 185 lb (83.9 kg) BMI (Calculated): 31.74 Weight at Last Visit: 189 lb Weight Lost Since Last Visit: 4 lb Weight Gained Since Last Visit: 0 Starting Weight: 192 lb Total Weight Loss (lbs): 7 lb (3.175 kg) Peak Weight: 192 lb   Body Composition  Body Fat %: 42.5 % Fat  Mass (lbs): 78.8 lbs Muscle Mass (lbs): 101.4 lbs Total Body Water (lbs): 66 lbs Visceral Fat Rating : 11   Other Clinical Data Fasting: No Labs: No Today's Visit #: 3 Starting Date: 02/05/24    Objective:   PHYSICAL EXAM: Blood pressure (!) 106/55, pulse 64, temperature 97.7 F (36.5 C), height 5\' 4"  (1.626 m), weight 185 lb (83.9 kg), last menstrual period 05/21/2013, SpO2 94%. Body mass index is 31.76 kg/m.  General: she is overweight, cooperative and in no acute distress. PSYCH: Has normal mood, affect and thought process.   HEENT: EOMI, sclerae are anicteric. Lungs: Normal breathing effort, no conversational dyspnea. Extremities: Moves * 4 Neurologic: A and O * 3, good insight  DIAGNOSTIC DATA REVIEWED: BMET    Component Value Date/Time   NA 143 02/05/2024 1002   K 4.9 02/05/2024 1002   CL 106 02/05/2024 1002   CO2 22 02/05/2024 1002   GLUCOSE 126 (H) 02/05/2024 1002   GLUCOSE 224 (H) 08/29/2023 1419   BUN 18 02/05/2024 1002   CREATININE 1.10 (H) 02/05/2024 1002   CALCIUM 9.5 02/05/2024 1002   GFRNONAA 35 (L) 08/29/2023 1419   GFRAA 59 (L) 11/07/2018 1000   Lab Results  Component Value Date   HGBA1C 8.2 (H) 02/05/2024   HGBA1C 13.4 (H) 03/04/2016   No results found for: "INSULIN" Lab Results  Component Value Date   TSH 1.210 02/05/2024   CBC    Component Value Date/Time   WBC 10.6 02/05/2024 1002   WBC 14.6 (H) 08/29/2023 1419   RBC 4.29 02/05/2024 1002   RBC 4.54 08/29/2023 1419   HGB 12.7 02/05/2024 1002   HCT 39.5 02/05/2024 1002   PLT 336 02/05/2024 1002   MCV 92 02/05/2024 1002   MCH 29.6 02/05/2024 1002   MCH 28.9 08/29/2023 1419   MCHC 32.2 02/05/2024 1002   MCHC 32.4 08/29/2023 1419   RDW 13.1 02/05/2024 1002   Iron Studies No results found for: "IRON", "TIBC", "FERRITIN", "IRONPCTSAT" Lipid Panel     Component Value Date/Time   CHOL 193 02/05/2024 1002   TRIG 76 02/05/2024 1002   HDL 64 02/05/2024 1002   LDLCALC 115 (H)  02/05/2024 1002   Hepatic Function Panel     Component Value Date/Time   PROT 7.2 02/05/2024 1002   ALBUMIN 4.4 02/05/2024 1002   AST 20 02/05/2024 1002   ALT 18 02/05/2024 1002   ALKPHOS 152 (H) 02/05/2024 1002   BILITOT <0.2 02/05/2024 1002      Component Value Date/Time   TSH 1.210 02/05/2024 1002   Nutritional Lab Results  Component Value Date   VD25OH 41.8 02/05/2024    Attestations:   Burnett Sheng, acting as a Stage manager for [employer],  DO., have compiled all relevant documentation for today's office visit on behalf of Thomasene Lot, DO, while in the presence of Thomasene Lot, DO.  Reviewed by clinician on day of visit: allergies, medications, problem list, medical history, surgical history, family history, social history, and previous encounter notes pertinent to patient's obesity diagnosis.  I have reviewed the above documentation for accuracy and completeness, and I agree with the above. Maria [name], D.O.  The 21st Century Cures Act was signed into law in 2016 which includes the topic of electronic health records.  This provides immediate access to information in MyChart.  This includes consultation notes, operative notes, office notes, lab results and pathology reports.  If you have any questions about what you read please let us know at your next visit so we can discuss your concerns and take corrective action if need be.  We are right here with you.

## 2024-03-26 DIAGNOSIS — R6889 Other general symptoms and signs: Secondary | ICD-10-CM | POA: Diagnosis not present

## 2024-03-28 ENCOUNTER — Other Ambulatory Visit: Payer: Self-pay | Admitting: Obstetrics and Gynecology

## 2024-03-28 DIAGNOSIS — Z0000 Encounter for general adult medical examination without abnormal findings: Secondary | ICD-10-CM

## 2024-04-01 ENCOUNTER — Other Ambulatory Visit (INDEPENDENT_AMBULATORY_CARE_PROVIDER_SITE_OTHER): Payer: Self-pay | Admitting: Family Medicine

## 2024-04-01 ENCOUNTER — Telehealth (INDEPENDENT_AMBULATORY_CARE_PROVIDER_SITE_OTHER): Payer: Self-pay | Admitting: Family Medicine

## 2024-04-01 NOTE — Telephone Encounter (Signed)
 Good morning!  Patient states that their Joycelyn Man will not be filled because the pharmacy says the doctor needs to contact them. They will not tell her why.   Thanks!

## 2024-04-03 ENCOUNTER — Telehealth (INDEPENDENT_AMBULATORY_CARE_PROVIDER_SITE_OTHER): Payer: Self-pay | Admitting: Family Medicine

## 2024-04-03 NOTE — Telephone Encounter (Signed)
 04/09 pt wants a call regarding Manjaro needing to be refilled and taken by sat.

## 2024-04-04 MED ORDER — TIRZEPATIDE 5 MG/0.5ML ~~LOC~~ SOAJ: 5.0000 mg | SUBCUTANEOUS | 0 refills | Status: DC

## 2024-04-04 NOTE — Telephone Encounter (Signed)
 Patient returned call. She picked up her last prescription on 03/14/2024 from the pharmacy and she takes her shot on Saturdays. She is on her last pen and will need a refill before her next visit with Dr. Sharee Holster. Her next OV is scheduled for 4/23, but she will use the last pen this weekend. If no refill is sent to the pharmacy she will be without her medication for an entire week. Please advise if refill is appropriate. Medication has been pended and pharmacy verified.

## 2024-04-04 NOTE — Telephone Encounter (Signed)
 Called patient and LVM that we have contacted her Pharmacy and they confirmed that pt picked up her four injection on 03/15/24. Patient should have enough to last her till her next visit.

## 2024-04-09 ENCOUNTER — Telehealth (INDEPENDENT_AMBULATORY_CARE_PROVIDER_SITE_OTHER): Payer: Self-pay

## 2024-04-09 ENCOUNTER — Telehealth (INDEPENDENT_AMBULATORY_CARE_PROVIDER_SITE_OTHER): Payer: Self-pay | Admitting: Family Medicine

## 2024-04-09 NOTE — Telephone Encounter (Signed)
 Called patient and LVM to call around to other [facility] pharmacy to see switch has Monnjaro in stock. I called [facility] on [address] as well and they are out of stock.

## 2024-04-09 NOTE — Telephone Encounter (Signed)
 Left a message for patient to return my call. Her prescription for Mounjaro is not being filled because she needs a PA done. I do not see a note in her chart stating that it has been done and approved so I wanted to verify this with the patient prior to completing a PA.

## 2024-04-09 NOTE — Telephone Encounter (Signed)
 PA for Mounjaro 5MG   has been submitted, awaiting PA questions.

## 2024-04-09 NOTE — Telephone Encounter (Signed)
 04/15 pt calling upset, says she hasn't received answer about [name] wants a call

## 2024-04-11 NOTE — Telephone Encounter (Signed)
 Got a response on the PA for [facility] that reads as follows:   Message from Plan Prior Authorization Not Required  Notified Patient

## 2024-04-17 ENCOUNTER — Encounter (INDEPENDENT_AMBULATORY_CARE_PROVIDER_SITE_OTHER): Payer: Self-pay | Admitting: Family Medicine

## 2024-04-17 ENCOUNTER — Ambulatory Visit (INDEPENDENT_AMBULATORY_CARE_PROVIDER_SITE_OTHER): Admitting: Family Medicine

## 2024-04-17 VITALS — BP 102/68 | HR 71 | Temp 98.6°F | Ht 64.0 in | Wt 181.0 lb

## 2024-04-17 DIAGNOSIS — E1159 Type 2 diabetes mellitus with other circulatory complications: Secondary | ICD-10-CM

## 2024-04-17 DIAGNOSIS — E669 Obesity, unspecified: Secondary | ICD-10-CM

## 2024-04-17 DIAGNOSIS — I152 Hypertension secondary to endocrine disorders: Secondary | ICD-10-CM | POA: Diagnosis not present

## 2024-04-17 DIAGNOSIS — E1169 Type 2 diabetes mellitus with other specified complication: Secondary | ICD-10-CM | POA: Diagnosis not present

## 2024-04-17 DIAGNOSIS — E559 Vitamin D deficiency, unspecified: Secondary | ICD-10-CM

## 2024-04-17 DIAGNOSIS — E782 Mixed hyperlipidemia: Secondary | ICD-10-CM

## 2024-04-17 DIAGNOSIS — Z6831 Body mass index (BMI) 31.0-31.9, adult: Secondary | ICD-10-CM

## 2024-04-17 DIAGNOSIS — Z7984 Long term (current) use of oral hypoglycemic drugs: Secondary | ICD-10-CM

## 2024-04-17 DIAGNOSIS — Z7985 Long-term (current) use of injectable non-insulin antidiabetic drugs: Secondary | ICD-10-CM

## 2024-04-17 MED ORDER — GLIPIZIDE 5 MG PO TABS: 2.5000 mg | ORAL_TABLET | Freq: Every day | ORAL | Status: DC

## 2024-04-17 MED ORDER — VITAMIN D3 50 MCG (2000 UT) PO CAPS: 2000.0000 [IU] | ORAL_CAPSULE | Freq: Every day | ORAL | Status: DC

## 2024-04-17 MED ORDER — TIRZEPATIDE 7.5 MG/0.5ML ~~LOC~~ SOAJ: 7.5000 mg | SUBCUTANEOUS | 0 refills | Status: DC

## 2024-04-17 NOTE — Progress Notes (Signed)
 Maria [name], D.O.  ABFM, ABOM Specializing in Clinical Bariatric Medicine  Office located at: [address] [place], [place]  [address]   Assessment and Plan:  No orders of the defined types were placed in this encounter.  Medications Discontinued During This Encounter  Medication Reason   glipiZIDE  (GLUCOTROL ) 5 MG tablet Reorder   Cholecalciferol  (VITAMIN D3) 50 MCG (2000 UT) capsule Reorder    Meds ordered this encounter  Medications   tirzepatide (MOUNJARO) 7.5 MG/0.5ML Pen    Sig: Inject 7.5 mg into the skin once a week.    Dispense:  2 mL    Refill:  0    Please fill once her 5mg  script is complete.   Cholecalciferol  (VITAMIN D3) 50 MCG (2000 UT) capsule    Sig: Take 1 capsule (2,000 Units total) by mouth daily.   glipiZIDE  (GLUCOTROL ) 5 MG tablet    Sig: Take 0.5 tablets (2.5 mg total) by mouth daily before breakfast.     FOR THE DISEASE OF OBESITY:  BMI 31.0-31.9,adult - Current BMI 31.05 Obesity (BMI 30-39.9), Starting BMI 32.94 Assessment & Plan: Since last office visit on 03/14/2024, patient's muscle mass has decreased by 2.4 lbs. Fat mass has decreased by 2 lbs. Total body water has decreased by 0.6 lbs.  Counseling done on how various foods will affect these numbers and how to maximize success  Total lbs lost to date: 11 lbs Total weight loss percentage to date: 5.73%    Recommended Dietary Goals Maria [name] is currently in the action stage of change. As such, her goal is to continue weight management plan.  She has agreed to: continue current plan   Behavioral Intervention We discussed the following today: Strategies to avoid being hungry in enticing environments   Additional resources provided today: Handout on CAT 1 meal plan   Evidence-based interventions for health behavior change were utilized today including the discussion of self monitoring techniques, problem-solving barriers and SMART goal setting techniques.   Regarding patient's  less desirable eating habits and patterns, we employed the technique of small changes.   Pt will specifically work on: n/a   Recommended Physical Activity Goals Maria [name] has been advised to work up to 300-450 minutes of moderate intensity aerobic activity a week and strengthening exercises 2-3 times per week for cardiovascular health, weight loss maintenance and preservation of muscle mass.   She has agreed to :  Continue current level of physical activity    Pharmacotherapy We both agreed to : Increase Mounjaro to 7.5 mg once weekly   FOR ASSOCIATED CONDITIONS ADDRESSED TODAY:  Type 2 diabetes mellitus with obesity (Maria [name]) Assessment & Plan: Maria [name] takes Mounjaro 5 mg once weekly for this condition, also taking Glucotrol  5 mg daily for blood sugars. She recently started this medication and just took her second dose yesterday. Hunger well controlled, however pt is experiencing cravings. Denies any N/V/D. Maria [name] reports not feeling any difference with new dose since increase. Additionally, pt reports blood sugars of 112 and 70 in the mornings. She denies any hypoglycemic symptoms. In these moments, pt will eat candy and then food to raise blood sugars.   Educated pt on importance of eating adequate amounts of protein before sleeping so her blood sugars do not drop in the middle of the night. Encouraged Maria [name] to only eat a piece of candy if blood sugar drops below 70 and/or feeling hypoglycemic symptoms as sugar can cause levels to quickly rise then drop. In other cases, eat  a protein filled meal. Will decrease Glucotrol  dose to 2.5 mg daily. If blood sugars increase, take Glucotrol  2.5 mg twice daily. Additionally, will increase Mounjaro dose to 7.5 mg once weekly to control cravings. All questions thoroughly answered about both medication changes. Maria [name] verbalizes agreement to both changes. Continue closely adhering to Maria [name]. Will monitor condition as it pertains to her weight loss  journey.   Relevant Orders: -     Tirzepatide; Inject 7.5 mg into the skin once a week.  Dispense: 2 mL; Refill: 0 -     glipiZIDE ; Take 0.5 tablets (2.5 mg total) by mouth daily before breakfast.    Hypertension associated with diabetes Maria [name]) Assessment & Plan: BP Readings from Last 3 Encounters:  04/17/24 102/68  03/14/24 (!) 106/55  02/19/24 134/84  Relevant medications: Dyazide 37.5-25 mg once daily, Cardizem  LA 360 mg once daily, and Tenormin 25 mg once daily Maria [name] presented with BP slightly below goal, she is completely asymptomatic. Ambulatory blood pressure monitoring encouraged.  Reminded patient that if they ever feel poorly in any way, to check their blood pressure and pulse as well. Lifestyle changes such as following our low salt, heart healthy meal plan and engaging in a regular exercise program discussed. We will continue to monitor symptoms as they relate to the her weight loss journey.   Vitamin D  deficiency Assessment & Plan: Maria [name] is on Cholecalciferol  2000 units daily. Pt denies any complaints with this supplement. No acute concerns today in this regard. Continue supplement at current dose. Will refill today.   Relevant Orders: -     Vitamin D3; Take 1 capsule (2,000 Units total) by mouth daily.   Mixed diabetic hyperlipidemia associated with type 2 diabetes mellitus Maria [name]) Assessment & Plan: Maria [name] is taking Crestor 5 mg once daily. This was increased from every other day during LOV d/t elevated lipid levels. Pt is tolerating medication well with no adverse SE. She is improving her food intake and increasing her water intake. Maria [name] agrees to continue with meds and our treatment plan of a heart-heathy, low cholesterol meal plan.   Follow up:   Return in about 2 weeks (around 05/01/2024). She was informed of the importance of frequent follow up visits to maximize her success with intensive lifestyle modifications for her multiple health  conditions.  Subjective:   Chief complaint: Obesity Maria [name] is here to discuss her progress with her obesity treatment plan. She is on the the Category 1 Plan with B/L options with 8 ounces at dinner and states she is following her eating plan approximately 100% of the time. She states she is walking 15 minutes 7 days per week.  Interval History:  Maria [name] is here for a follow up office visit. Since last OV on 03/14/2024, pt is down 4 lbs. Maria [name] is eating yogurt, toast, and [place] bacon for breakfast, and 8 ounces of protein with vegetables for dinner. Also, pt endorses eating string cheese or greek yogurt as a snack. Additionally, Maria [name] was trying to incorporate weights but wants to speak with a specialist first as she has rods in her back.   Pharmacotherapy for weight loss: She is currently taking Mounjaro 5 mg once weekly.   Review of Systems:  Pertinent positives were addressed with patient today.  Reviewed by clinician on day of visit: allergies, medications, problem list, medical history, surgical history, family history, social history, and previous encounter notes.  Weight Summary and Biometrics   Weight Lost Since Last Visit: 3lb  Weight Gained  Since Last Visit: 0lb   Vitals Temp: 98.6 F (37 C) BP: 102/68 Pulse Rate: 71 SpO2: 98 %   Anthropometric Measurements Height: 5\' 4"  (1.626 m) Weight: 181 lb (82.1 kg) BMI (Calculated): 31.05 Weight at Last Visit: 189lb Weight Lost Since Last Visit: 3lb Weight Gained Since Last Visit: 0lb Starting Weight: 192lb Total Weight Loss (lbs): 11 lb (4.99 kg) Peak Weight: 192lb   Body Composition  Body Fat %: 42.4 % Fat Mass (lbs): 76.8 lbs Muscle Mass (lbs): 99 lbs Total Body Water (lbs): 65.4 lbs Visceral Fat Rating : 11   Other Clinical Data Fasting: No Labs: No Today's Visit #: 4 Starting Date: 02/05/24    Objective:   PHYSICAL EXAM: Blood pressure 102/68, pulse 71, temperature 98.6 F  (37 C), height 5\' 4"  (1.626 m), weight 181 lb (82.1 kg), last menstrual period 05/21/2013, SpO2 98%. Body mass index is 31.07 kg/m.  General: she is overweight, cooperative and in no acute distress. PSYCH: Has normal mood, affect and thought process.   HEENT: EOMI, sclerae are anicteric. Lungs: Normal breathing effort, no conversational dyspnea. Extremities: Moves * 4 Neurologic: A and O * 3, good insight  DIAGNOSTIC DATA REVIEWED: BMET    Component Value Date/Time   NA 143 02/05/2024 1002   K 4.9 02/05/2024 1002   CL 106 02/05/2024 1002   CO2 22 02/05/2024 1002   GLUCOSE 126 (H) 02/05/2024 1002   GLUCOSE 224 (H) 08/29/2023 1419   BUN 18 02/05/2024 1002   CREATININE 1.10 (H) 02/05/2024 1002   CALCIUM  9.5 02/05/2024 1002   GFRNONAA 35 (L) 08/29/2023 1419   GFRAA 59 (L) 11/07/2018 1000   Lab Results  Component Value Date   HGBA1C 8.2 (H) 02/05/2024   HGBA1C 13.4 (H) 03/04/2016   No results found for: "INSULIN " Lab Results  Component Value Date   TSH 1.210 02/05/2024   CBC    Component Value Date/Time   WBC 10.6 02/05/2024 1002   WBC 14.6 (H) 08/29/2023 1419   RBC 4.29 02/05/2024 1002   RBC 4.54 08/29/2023 1419   HGB 12.7 02/05/2024 1002   HCT 39.5 02/05/2024 1002   PLT 336 02/05/2024 1002   MCV 92 02/05/2024 1002   MCH 29.6 02/05/2024 1002   MCH 28.9 08/29/2023 1419   MCHC 32.2 02/05/2024 1002   MCHC 32.4 08/29/2023 1419   RDW 13.1 02/05/2024 1002   Iron Studies No results found for: "IRON", "TIBC", "FERRITIN", "IRONPCTSAT" Lipid Panel     Component Value Date/Time   CHOL 193 02/05/2024 1002   TRIG 76 02/05/2024 1002   HDL 64 02/05/2024 1002   LDLCALC 115 (H) 02/05/2024 1002   Hepatic Function Panel     Component Value Date/Time   PROT 7.2 02/05/2024 1002   ALBUMIN 4.4 02/05/2024 1002   AST 20 02/05/2024 1002   ALT 18 02/05/2024 1002   ALKPHOS 152 (H) 02/05/2024 1002   BILITOT <0.2 02/05/2024 1002      Component Value Date/Time   TSH 1.210  02/05/2024 1002   Nutritional Lab Results  Component Value Date   VD25OH 41.8 02/05/2024    Attestations:   I, Camryn Mix, acting as a Stage manager for [employer], DO., have compiled all relevant documentation for today's office visit on behalf of Marceil Sensor, DO, while in the presence of [employer], DO.  I have reviewed the above documentation for accuracy and completeness, and I agree with the above. Maria [name], D.O.  The 21st Century Cures  Act was signed into law in 2016 which includes the topic of electronic health records.  This provides immediate access to information in MyChart.  This includes consultation notes, operative notes, office notes, lab results and pathology reports.  If you have any questions about what you read please let us  know at your next visit so we can discuss your concerns and take corrective action if need be.  We are right here with you.

## 2024-04-19 ENCOUNTER — Ambulatory Visit
Admission: RE | Admit: 2024-04-19 | Discharge: 2024-04-19 | Disposition: A | Discharge status: Home / Self Care | Source: Ambulatory Visit | Attending: Obstetrics and Gynecology | Admitting: Obstetrics and Gynecology

## 2024-04-19 DIAGNOSIS — Z0000 Encounter for general adult medical examination without abnormal findings: Secondary | ICD-10-CM

## 2024-04-19 DIAGNOSIS — Z1231 Encounter for screening mammogram for malignant neoplasm of breast: Secondary | ICD-10-CM | POA: Diagnosis not present

## 2024-04-22 ENCOUNTER — Ambulatory Visit

## 2024-04-24 ENCOUNTER — Other Ambulatory Visit: Payer: Self-pay | Admitting: Obstetrics and Gynecology

## 2024-04-24 DIAGNOSIS — R928 Other abnormal and inconclusive findings on diagnostic imaging of breast: Secondary | ICD-10-CM

## 2024-04-25 DIAGNOSIS — R6889 Other general symptoms and signs: Secondary | ICD-10-CM | POA: Diagnosis not present

## 2024-04-29 DIAGNOSIS — J4530 Mild persistent asthma, uncomplicated: Secondary | ICD-10-CM | POA: Diagnosis not present

## 2024-04-29 DIAGNOSIS — J40 Bronchitis, not specified as acute or chronic: Secondary | ICD-10-CM | POA: Diagnosis not present

## 2024-04-29 DIAGNOSIS — F17200 Nicotine dependence, unspecified, uncomplicated: Secondary | ICD-10-CM | POA: Diagnosis not present

## 2024-04-29 DIAGNOSIS — J3089 Other allergic rhinitis: Secondary | ICD-10-CM | POA: Diagnosis not present

## 2024-05-01 ENCOUNTER — Ambulatory Visit (INDEPENDENT_AMBULATORY_CARE_PROVIDER_SITE_OTHER): Admitting: Family Medicine

## 2024-05-01 ENCOUNTER — Encounter (INDEPENDENT_AMBULATORY_CARE_PROVIDER_SITE_OTHER): Payer: Self-pay | Admitting: Family Medicine

## 2024-05-01 VITALS — BP 118/77 | HR 74 | Temp 98.7°F | Ht 64.0 in | Wt 181.0 lb

## 2024-05-01 DIAGNOSIS — E1169 Type 2 diabetes mellitus with other specified complication: Secondary | ICD-10-CM | POA: Diagnosis not present

## 2024-05-01 DIAGNOSIS — Z7985 Long-term (current) use of injectable non-insulin antidiabetic drugs: Secondary | ICD-10-CM

## 2024-05-01 DIAGNOSIS — Z7984 Long term (current) use of oral hypoglycemic drugs: Secondary | ICD-10-CM

## 2024-05-01 DIAGNOSIS — Z6831 Body mass index (BMI) 31.0-31.9, adult: Secondary | ICD-10-CM

## 2024-05-01 DIAGNOSIS — E559 Vitamin D deficiency, unspecified: Secondary | ICD-10-CM

## 2024-05-01 DIAGNOSIS — E1159 Type 2 diabetes mellitus with other circulatory complications: Secondary | ICD-10-CM | POA: Diagnosis not present

## 2024-05-01 DIAGNOSIS — I152 Hypertension secondary to endocrine disorders: Secondary | ICD-10-CM

## 2024-05-01 DIAGNOSIS — E669 Obesity, unspecified: Secondary | ICD-10-CM

## 2024-05-01 NOTE — Progress Notes (Addendum)
 Maria [name], D.O.  ABFM, ABOM Specializing in Clinical Bariatric Medicine  Office located at: [address] [place], [place]  [address]   Assessment and Plan:   FOR THE DISEASE OF OBESITY:  BMI 31.0-31.9,adult - Current BMI 31.05 Obesity (BMI 30-39.9), Starting BMI 32.94 Assessment & Plan: Since last office visit on 04/17/2024, patient's muscle mass has decreased by 0.6 lbs. Fat mass has increased by 1.2 lbs. Total body water has increased by 1 lb.  Counseling done on how various foods will affect these numbers and how to maximize success  Total lbs lost to date: 11 lbs Total weight loss percentage to date: 5.73%    Recommended Dietary Goals Maria [name] is currently in the action stage of change. As such, her goal is to continue weight management plan.  She has agreed to: Keep a food journal with [phone] cal and 75++g of protein daily using Category 1 plan as a guide   Behavioral Intervention We discussed the following today: work on tracking and journaling calories using tracking application  Additional resources provided today: Provided patient with guidance and personalized instruction on how to track food intake using tracking application, Handout on Daily Food Journaling Log  Evidence-based interventions for health behavior change were utilized today including the discussion of self monitoring techniques, problem-solving barriers and SMART goal setting techniques.   Regarding patient's less desirable eating habits and patterns, we employed the technique of small changes.     Recommended Physical Activity Goals Maria [name] has been advised to work up to 300-450 minutes of moderate intensity aerobic activity a week and strengthening exercises 2-3 times per week for cardiovascular health, weight loss maintenance and preservation of muscle mass.   She has agreed to: Walk 30 minutes 7 days per week, then gradually increase to 60 minutes   Pharmacotherapy We both  agreed to : continue with nutritional and behavioral strategies and increase mounjaro   ASSOCIATED CONDITIONS ADDRESSED TODAY:  Type 2 diabetes mellitus with obesity ([facility]) Assessment & Plan: Maria [name] is taking Mounjaro 5 mg once weekly for this condition, and Glucotrol  2.5 mg daily for her blood sugars. LOV increased Mounjaro dose to 7.5 mg, but pt is still finishing 5 mg before starting new dose. [facility]i will start new dose in two weeks. Additionally, did CGM analysis using app on pt's phone and fasting sugars have ranged from 130-180. She recently received a steroid shot for allergies which caused blood sugars to greatly increase to 181. Following this level, she took a full tablet of Glucotrol .   Reminded Maria [name] if she feels poorly- check Blood Sugar and Blood Pressure at that time. Hypoglycemia prevention discussed with the patient. Importance of f/up with PCP was stressed to the patient today. Continue following prudent nutrition plan with an increase in protein and decrease in carbs. Will continue to monitor.    Hypertension associated with diabetes [facility]) Assessment & Plan: BP Readings from Last 3 Encounters:  05/01/24 118/77  04/17/24 102/68  03/14/24 (!) 106/55  Relevant medications: Dyazide 37.5-25 mg once daily, Cardizem  LA 360 mg once daily, and Tenormin 25 mg once daily Maria [name] is tolerating all medications well. Informed her that Tenormin is an obesogenic medication. As pt loses weight and BP continues to decrease, encouraged pt to consult with PCP about decreasing dose of Tenormin. Continue current dose at this time. Recommended pt monitor BP levels at home, and contact PCP if levels are consistently low (100/60 or less). Continue low sodium, heart healthy MP.  Will continue to monitor condition closely.    Vitamin D  deficiency Assessment & Plan: Maria [name] is taking Cholecalciferol  2000 units daily. Tolerating well, no adverse SE reported. No concerns with this  condition. Continue current supplementation regimen. Will monitor levels regularly.   Follow up:   Return in about 6 weeks (around 06/10/2024). She was informed of the importance of frequent follow up visits to maximize her success with intensive lifestyle modifications for her multiple health conditions.  Subjective:   Chief complaint: Obesity Maria [name] is here to discuss her progress with her obesity treatment plan. She is on the Category 1 Plan with B/L options and 8 ounces at dinner and states she is following her eating plan approximately 95% of the time. She states she is walking 30 minutes 5 days per week.  Interval History:  Maria [name] is here for a follow up office visit. Since last OV on 04/17/2024, pt's weight has not changed. She has had a few occasions where she got off track with plan, but otherwise pt is closely adhering to MP.   Pharmacotherapy for weight loss: She is currently taking Mounjaro 7.5 mg once weekly.   Review of Systems:  Pertinent positives were addressed with patient today.  Reviewed by clinician on day of visit: allergies, medications, problem list, medical history, surgical history, family history, social history, and previous encounter notes.  Weight Summary and Biometrics   Weight Lost Since Last Visit: 0lb  Weight Gained Since Last Visit: 0lb   Vitals Temp: 98.7 F (37.1 C) BP: 118/77 Pulse Rate: 74 SpO2: 100 %   Anthropometric Measurements Height: 5\' 4"  (1.626 m) Weight: 181 lb (82.1 kg) BMI (Calculated): 31.05 Weight at Last Visit: 181lb Weight Lost Since Last Visit: 0lb Weight Gained Since Last Visit: 0lb Starting Weight: 192lb Total Weight Loss (lbs): 11 lb (4.99 kg) Peak Weight: 192lb   Body Composition  Body Fat %: 43 % Fat Mass (lbs): 78 lbs Muscle Mass (lbs): 98.4 lbs Total Body Water (lbs): 66.4 lbs Visceral Fat Rating : 11   Other Clinical Data Fasting: No Labs: no Today's Visit #: 5 Starting Date:  02/05/24   Objective:   PHYSICAL EXAM: Blood pressure 118/77, pulse 74, temperature 98.7 F (37.1 C), height 5\' 4"  (1.626 m), weight 181 lb (82.1 kg), last menstrual period 05/21/2013, SpO2 100%. Body mass index is 31.07 kg/m.  General: she is overweight, cooperative and in no acute distress. PSYCH: Has normal mood, affect and thought process.   HEENT: EOMI, sclerae are anicteric. Lungs: Normal breathing effort, no conversational dyspnea. Extremities: Moves * 4 Neurologic: A and O * 3, good insight  DIAGNOSTIC DATA REVIEWED: BMET    Component Value Date/Time   NA 143 02/05/2024 1002   K 4.9 02/05/2024 1002   CL 106 02/05/2024 1002   CO2 22 02/05/2024 1002   GLUCOSE 126 (H) 02/05/2024 1002   GLUCOSE 224 (H) 08/29/2023 1419   BUN 18 02/05/2024 1002   CREATININE 1.10 (H) 02/05/2024 1002   CALCIUM  9.5 02/05/2024 1002   GFRNONAA 35 (L) 08/29/2023 1419   GFRAA 59 (L) 11/07/2018 1000   Lab Results  Component Value Date   HGBA1C 8.2 (H) 02/05/2024   HGBA1C 13.4 (H) 03/04/2016   No results found for: "INSULIN " Lab Results  Component Value Date   TSH 1.210 02/05/2024   CBC    Component Value Date/Time   WBC 10.6 02/05/2024 1002   WBC 14.6 (H) 08/29/2023 1419   RBC 4.29 02/05/2024 1002  RBC 4.54 08/29/2023 1419   HGB 12.7 02/05/2024 1002   HCT 39.5 02/05/2024 1002   PLT 336 02/05/2024 1002   MCV 92 02/05/2024 1002   MCH 29.6 02/05/2024 1002   MCH 28.9 08/29/2023 1419   MCHC 32.2 02/05/2024 1002   MCHC 32.4 08/29/2023 1419   RDW 13.1 02/05/2024 1002   Iron Studies No results found for: "IRON", "TIBC", "FERRITIN", "IRONPCTSAT" Lipid Panel     Component Value Date/Time   CHOL 193 02/05/2024 1002   TRIG 76 02/05/2024 1002   HDL 64 02/05/2024 1002   LDLCALC 115 (H) 02/05/2024 1002   Hepatic Function Panel     Component Value Date/Time   PROT 7.2 02/05/2024 1002   ALBUMIN 4.4 02/05/2024 1002   AST 20 02/05/2024 1002   ALT 18 02/05/2024 1002   ALKPHOS 152  (H) 02/05/2024 1002   BILITOT <0.2 02/05/2024 1002      Component Value Date/Time   TSH 1.210 02/05/2024 1002   Nutritional Lab Results  Component Value Date   VD25OH 41.8 02/05/2024    Attestations:   I, Camryn Mix, acting as a Stage manager for [employer], DO., have compiled all relevant documentation for today's office visit on behalf of Maria Sensor, DO, while in the presence of [employer], DO.  I have reviewed the above documentation for accuracy and completeness, and I agree with the above. Maria [name], D.O.  The 21st Century Cures Act was signed into law in 2016 which includes the topic of electronic health records.  This provides immediate access to information in MyChart.  This includes consultation notes, operative notes, office notes, lab results and pathology reports.  If you have any questions about what you read please let us  know at your next visit so we can discuss your concerns and take corrective action if need be.  We are right here with you.

## 2024-05-03 ENCOUNTER — Ambulatory Visit
Admission: RE | Admit: 2024-05-03 | Discharge: 2024-05-03 | Disposition: A | Discharge status: Home / Self Care | Source: Ambulatory Visit | Attending: Obstetrics and Gynecology | Admitting: Obstetrics and Gynecology

## 2024-05-03 DIAGNOSIS — R928 Other abnormal and inconclusive findings on diagnostic imaging of breast: Secondary | ICD-10-CM

## 2024-05-16 DIAGNOSIS — L4050 Arthropathic psoriasis, unspecified: Secondary | ICD-10-CM | POA: Diagnosis not present

## 2024-05-16 DIAGNOSIS — M797 Fibromyalgia: Secondary | ICD-10-CM | POA: Diagnosis not present

## 2024-05-16 DIAGNOSIS — Z79899 Other long term (current) drug therapy: Secondary | ICD-10-CM | POA: Diagnosis not present

## 2024-05-16 DIAGNOSIS — M1990 Unspecified osteoarthritis, unspecified site: Secondary | ICD-10-CM | POA: Diagnosis not present

## 2024-05-24 DIAGNOSIS — K2100 Gastro-esophageal reflux disease with esophagitis, without bleeding: Secondary | ICD-10-CM | POA: Diagnosis not present

## 2024-05-24 DIAGNOSIS — J4541 Moderate persistent asthma with (acute) exacerbation: Secondary | ICD-10-CM | POA: Diagnosis not present

## 2024-05-24 DIAGNOSIS — E782 Mixed hyperlipidemia: Secondary | ICD-10-CM | POA: Diagnosis not present

## 2024-05-24 DIAGNOSIS — J3089 Other allergic rhinitis: Secondary | ICD-10-CM | POA: Diagnosis not present

## 2024-05-24 DIAGNOSIS — I119 Hypertensive heart disease without heart failure: Secondary | ICD-10-CM | POA: Diagnosis not present

## 2024-05-24 DIAGNOSIS — M05649 Rheumatoid arthritis of unspecified hand with involvement of other organs and systems: Secondary | ICD-10-CM | POA: Diagnosis not present

## 2024-05-24 DIAGNOSIS — E1122 Type 2 diabetes mellitus with diabetic chronic kidney disease: Secondary | ICD-10-CM | POA: Diagnosis not present

## 2024-05-26 DIAGNOSIS — R6889 Other general symptoms and signs: Secondary | ICD-10-CM | POA: Diagnosis not present

## 2024-05-28 ENCOUNTER — Telehealth (INDEPENDENT_AMBULATORY_CARE_PROVIDER_SITE_OTHER): Payer: Self-pay | Admitting: Family Medicine

## 2024-05-28 ENCOUNTER — Telehealth (INDEPENDENT_AMBULATORY_CARE_PROVIDER_SITE_OTHER): Payer: Self-pay | Admitting: Physician Assistant

## 2024-05-28 NOTE — Telephone Encounter (Signed)
 Patient returned call and she has taken 2 doses of the Mounjaro. She took it the Wednesday before last and last week, she has not taken any this week. She began having some symptoms on Saturday with no appetite. However today, she has been having some N&V with diarrhea. She also stated has a fever, per patient her temperature is 98.0. She has tried to eat yogurt and she could only get a few swallows down. Then patient stated she went to see another provider and they did labs on her and told her she was not drinking enough water. She also just finished taking a Zpack. I informed patient I would relay the message to [facility] and she should also reach out to her PCP since she was also having fever.

## 2024-05-28 NOTE — Telephone Encounter (Signed)
 Pt reports she is experiencing some symptoms after taking her Mounjaro 7.5. Pt reports she has nausea, vomiting, complete loss of appetite and a fever. Pt would like some to call her because she believes the dose is too high. Please follow up with the patient.

## 2024-06-03 ENCOUNTER — Other Ambulatory Visit (INDEPENDENT_AMBULATORY_CARE_PROVIDER_SITE_OTHER): Payer: Self-pay | Admitting: Family Medicine

## 2024-06-10 ENCOUNTER — Ambulatory Visit (INDEPENDENT_AMBULATORY_CARE_PROVIDER_SITE_OTHER): Admitting: Family Medicine

## 2024-06-10 ENCOUNTER — Encounter (INDEPENDENT_AMBULATORY_CARE_PROVIDER_SITE_OTHER): Payer: Self-pay | Admitting: Family Medicine

## 2024-06-10 VITALS — BP 121/72 | HR 74 | Temp 98.8°F | Ht 64.0 in | Wt 176.0 lb

## 2024-06-10 DIAGNOSIS — I152 Hypertension secondary to endocrine disorders: Secondary | ICD-10-CM

## 2024-06-10 DIAGNOSIS — E559 Vitamin D deficiency, unspecified: Secondary | ICD-10-CM | POA: Diagnosis not present

## 2024-06-10 DIAGNOSIS — E1159 Type 2 diabetes mellitus with other circulatory complications: Secondary | ICD-10-CM

## 2024-06-10 DIAGNOSIS — E1169 Type 2 diabetes mellitus with other specified complication: Secondary | ICD-10-CM

## 2024-06-10 DIAGNOSIS — Z6830 Body mass index (BMI) 30.0-30.9, adult: Secondary | ICD-10-CM

## 2024-06-10 DIAGNOSIS — Z7985 Long-term (current) use of injectable non-insulin antidiabetic drugs: Secondary | ICD-10-CM

## 2024-06-10 DIAGNOSIS — E669 Obesity, unspecified: Secondary | ICD-10-CM

## 2024-06-10 MED ORDER — TIRZEPATIDE 7.5 MG/0.5ML ~~LOC~~ SOAJ: 7.5000 mg | SUBCUTANEOUS | 1 refills | Status: DC

## 2024-06-10 NOTE — Progress Notes (Signed)
 Maria [name], D.O.  ABFM, ABOM Specializing in Clinical Bariatric Medicine  Office located at: [address] [place], [place]  [address]   Assessment and Plan:  No orders of the defined types were placed in this encounter.  Medications Discontinued During This Encounter  Medication Reason   tirzepatide  (MOUNJARO ) 5 MG/0.5ML Pen    tirzepatide  (MOUNJARO ) 7.5 MG/0.5ML Pen Reorder    Meds ordered this encounter  Medications   tirzepatide  (MOUNJARO ) 7.5 MG/0.5ML Pen    Sig: Inject 7.5 mg into the skin once a week.    Dispense:  2 mL    Refill:  1    Please fill once her 5mg  script is complete.      FOR THE DISEASE OF OBESITY: BMI 30.0-30.9,adult -- Current BMI 30.2 Obesity (BMI 30-39.9), Starting BMI 32.94 Assessment & Plan: Since last office visit on 05/01/24 patient's muscle mass has decreased by 2.6 lbs. Fat mass has decreased by 2.6 lbs. Total body water has decreased by 0.6 lbs.  Counseling done on how various foods will affect these numbers and how to maximize success  Total lbs lost to date: 16 lbs Total weight loss percentage to date: -8.33%    Recommended Dietary Goals Dolly is currently in the action stage of change. As such, her goal is to continue weight management plan.  She has agreed to:  Slightly modify journal parameters --> Keep a food journal with 1000 cal and 80++g of protein daily using Category 1 plan as a guide    Behavioral Intervention We discussed the following today: increasing lean protein intake to established goals, work on tracking and journaling calories using tracking application, decreasing eating out or consumption of processed foods, and making healthy choices when eating convenient foods, and planning for success  Additional resources provided today: Handout on Daily Food Journaling Log  Evidence-based interventions for health behavior change were utilized today including the discussion of self monitoring techniques,  problem-solving barriers and SMART goal setting techniques.   Regarding patient's less desirable eating habits and patterns, we employed the technique of small changes.   Pt will specifically work on: n/a   Recommended Physical Activity Goals Tykia has been advised to work up to 300-450 minutes of moderate intensity aerobic activity a week and strengthening exercises 2-3 times per week for cardiovascular health, weight loss maintenance and preservation of muscle mass.   She has agreed to :  Increase the intensity, frequency or duration of aerobic exercises   and continue to gradually increase the amount and intensity of exercise routine   Pharmacotherapy We both agreed to: Continue with current nutritional and behavioral strategies and Has declined pharmacotherapy   ASSOCIATED CONDITIONS ADDRESSED TODAY: Type 2 diabetes mellitus with obesity (HCC) Assessment & Plan: Lab Results  Component Value Date   HGBA1C 8.2 (H) 02/05/2024   HGBA1C 13.4 (H) 03/04/2016    Most recent A1c is above goal. Compliant with Mounjaro  5 mg once weekly and sporadically taking Glipizide  2.5 mg daily. Tolerating well, no SE. Sugars are running 150-160s with occasional readings in 100-110s. Per pt, her endocrinologist obtained labs including an A1c at the end of May. Pt agrees to bring her results to her next visit. Discuss the option of increasing Mounjaro  for better control of blood sugars. Will increase Mounjaro  to 7.5 mg weekly. Reviewed clinical benefits as well as potential risks/side effects. Patient verbalized understanding/agreement to this plan.  Encourage her to continue best efforts towards following her meal plan. Will continue  monitoring alongside PCP and specialist.  Orders: - INCREASE Mounjaro  to 7.5 mg weekly   Hypertension associated with diabetes [facility]) Assessment & Plan: BP Readings from Last 3 Encounters:  06/10/24 121/72  05/01/24 118/77  04/17/24 102/68   Complaint with Atenolol  25 mg once daily, Diltiazem  360 mg daily, Dyazide 37.5-25 mg once daily and hydroxyzine 25 TID as needed. Tolerating all antihypertensives well with no SE.  Pt reports her BP runs 120s/70s at home. Continue with current antihypertensive regimen. Reviewed her blood pressures are expected to improve as she loses weight. If blood pressures continues to decrease we may consider stopping Atenolol. Continue Best efforts towards following heart, healthy diet per her prudent nutritional meal plan. Continue monitoring.   Vitamin D  deficiency Assessment & Plan: Lab Results  Component Value Date   VD25OH 41.8 02/05/2024   Complaint with OTC vit D 2,000 units daily. Tolerating well, no SE. Continue with current supplementation. No refill required. Recheck periodically.   Follow up:   Return in about 3 weeks (around 07/01/2024) for Keep upcoming appt on 7/7, make another appt if pt desires. She was informed of the importance of frequent follow up visits to maximize her success with intensive lifestyle modifications for her multiple health conditions.  Subjective:   Chief complaint: Obesity Maria [name] is here to discuss her progress with her obesity treatment plan. Keep a food journal with [phone] cal and 75++g of protein daily using Category 1 plan as a guide and states she is following her eating plan approximately 75% of the time. She states she is walking 30 minutes 7 days per week.  Interval History:  Maria [name] is here for a follow up office visit. Since last OV on 05/01/24, she lost 5 lbs. She does not believe her 8 ounces of protein at dinner is enough to control hunger she experiences afterwards. She is interested in adding more protein to her dinner. She has also been eating [place] bacon at breakfast. Typically eats a sandwich and an apple for lunch.    Pharmacotherapy for weight loss: She is currently taking Mounjaro  5 mg weekly. .   Review of Systems:  Pertinent positives were  addressed with patient today.  Reviewed by clinician on day of visit: allergies, medications, problem list, medical history, surgical history, family history, social history, and previous encounter notes.  Weight Summary and Biometrics   Weight Lost Since Last Visit: 5lb  Weight Gained Since Last Visit: 0lb   Vitals Temp: 98.8 F (37.1 C) BP: 121/72 Pulse Rate: 74 SpO2: 100 %   Anthropometric Measurements Height: 5' 4 (1.626 m) Weight: 176 lb (79.8 kg) BMI (Calculated): 30.2 Weight at Last Visit: 181lb Weight Lost Since Last Visit: 5lb Weight Gained Since Last Visit: 0lb Starting Weight: 192lb Total Weight Loss (lbs): 16 lb (7.258 kg) Peak Weight: 192lb   Body Composition  Body Fat %: 42.8 % Fat Mass (lbs): 75.4 lbs Muscle Mass (lbs): 95.8 lbs Total Body Water (lbs): 65.8 lbs Visceral Fat Rating : 11   Other Clinical Data Fasting: No Labs: no Today's Visit #: 6 Starting Date: 02/05/24    Objective:   PHYSICAL EXAM: Blood pressure 121/72, pulse 74, temperature 98.8 F (37.1 C), height 5' 4 (1.626 m), weight 176 lb (79.8 kg), last menstrual period 05/21/2013, SpO2 100%. Body mass index is 30.21 kg/m.  General: she is overweight, cooperative and in no acute distress. PSYCH: Has normal mood, affect and thought process.   HEENT: EOMI, sclerae are anicteric. Lungs:  Normal breathing effort, no conversational dyspnea. Extremities: Moves * 4 Neurologic: A and O * 3, good insight  DIAGNOSTIC DATA REVIEWED: BMET    Component Value Date/Time   NA 143 02/05/2024 1002   K 4.9 02/05/2024 1002   CL 106 02/05/2024 1002   CO2 22 02/05/2024 1002   GLUCOSE 126 (H) 02/05/2024 1002   GLUCOSE 224 (H) 08/29/2023 1419   BUN 18 02/05/2024 1002   CREATININE 1.10 (H) 02/05/2024 1002   CALCIUM  9.5 02/05/2024 1002   GFRNONAA 35 (L) 08/29/2023 1419   GFRAA 59 (L) 11/07/2018 1000   Lab Results  Component Value Date   HGBA1C 8.2 (H) 02/05/2024   HGBA1C 13.4 (H)  03/04/2016   No results found for: INSULIN  Lab Results  Component Value Date   TSH 1.210 02/05/2024   CBC    Component Value Date/Time   WBC 10.6 02/05/2024 1002   WBC 14.6 (H) 08/29/2023 1419   RBC 4.29 02/05/2024 1002   RBC 4.54 08/29/2023 1419   HGB 12.7 02/05/2024 1002   HCT 39.5 02/05/2024 1002   PLT 336 02/05/2024 1002   MCV 92 02/05/2024 1002   MCH 29.6 02/05/2024 1002   MCH 28.9 08/29/2023 1419   MCHC 32.2 02/05/2024 1002   MCHC 32.4 08/29/2023 1419   RDW 13.1 02/05/2024 1002   Iron Studies No results found for: IRON, TIBC, FERRITIN, IRONPCTSAT Lipid Panel     Component Value Date/Time   CHOL 193 02/05/2024 1002   TRIG 76 02/05/2024 1002   HDL 64 02/05/2024 1002   LDLCALC 115 (H) 02/05/2024 1002   Hepatic Function Panel     Component Value Date/Time   PROT 7.2 02/05/2024 1002   ALBUMIN 4.4 02/05/2024 1002   AST 20 02/05/2024 1002   ALT 18 02/05/2024 1002   ALKPHOS 152 (H) 02/05/2024 1002   BILITOT <0.2 02/05/2024 1002      Component Value Date/Time   TSH 1.210 02/05/2024 1002   Nutritional Lab Results  Component Value Date   VD25OH 41.8 02/05/2024    Attestations:   LILLETTE Vernell Forest, acting as a medical scribe for Maria Jenkins, DO., have compiled all relevant documentation for today's office visit on behalf of Maria Jenkins, DO, while in the presence of [employer], DO.  Reviewed by clinician on day of visit: allergies, medications, problem list, medical history, surgical history, family history, social history, and previous encounter notes pertinent to patient's obesity diagnosis.  I have reviewed the above documentation for accuracy and completeness, and I agree with the above. Maria JINNY [name], D.O.  The 21st Century Cures Act was signed into law in 2016 which includes the topic of electronic health records.  This provides immediate access to information in MyChart.  This includes consultation notes, operative notes, office  notes, lab results and pathology reports.  If you have any questions about what you read please let us  know at your next visit so we can discuss your concerns and take corrective action if need be.  We are right here with you.

## 2024-06-18 DIAGNOSIS — Z01419 Encounter for gynecological examination (general) (routine) without abnormal findings: Secondary | ICD-10-CM | POA: Diagnosis not present

## 2024-07-01 ENCOUNTER — Ambulatory Visit (INDEPENDENT_AMBULATORY_CARE_PROVIDER_SITE_OTHER): Admitting: Family Medicine

## 2024-07-01 ENCOUNTER — Encounter (INDEPENDENT_AMBULATORY_CARE_PROVIDER_SITE_OTHER): Payer: Self-pay | Admitting: Family Medicine

## 2024-07-01 VITALS — BP 117/76 | HR 78 | Temp 98.7°F | Ht 64.0 in | Wt 174.0 lb

## 2024-07-01 DIAGNOSIS — E1159 Type 2 diabetes mellitus with other circulatory complications: Secondary | ICD-10-CM | POA: Diagnosis not present

## 2024-07-01 DIAGNOSIS — E1169 Type 2 diabetes mellitus with other specified complication: Secondary | ICD-10-CM | POA: Diagnosis not present

## 2024-07-01 DIAGNOSIS — E559 Vitamin D deficiency, unspecified: Secondary | ICD-10-CM | POA: Diagnosis not present

## 2024-07-01 DIAGNOSIS — I152 Hypertension secondary to endocrine disorders: Secondary | ICD-10-CM | POA: Diagnosis not present

## 2024-07-01 DIAGNOSIS — Z7984 Long term (current) use of oral hypoglycemic drugs: Secondary | ICD-10-CM

## 2024-07-01 DIAGNOSIS — Z6829 Body mass index (BMI) 29.0-29.9, adult: Secondary | ICD-10-CM

## 2024-07-01 DIAGNOSIS — E669 Obesity, unspecified: Secondary | ICD-10-CM

## 2024-07-01 DIAGNOSIS — Z7985 Long-term (current) use of injectable non-insulin antidiabetic drugs: Secondary | ICD-10-CM

## 2024-07-01 MED ORDER — TIRZEPATIDE 7.5 MG/0.5ML ~~LOC~~ SOAJ: 7.5000 mg | SUBCUTANEOUS | 0 refills | Status: DC

## 2024-07-01 NOTE — Progress Notes (Signed)
 Maria [name], D.O.  ABFM, ABOM Specializing in Clinical Bariatric Medicine  Office located at: [address] [place], [place]  [address]   Assessment and Plan:  No orders of the defined types were placed in this encounter.   Medications Discontinued During This Encounter  Medication Reason   tirzepatide  (MOUNJARO ) 7.5 MG/0.5ML Pen Reorder     Meds ordered this encounter  Medications   tirzepatide  (MOUNJARO ) 7.5 MG/0.5ML Pen    Sig: Inject 7.5 mg into the skin once a week.    Dispense:  2 mL    Refill:  0    Please fill once her 5mg  script is complete.     Pt instructed to bring a copy of labs obtained with PCP/rheum to next OV.   Consider rechecking VD next OV    FOR THE DISEASE OF OBESITY:  ***  Since last office visit on 06/10/2024 patient's muscle mass has increased by 0.2 lbs. Fat mass has decreased by 2.4 lbs. Total body water has increased by 0.4 lbs.  Counseling done on how various foods will affect these numbers and how to maximize success  Total lbs lost to date: 18 lbs  Total weight loss percentage to date: 9.38%    Recommended Dietary Goals Maria [name] is currently in the action stage of change. As such, her goal is to continue weight management plan.  She has agreed to: continue current plan   Behavioral Intervention We extensively discussed the following today: increasing lean protein intake to established goals, increasing water intake , and celebration eating strategies  Additional resources provided today: {[id number]::None}  Evidence-based interventions for health behavior change were utilized today including the discussion of self monitoring techniques, problem-solving barriers and SMART goal setting techniques.   Regarding patient's less desirable eating habits and patterns, we employed the technique of small changes.   Pt will specifically work on: n/a   Recommended Physical Activity Goals Maria [name] has been advised to work  up to 300-450 minutes of moderate intensity aerobic activity a week and strengthening exercises 2-3 times per week for cardiovascular health, weight loss maintenance and preservation of muscle mass.   She has agreed to: continue to gradually increase the amount and intensity of exercise routine   Pharmacotherapy We both agreed to continue same regimen.    ASSOCIATED CONDITIONS ADDRESSED TODAY:   Type 2 diabetes mellitus with obesity (HCC) Assessment & Plan: Lab Results  Component Value Date   HGBA1C 8.2 (H) 02/05/2024   HGBA1C 13.4 (H) 03/04/2016    HgbA1c is not at goal for age and comorbid conditions. Denies symptoms of hypoglycemia or hyperglycemia. On Mounjaro  7.5 mg weekly and Glipizide  2.5 mg daily with good adherence and no side effects.   Pt instructed to bring a copy of labs obtained with PCP/rheum to next OV. Continue with journaling plan with an emphasis on increasing lean proteins and decreasing simple carbohydrates. Ongoing reduction of adipose tissue  will improve insulin  resistance, Hemoglobin A1c, and glycemic control.    Hypertension associated with diabetes [name]) Assessment & Plan: Last 3 blood pressure readings in our office are as follows: BP Readings from Last 3 Encounters:  07/01/24 117/76  06/10/24 121/72  05/01/24 118/77   The 10-year ASCVD risk score (Arnett DK, et al., 2019) is: 22%  Lab Results  Component Value Date   CREATININE 1.10 (H) 02/05/2024   Blood pressure is controlled on Atenolol 25 mg daily, Ditiazem 360 mg daily, and Triamterene-hydrochlorothiazide  37.5-25 mg daily.  Continue medication. Losing 10% of body weight may improve blood pressure control     Vitamin D  deficiency Assessment & Plan: Lab Results  Component Value Date   VD25OH 41.8 02/05/2024   Pt reports good compliance and tolerance of OTC cholecalciferol  2,000 units daily. Continue regimen and weight loss efforts. Consider rechecking levels next OV.     .  Maintain with current regimen. Recheck-future OR Recheck levels today.   Pt is currently on *** without adverse SE. Continue supplement. Recheck periodically OR Recheck levels today.   Pt is on prescription high dose vitamin D   weekly without any complications. Continue vitamin D  supplementation - recheck levels in the future OR recheck today.   Currently on *** without any adverse effects such as nausea, vomiting or muscle weakness.   Deficiency state associated with adiposity and may result in leptin resistance, weight gain and fatigue. After discussion of benefits, alternative treatment options and side effects patient will be started on  ***  Her Vitamin D  levels are not at goal of 50 to 70.   Currently no signs or symptoms of hypervitaminosis D   Low vitamin D  levels can be associated with adiposity and may result in leptin resistance and weight gain. Also associated with fatigue.   Pt has been instructed to initiate ***  Pt is agreeable to starting ***  I discussed the importance of vitamin D  to the patient's health and well-being as well as to their ability to lose weight.            Follow up:   No follow-ups on file. She was informed of the importance of frequent follow up visits to maximize her success with intensive lifestyle modifications for her multiple health conditions.   Subjective:   Chief complaint: Obesity Maria [name] is here to discuss her progress with her obesity treatment plan. She is keeping a food journal and adhering to recommended goals of 1000 calories and 80++ protein daily and states she is following her eating plan approximately 95% of the time. She states she is walking 30 minutes 7 days per week.   Interval History:  Maria [name] is here for a follow up office visit. Since last OV on 06/10/2024 , she is down 2 lbs. Endorses she has been experiencing various problems (e.g car issues & her best friend leaving work) and therefore did  bad with her eating habits. Is drinking 94 ounces of water daily. She would like to specifically learn celebration strategies today.    Pharmacotherapy that aid with weight loss: She is currently taking Mounjaro  7.5 mg weekly.    Review of Systems:  Pertinent positives were addressed with patient today.  Reviewed by clinician on day of visit: allergies, medications, problem list, medical history, surgical history, family history, social history, and previous encounter notes.  Weight Summary and Biometrics   Weight Lost Since Last Visit: 2lb  Weight Gained Since Last Visit: 0lb   Vitals Temp: 98.7 F (37.1 C) BP: 117/76 Pulse Rate: 78 SpO2: 98 %   Anthropometric Measurements Height: 5' 4 (1.626 m) Weight: 174 lb (78.9 kg) BMI (Calculated): 29.85 Weight at Last Visit: 176lb Weight Lost Since Last Visit: 2lb Weight Gained Since Last Visit: 0lb Starting Weight: 192lb Total Weight Loss (lbs): 18 lb (8.165 kg) Peak Weight: 192lb   Body Composition  Body Fat %: 41.9 % Fat Mass (lbs): 73 lbs Muscle Mass (lbs): 96 lbs Total Body Water (lbs): 66.2 lbs Visceral Fat Rating : 11  Other Clinical Data Fasting: No Labs: No Today's Visit #: 7 Starting Date: 02/05/24 Comments: Cat 1/j 1000/80+    Objective:   PHYSICAL EXAM: Blood pressure 117/76, pulse 78, temperature 98.7 F (37.1 C), height 5' 4 (1.626 m), weight 174 lb (78.9 kg), last menstrual period 05/21/2013, SpO2 98%. Body mass index is 29.87 kg/m.  General: she is overweight, cooperative and in no acute distress. PSYCH: Has normal mood, affect and thought process.   HEENT: EOMI, sclerae are anicteric. Lungs: Normal breathing effort, no conversational dyspnea. Extremities: Moves * 4 Neurologic: A and O * 3, good insight  DIAGNOSTIC DATA REVIEWED: BMET    Component Value Date/Time   NA 143 02/05/2024 1002   K 4.9 02/05/2024 1002   CL 106 02/05/2024 1002   CO2 22 02/05/2024 1002   GLUCOSE 126 (H)  02/05/2024 1002   GLUCOSE 224 (H) 08/29/2023 1419   BUN 18 02/05/2024 1002   CREATININE 1.10 (H) 02/05/2024 1002   CALCIUM  9.5 02/05/2024 1002   GFRNONAA 35 (L) 08/29/2023 1419   GFRAA 59 (L) 11/07/2018 1000   Lab Results  Component Value Date   HGBA1C 8.2 (H) 02/05/2024   HGBA1C 13.4 (H) 03/04/2016   No results found for: INSULIN  Lab Results  Component Value Date   TSH 1.210 02/05/2024   CBC    Component Value Date/Time   WBC 10.6 02/05/2024 1002   WBC 14.6 (H) 08/29/2023 1419   RBC 4.29 02/05/2024 1002   RBC 4.54 08/29/2023 1419   HGB 12.7 02/05/2024 1002   HCT 39.5 02/05/2024 1002   PLT 336 02/05/2024 1002   MCV 92 02/05/2024 1002   MCH 29.6 02/05/2024 1002   MCH 28.9 08/29/2023 1419   MCHC 32.2 02/05/2024 1002   MCHC 32.4 08/29/2023 1419   RDW 13.1 02/05/2024 1002   Iron Studies No results found for: IRON, TIBC, FERRITIN, IRONPCTSAT Lipid Panel     Component Value Date/Time   CHOL 193 02/05/2024 1002   TRIG 76 02/05/2024 1002   HDL 64 02/05/2024 1002   LDLCALC 115 (H) 02/05/2024 1002   Hepatic Function Panel     Component Value Date/Time   PROT 7.2 02/05/2024 1002   ALBUMIN 4.4 02/05/2024 1002   AST 20 02/05/2024 1002   ALT 18 02/05/2024 1002   ALKPHOS 152 (H) 02/05/2024 1002   BILITOT <0.2 02/05/2024 1002      Component Value Date/Time   TSH 1.210 02/05/2024 1002   Nutritional Lab Results  Component Value Date   VD25OH 41.8 02/05/2024    Attestations:   I, Special Puri, acting as a Stage manager for [employer], DO., have compiled all relevant documentation for today's office visit on behalf of Maria Jenkins, DO, while in the presence of [employer], DO.  Reviewed by clinician on day of visit: allergies, medications, problem list, medical history, surgical history, family history, social history, and previous encounter notes pertinent to patient's obesity diagnosis.  I have spent X minutes in the care of the patient  today including: preparing to see patient (e.g. review and interpretation of tests, old notes ), obtaining and/or reviewing separately obtained history, performing a medically appropriate examination or evaluation, counseling and educating the patient, ordering medications, test or procedures, documenting clinical information in the electronic or other health care record, and independently interpreting results and communicating results to the patient, family, or caregiver   I have reviewed the above documentation for accuracy and completeness, and I agree with the above. Maria PARAS  , D.O.  The 21st Century Cures Act was signed into law in 2016 which includes the topic of electronic health records.  This provides immediate access to information in MyChart.  This includes consultation notes, operative notes, office notes, lab results and pathology reports.  If you have any questions about what you read please let us  know at your next visit so we can discuss your concerns and take corrective action if need be.  We are right here with you.

## 2024-07-23 ENCOUNTER — Ambulatory Visit (INDEPENDENT_AMBULATORY_CARE_PROVIDER_SITE_OTHER): Admitting: Family Medicine

## 2024-07-25 DIAGNOSIS — K2100 Gastro-esophageal reflux disease with esophagitis, without bleeding: Secondary | ICD-10-CM | POA: Diagnosis not present

## 2024-07-25 DIAGNOSIS — M62838 Other muscle spasm: Secondary | ICD-10-CM | POA: Diagnosis not present

## 2024-07-25 DIAGNOSIS — N1832 Chronic kidney disease, stage 3b: Secondary | ICD-10-CM | POA: Diagnosis not present

## 2024-07-25 DIAGNOSIS — N1831 Chronic kidney disease, stage 3a: Secondary | ICD-10-CM | POA: Diagnosis not present

## 2024-07-25 DIAGNOSIS — M1009 Idiopathic gout, multiple sites: Secondary | ICD-10-CM | POA: Diagnosis not present

## 2024-07-25 DIAGNOSIS — E782 Mixed hyperlipidemia: Secondary | ICD-10-CM | POA: Diagnosis not present

## 2024-07-25 DIAGNOSIS — I119 Hypertensive heart disease without heart failure: Secondary | ICD-10-CM | POA: Diagnosis not present

## 2024-07-25 DIAGNOSIS — J4541 Moderate persistent asthma with (acute) exacerbation: Secondary | ICD-10-CM | POA: Diagnosis not present

## 2024-07-25 DIAGNOSIS — M05649 Rheumatoid arthritis of unspecified hand with involvement of other organs and systems: Secondary | ICD-10-CM | POA: Diagnosis not present

## 2024-07-25 DIAGNOSIS — E1122 Type 2 diabetes mellitus with diabetic chronic kidney disease: Secondary | ICD-10-CM | POA: Diagnosis not present

## 2024-07-25 LAB — CBC: RBC: 4.19 (ref 3.87–5.11)

## 2024-07-25 LAB — HEPATIC FUNCTION PANEL
ALT: 20 U/L (ref 7–35)
AST: 19 (ref 13–35)
Alkaline Phosphatase: 101 (ref 25–125)
Bilirubin, Total: 0.1

## 2024-07-25 LAB — CBC AND DIFFERENTIAL
HCT: 39 (ref 36–46)
Hemoglobin: 12.5 (ref 12.0–16.0)
Neutrophils Absolute: 4656
Platelets: 308 K/uL (ref 150–400)

## 2024-07-25 LAB — HEMOGLOBIN A1C: Hemoglobin A1C: 6.9

## 2024-07-25 LAB — COMPREHENSIVE METABOLIC PANEL WITH GFR
Albumin: 4.4 (ref 3.5–5.0)
Calcium: 9.4 (ref 8.7–10.7)
Globulin: 2.7
eGFR: 35

## 2024-07-25 LAB — BASIC METABOLIC PANEL WITH GFR
BUN: 34 — AB (ref 4–21)
CO2: 22 (ref 13–22)
Chloride: 112 — AB (ref 99–108)
Creatinine: 1.7 — AB (ref 0.5–1.1)
Potassium: 4.8 meq/L (ref 3.5–5.1)
Sodium: 141 (ref 137–147)

## 2024-08-01 LAB — LAB REPORT - SCANNED
A1c: 6.9
EGFR: 35

## 2024-08-06 ENCOUNTER — Ambulatory Visit (INDEPENDENT_AMBULATORY_CARE_PROVIDER_SITE_OTHER): Admitting: Family Medicine

## 2024-08-06 ENCOUNTER — Encounter (INDEPENDENT_AMBULATORY_CARE_PROVIDER_SITE_OTHER): Payer: Self-pay | Admitting: Family Medicine

## 2024-08-06 VITALS — BP 109/72 | HR 67 | Temp 98.1°F | Ht 64.0 in | Wt 170.0 lb

## 2024-08-06 DIAGNOSIS — E559 Vitamin D deficiency, unspecified: Secondary | ICD-10-CM | POA: Diagnosis not present

## 2024-08-06 DIAGNOSIS — E669 Obesity, unspecified: Secondary | ICD-10-CM

## 2024-08-06 DIAGNOSIS — Z7984 Long term (current) use of oral hypoglycemic drugs: Secondary | ICD-10-CM

## 2024-08-06 DIAGNOSIS — Z7985 Long-term (current) use of injectable non-insulin antidiabetic drugs: Secondary | ICD-10-CM

## 2024-08-06 DIAGNOSIS — E1169 Type 2 diabetes mellitus with other specified complication: Secondary | ICD-10-CM

## 2024-08-06 DIAGNOSIS — Z6829 Body mass index (BMI) 29.0-29.9, adult: Secondary | ICD-10-CM

## 2024-08-06 DIAGNOSIS — I152 Hypertension secondary to endocrine disorders: Secondary | ICD-10-CM | POA: Diagnosis not present

## 2024-08-06 DIAGNOSIS — E1159 Type 2 diabetes mellitus with other circulatory complications: Secondary | ICD-10-CM

## 2024-08-06 MED ORDER — TIRZEPATIDE 7.5 MG/0.5ML ~~LOC~~ SOAJ
7.5000 mg | SUBCUTANEOUS | 0 refills | Status: DC
Start: 2024-08-06 — End: 2024-09-04

## 2024-08-06 NOTE — Progress Notes (Signed)
 Maria DOROTHA [name], D.O.  ABFM, ABOM Specializing in Clinical Bariatric Medicine  Office located at: [address] [place], [place]  [address]   Assessment and Plan:   Orders Placed This Encounter  Procedures   VITAMIN D  25 Hydroxy (Vit-D Deficiency, Fractures)    Medications Discontinued During This Encounter  Medication Reason   tirzepatide  (MOUNJARO ) 7.5 MG/0.5ML Pen Reorder     Meds ordered this encounter  Medications   tirzepatide  (MOUNJARO ) 7.5 MG/0.5ML Pen    Sig: Inject 7.5 mg into the skin once a week.    Dispense:  2 mL    Refill:  0    Please fill once her 5mg  script is complete.     Pt states she had labs with her PCP (CBC, A1c, CMP, among others) on July 31st; pt instructed to bring hard copy of these labs to next OV.    FOR THE DISEASE OF OBESITY:  BMI 29.0-29.9,adult current 29.17 Obesity (BMI 30-39.9), Starting BMI 32.96 Assessment & Plan: Since last office visit on 07/01/2024 patient's muscle mass has decreased by 0.2 lbs. Fat mass has decreased by 3.8 lbs. Total body water has decreased by 0.6 lbs.  Body fat % has decreased by 1.3%. Counseling done on how various foods will affect these numbers and how to maximize success  Total lbs lost to date: -22  lbs Total weight loss percentage to date: -11.46 %   Recommended Dietary Goals Maria [name] is currently in the action stage of change. As such, her goal is to continue weight management plan.  She has agreed to: continue current plan   Behavioral Intervention We discussed the following today: increasing lean protein intake to established goals, work on tracking and journaling calories using tracking application, and continue to practice mindfulness when eating  Additional resources provided today: Handout on Daily Food Journaling Log  Evidence-based interventions for health behavior change were utilized today including the discussion of self monitoring techniques, problem-solving barriers and SMART  goal setting techniques.   Regarding patient's less desirable eating habits and patterns, we employed the technique of small changes.   Pt will specifically work on: n/a   Recommended Physical Activity Goals Maria [name] has been advised to work up to 300-450 minutes of moderate intensity aerobic activity a week and strengthening exercises 2-3 times per week for cardiovascular health, weight loss maintenance and preservation of muscle mass.   Goal: walk at least 30 minutes daily for stress management   Pharmacotherapy See T2DM note   ASSOCIATED CONDITIONS ADDRESSED TODAY:   Type 2 diabetes mellitus with obesity (HCC) Assessment & Plan: Lab Results  Component Value Date   HGBA1C 8.2 (H) 02/05/2024   HGBA1C 13.4 (H) 03/04/2016    HgbA1c is poorly controlled for age and comorbid conditions. Goal <7. Pt states she did have an A1c obtained with her PCP around July 31st but I cannot see these results. Her fasting blood sugars average in the 70s-120s or so; she is completely asymptomatic. On Mounjaro  7.5 mg weekly and Glipizide  2.5 mg daily with good adherence and no side effects. Denies excessive hunger and cravings; she is able to get in all her foods.   - Continue same regimen.  - Reviewed normal fasting blood sugars for a diabetic. - Continue with journaling plan with an emphasis on increasing lean proteins and decreasing simple carbohydrates. - Pt instructed to bring a copy of labs obtained with PCP to next OV     Hypertension associated with diabetes [facility]) Assessment &  Plan: Last 3 blood pressure readings in our office are as follows: BP Readings from Last 3 Encounters:  08/06/24 109/72  07/01/24 117/76  06/10/24 121/72   The 10-year ASCVD risk score (Arnett DK, et al., 2019) is: 18.4%  Lab Results  Component Value Date   CREATININE 1.10 (H) 02/05/2024   On Atenolol 25 mg daily, Ditiazem 360 mg daily, and Triamterene-hydrochlorothiazide 37.5-25 mg daily with reported good  compliance and tolerance. Blood pressure is at goal today. States her blood pressure has been well-controlled at home too. No acute concerns.   - Continue all medications per cardiology; she was instructed to contact cardiology if her BP is 100/60 or less on a regular basis.  - Continue with low sodium diet. - Advance exercise.    Vitamin D  deficiency Assessment & Plan: Lab Results  Component Value Date   VD25OH 41.8 02/05/2024   Currently on OTC Vit D 2,000 units daily with good compliance and tolerance. No acute concerns.   - Continue regimen and weight loss efforts.  - Recheck levels today.    Follow up:   Return 09/04/2024 9:20 AM.  She was informed of the importance of frequent follow up visits to maximize her success with intensive lifestyle modifications for her multiple health conditions.  Maria [name] is aware that we will review all of her lab results at our next visit together in person.  She is aware that if anything is critical/ life threatening with the results, we will be contacting her via MyChart or by my CMA will be calling them prior to the office visit to discuss acute management.     Subjective:   Chief complaint: Obesity Maria [name] is here to discuss her progress with her obesity treatment plan. She is keeping a food journal and adhering to recommended goals of 1000 calories and 80++ protein daily and states she is following her eating plan approximately 70% of the time. She states she is exercising 30 minutes 5 days per week.   Interval History:  Maria [name] is here for a follow up office visit. Since last OV on 07/01/2024 , she is down 4 lbs. She states a young 61 y.o family member of hers passed away from liver cirrhosis. She was close to him and this loss has been difficult for her. Food wise, she does endorse emotional eating at times. However, overall she is increasing her protein intake, not skipping meals, and is getting adequate  amounts of water. She is sleeping better.    Pharmacotherapy that aid with weight loss: She is currently taking Mounjaro  7.5 mg weekly.   Review of Systems:  Pertinent positives were addressed with patient today.  Reviewed by clinician on day of visit: allergies, medications, problem list, medical history, surgical history, family history, social history, and previous encounter notes.  Weight Summary and Biometrics   Weight Lost Since Last Visit: 4lb  Weight Gained Since Last Visit: 0   Vitals Temp: 98.1 F (36.7 C) BP: 109/72 Pulse Rate: 67 SpO2: 96 %   Anthropometric Measurements Height: 5' 4 (1.626 m) Weight: 170 lb (77.1 kg) BMI (Calculated): 29.17 Weight at Last Visit: 174lb Weight Lost Since Last Visit: 4lb Weight Gained Since Last Visit: 0 Starting Weight: 192lb Total Weight Loss (lbs): 22 lb (9.979 kg) Peak Weight: 192lb   Body Composition  Body Fat %: 40.6 % Fat Mass (lbs): 69.2 lbs Muscle Mass (lbs): 95.8 lbs Total Body Water (lbs): 65.6 lbs Visceral Fat Rating : 10  Other Clinical Data Fasting: no Labs: Yes Today's Visit #: 8 Starting Date: 02/05/24    Objective:   PHYSICAL EXAM: Blood pressure 109/72, pulse 67, temperature 98.1 F (36.7 C), height 5' 4 (1.626 m), weight 170 lb (77.1 kg), last menstrual period 05/21/2013, SpO2 96%. Body mass index is 29.18 kg/m.  General: she is overweight, cooperative and in no acute distress. PSYCH: Has normal mood, affect and thought process.   HEENT: EOMI, sclerae are anicteric. Lungs: Normal breathing effort, no conversational dyspnea. Extremities: Moves * 4 Neurologic: A and O * 3, good insight  DIAGNOSTIC DATA REVIEWED: BMET    Component Value Date/Time   NA 143 02/05/2024 1002   K 4.9 02/05/2024 1002   CL 106 02/05/2024 1002   CO2 22 02/05/2024 1002   GLUCOSE 126 (H) 02/05/2024 1002   GLUCOSE 224 (H) 08/29/2023 1419   BUN 18 02/05/2024 1002   CREATININE 1.10 (H) 02/05/2024 1002    CALCIUM  9.5 02/05/2024 1002   GFRNONAA 35 (L) 08/29/2023 1419   GFRAA 59 (L) 11/07/2018 1000   Lab Results  Component Value Date   HGBA1C 8.2 (H) 02/05/2024   HGBA1C 13.4 (H) 03/04/2016   No results found for: INSULIN  Lab Results  Component Value Date   TSH 1.210 02/05/2024   CBC    Component Value Date/Time   WBC 10.6 02/05/2024 1002   WBC 14.6 (H) 08/29/2023 1419   RBC 4.29 02/05/2024 1002   RBC 4.54 08/29/2023 1419   HGB 12.7 02/05/2024 1002   HCT 39.5 02/05/2024 1002   PLT 336 02/05/2024 1002   MCV 92 02/05/2024 1002   MCH 29.6 02/05/2024 1002   MCH 28.9 08/29/2023 1419   MCHC 32.2 02/05/2024 1002   MCHC 32.4 08/29/2023 1419   RDW 13.1 02/05/2024 1002   Iron Studies No results found for: IRON, TIBC, FERRITIN, IRONPCTSAT Lipid Panel     Component Value Date/Time   CHOL 193 02/05/2024 1002   TRIG 76 02/05/2024 1002   HDL 64 02/05/2024 1002   LDLCALC 115 (H) 02/05/2024 1002   Hepatic Function Panel     Component Value Date/Time   PROT 7.2 02/05/2024 1002   ALBUMIN 4.4 02/05/2024 1002   AST 20 02/05/2024 1002   ALT 18 02/05/2024 1002   ALKPHOS 152 (H) 02/05/2024 1002   BILITOT <0.2 02/05/2024 1002      Component Value Date/Time   TSH 1.210 02/05/2024 1002   Nutritional Lab Results  Component Value Date   VD25OH 41.8 02/05/2024    Attestations:   I, Special Puri, acting as a Stage manager for [employer], DO., have compiled all relevant documentation for today's office visit on behalf of Maria Jenkins, DO, while in the presence of [employer], DO.  I have spent 40 minutes in the care of the patient today including 26 minutes face-to-face assessing and reviewing listed medical problems above as outlined in office visit note and providing nutritional and behavioral counseling as outlined in obesity care plan.   I have reviewed the above documentation for accuracy and completeness, and I agree with the above. Maria [name],  D.O.  The 21st Century Cures Act was signed into law in 2016 which includes the topic of electronic health records.  This provides immediate access to information in MyChart.  This includes consultation notes, operative notes, office notes, lab results and pathology reports.  If you have any questions about what you read please let us  know at your next visit so we  can discuss your concerns and take corrective action if need be.  We are right here with you.

## 2024-08-07 LAB — VITAMIN D 25 HYDROXY (VIT D DEFICIENCY, FRACTURES): Vit D, 25-Hydroxy: 56.7 ng/mL (ref 30.0–100.0)

## 2024-08-09 NOTE — Telephone Encounter (Signed)
 Error

## 2024-08-13 ENCOUNTER — Ambulatory Visit (INDEPENDENT_AMBULATORY_CARE_PROVIDER_SITE_OTHER): Admitting: Family Medicine

## 2024-08-29 DIAGNOSIS — H65192 Other acute nonsuppurative otitis media, left ear: Secondary | ICD-10-CM | POA: Diagnosis not present

## 2024-08-29 DIAGNOSIS — R21 Rash and other nonspecific skin eruption: Secondary | ICD-10-CM | POA: Diagnosis not present

## 2024-08-29 DIAGNOSIS — I119 Hypertensive heart disease without heart failure: Secondary | ICD-10-CM | POA: Diagnosis not present

## 2024-08-29 DIAGNOSIS — M05649 Rheumatoid arthritis of unspecified hand with involvement of other organs and systems: Secondary | ICD-10-CM | POA: Diagnosis not present

## 2024-08-29 DIAGNOSIS — N1832 Chronic kidney disease, stage 3b: Secondary | ICD-10-CM | POA: Diagnosis not present

## 2024-08-29 DIAGNOSIS — E1122 Type 2 diabetes mellitus with diabetic chronic kidney disease: Secondary | ICD-10-CM | POA: Diagnosis not present

## 2024-08-29 DIAGNOSIS — E782 Mixed hyperlipidemia: Secondary | ICD-10-CM | POA: Diagnosis not present

## 2024-08-29 LAB — COMPREHENSIVE METABOLIC PANEL WITH GFR
Albumin: 4.4 (ref 3.5–5.0)
Calcium: 9.7 (ref 8.7–10.7)
Globulin: 3.1
eGFR: 40

## 2024-08-29 LAB — HEPATIC FUNCTION PANEL
ALT: 14 U/L (ref 7–35)
AST: 15 (ref 13–35)
Alkaline Phosphatase: 116 (ref 25–125)
Bilirubin, Total: 0.2

## 2024-08-29 LAB — BASIC METABOLIC PANEL WITH GFR
BUN: 34 — AB (ref 4–21)
CO2: 18 (ref 13–22)
Chloride: 107 (ref 99–108)
Creatinine: 1.5 — AB (ref 0.5–1.1)
Glucose: 144
Potassium: 4.5 meq/L (ref 3.5–5.1)
Sodium: 141 (ref 137–147)

## 2024-08-29 LAB — CBC AND DIFFERENTIAL
HCT: 40 (ref 36–46)
Hemoglobin: 13.1 (ref 12.0–16.0)
Neutrophils Absolute: 5263
Platelets: 348 K/uL (ref 150–400)
WBC: 10.2

## 2024-08-29 LAB — CBC: RBC: 4.34 (ref 3.87–5.11)

## 2024-09-03 LAB — LAB REPORT - SCANNED: EGFR: 40

## 2024-09-04 ENCOUNTER — Ambulatory Visit (INDEPENDENT_AMBULATORY_CARE_PROVIDER_SITE_OTHER): Admitting: Family Medicine

## 2024-09-04 ENCOUNTER — Encounter (INDEPENDENT_AMBULATORY_CARE_PROVIDER_SITE_OTHER): Payer: Self-pay | Admitting: Family Medicine

## 2024-09-04 VITALS — BP 119/72 | HR 77 | Temp 98.5°F | Ht 64.0 in | Wt 166.0 lb

## 2024-09-04 DIAGNOSIS — N1832 Chronic kidney disease, stage 3b: Secondary | ICD-10-CM

## 2024-09-04 DIAGNOSIS — E1169 Type 2 diabetes mellitus with other specified complication: Secondary | ICD-10-CM | POA: Diagnosis not present

## 2024-09-04 DIAGNOSIS — Z6828 Body mass index (BMI) 28.0-28.9, adult: Secondary | ICD-10-CM

## 2024-09-04 DIAGNOSIS — E1122 Type 2 diabetes mellitus with diabetic chronic kidney disease: Secondary | ICD-10-CM | POA: Diagnosis not present

## 2024-09-04 DIAGNOSIS — Z6830 Body mass index (BMI) 30.0-30.9, adult: Secondary | ICD-10-CM

## 2024-09-04 DIAGNOSIS — Z7985 Long-term (current) use of injectable non-insulin antidiabetic drugs: Secondary | ICD-10-CM

## 2024-09-04 DIAGNOSIS — I152 Hypertension secondary to endocrine disorders: Secondary | ICD-10-CM

## 2024-09-04 DIAGNOSIS — F4320 Adjustment disorder, unspecified: Secondary | ICD-10-CM

## 2024-09-04 DIAGNOSIS — E669 Obesity, unspecified: Secondary | ICD-10-CM

## 2024-09-04 DIAGNOSIS — E1159 Type 2 diabetes mellitus with other circulatory complications: Secondary | ICD-10-CM

## 2024-09-04 DIAGNOSIS — E559 Vitamin D deficiency, unspecified: Secondary | ICD-10-CM

## 2024-09-04 MED ORDER — TIRZEPATIDE 7.5 MG/0.5ML ~~LOC~~ SOAJ: 7.5000 mg | SUBCUTANEOUS | 0 refills | Status: DC

## 2024-09-04 MED ORDER — VITAMIN D3 50 MCG (2000 UT) PO CAPS: 2000.0000 [IU] | ORAL_CAPSULE | Freq: Every day | ORAL | Status: AC

## 2024-09-04 NOTE — Progress Notes (Signed)
 Maria [name], D.O.  ABFM, ABOM Specializing in Clinical Bariatric Medicine  Office located at: [address] [place], [place]  [address]   Assessment and Plan:  No orders of the defined types were placed in this encounter.   Medications Discontinued During This Encounter  Medication Reason   glipiZIDE  (GLUCOTROL ) 5 MG tablet      No orders of the defined types were placed in this encounter.     FOR THE DISEASE OF OBESITY:  *** Assessment & Plan: Since last office visit on *** patient's muscle mass has {DID:29233} by ***lbs. Fat mass has {DID:29233} by ***lbs. Total body water has {DID:29233} by ***lbs.  Body fat % has {DID:29233} by *** %. Counseling done on how various foods will affect these numbers and how to maximize success  Total lbs lost to date: *** lbs Total weight loss percentage to date: *** %   Recommended Dietary Goals Maria [name] is currently in the action stage of change. As such, her goal is to continue weight management plan.  She has agreed to: {EMWTLOSSPLAN:29297::continue current plan}   Behavioral Intervention We discussed the following today: increasing lean protein intake to established goals and continue to work on maintaining a reduced calorie state, getting the recommended amount of protein, incorporating whole foods, making healthy choices, staying well hydrated and practicing mindfulness when eating.  Additional resources provided today: Handout on CAT 1 meal plan  and Handout on CAT 1-2 breakfast options Successful Weight Loss B+L options Evidence-based interventions for health behavior change were utilized today including the discussion of self monitoring techniques, problem-solving barriers and SMART goal setting techniques.   Regarding patient's less desirable eating habits and patterns, we employed the technique of small changes.   Goal(s) for next OV: ***    Recommended Physical Activity Goals [name] has been advised to  work up to 300-450 minutes of moderate intensity aerobic activity a week and strengthening exercises 2-3 times per week for cardiovascular health, weight loss maintenance and preservation of muscle mass.   She has agreed to: {EMEXERCISE:28847::Think about enjoyable ways to increase daily physical activity and overcoming barriers to exercise,Increase physical activity in their day and reduce sedentary time (increase NEAT).,Increase volume of physical activity to a goal of 240 minutes a week,Combine aerobic and strengthening exercises for efficiency and improved cardiometabolic health.}   Pharmacotherapy We both agreed to: {EMagreedrx:29170}   ASSOCIATED CONDITIONS ADDRESSED TODAY:  Type 2 diabetes mellitus with obesity (HCC) Brought in labs from her PCP, which we reviewed togethre Repeat HgbA1c 6.9 improved from 8.2 Peak BG of 400 and A1c 13.4 in 2017  Glipizide , stopped this week Mounjaro  Sugars running normal in the morning 115, 90, 93, 88, 103, 95 131-155 2 hours after dinner  No hunger/cravings  Diabetes Mellitus: {EWCOURSE:24262} Medication: ***. Issues reviewed: blood sugar goals, complications of diabetes mellitus, hypoglycemia prevention and treatment, exercise, and nutrition. Plan: {HWWDMPLAN:25169}  Lab Results  Component Value Date   HGBA1C 8.2 (H) 02/05/2024   HGBA1C 13.4 (H) 03/04/2016     Assessment: Condition is {EWCOURSE:24262}. {LabsReviewed (Optional):29178}. {lab discussion:29204}.  Lab Results  Component Value Date   HGBA1C 8.2 (H) 02/05/2024   HGBA1C 13.4 (H) 03/04/2016     Plan:***  - Counseled patient on pathophysiology of disease and discussed how good blood sugar control is important to decrease the risk of diabetic complications such as nephropathy, neuropathy, limb loss, blindness, coronary artery disease, etc.   - Intensive lifestyle modification including diet, exercise and weight loss  are the first line of treatment for diabetes. We  extensively discussed the importance of decreasing simple carbs, increasing proteins and how certain foods they eat will affect their blood sugars - Reminded Maria [name] if she feels poorly- check Blood Sugar and Blood Pressure at that time.    - Hypoglycemia prevention discussed with the patient.  Eat on a regular basis- no skipping or going long periods without eating.     - Recommend that any concerns about medicines should be directed at the prescribing provider - Recheck labs in 3 months if not done at Endo provider / PCP.  - Importance of f/up with PCP and all other specialists, as scheduled, was stressed to the patient today   Hypertension associated with diabetes (HCC)CMP - HTN - BP good at home 120s systolic Serum Creatinine 1.65  eGFR 35 Drinking water BP at goal, however kidney function Discussed how uncontrolled blood pressure and blood sugars contribute to kidney damage Smoker has been working on quitting 1 cigarette daily, at most 5 cigs daily that she can remember Sugarfree lollipop {EWCOURSE:24262} Medications: ***.   Plan: Avoid buying foods that are: processed, frozen, or prepackaged to avoid excess salt. {HWWPLANHTN:25170}  BP Readings from Last 3 Encounters:  09/04/24 119/72  08/06/24 109/72  07/01/24 117/76   Lab Results  Component Value Date   CREATININE 1.10 (H) 02/05/2024   EGFR 58 (L) 02/05/2024   Chronic kidney disease (CKD) stage G3b/A2, moderately decreased glomerular filtration rate (GFR) between 30-44 mL/min/1.73 square meter and albuminuria creatinine ratio between 30-299 mg/g (HCC)  Avoid NSAIDS I recommended the patient to drink approximately half of their body weight in ounces of water daily (85 oz for her). Additionally, have an extra bottle of water for every 30 minutes of exercise. Increase exercise to 45 minutes daily  Recommended talking to PCP about Nephrology referral   Grief reaction At LOV, we discussed a recent loss she  experienced of a young 61 y.o family member of hers that passed away from liver cirrhosis. This loss was difficult for her and she did endorse occasional emotional eating attribute to her grief.     Vitamin-D Deficiency Managed on Vitamin-D 2000 units daily. Reviewed Vitamin-D obtained at LOV, which has improved by 14 points from February.  Lab Results  Component Value Date   VD25OH 56.7 08/06/2024   VD25OH 41.8 02/05/2024     Follow up:   No follow-ups on file. *** She was informed of the importance of frequent follow up visits to maximize her success with intensive lifestyle modifications for her multiple health conditions.    Subjective:    Chief complaint: Obesity Anniemae is here to discuss her progress with her obesity treatment plan. She is on the Category 1 Plan, tracking 1000 calories + 80 g of protein and states she is following her eating plan approximately 100% of the time. Pt is walking 30 minutes 5 days per week  Tracking 1000 cals + 80g   Interval History:  Cabela Pacifico is here for a follow up office visit. Pt has experienced a weight *** of *** since last OV on ***.          Pharmacotherapy that aid with weight loss: She is currently taking {EMPharmaco:28845}.    Review of Systems:  Pertinent positives were addressed with patient today.  Reviewed by clinician on day of visit: allergies, medications, problem list, medical history, surgical history, family history, social history, and previous encounter notes.  Weight Summary  and Biometrics   Weight Lost Since Last Visit: 4lb  Weight Gained Since Last Visit: 0   Vitals Temp: 98.5 F (36.9 C) BP: 119/72 Pulse Rate: 77 SpO2: 97 %   Anthropometric Measurements Height: 5' 4 (1.626 m) Weight: 166 lb (75.3 kg) BMI (Calculated): 28.48 Weight at Last Visit: 170lb Weight Lost Since Last Visit: 4lb Weight Gained Since Last Visit: 0 Starting Weight: 192lb Total Weight Loss (lbs): 26 lb  (11.8 kg) Peak Weight: 192lb   Body Composition  Body Fat %: 40.5 % Fat Mass (lbs): 67.4 lbs Muscle Mass (lbs): 93.8 lbs Total Body Water (lbs): 62.6 lbs Visceral Fat Rating : 10   Other Clinical Data Fasting: yes Labs: no Today's Visit #: 9 Starting Date: 02/05/24    Objective:   PHYSICAL EXAM: Blood pressure 119/72, pulse 77, temperature 98.5 F (36.9 C), height 5' 4 (1.626 m), weight 166 lb (75.3 kg), last menstrual period 05/21/2013, SpO2 97%. Body mass index is 28.49 kg/m.  General: she is overweight, cooperative and in no acute distress. PSYCH: Has normal mood, affect and thought process.   HEENT: EOMI, sclerae are anicteric. Lungs: Normal breathing effort, no conversational dyspnea. Extremities: Moves * 4 Neurologic: A and O * 3, good insight  DIAGNOSTIC DATA REVIEWED: BMET    Component Value Date/Time   NA 143 02/05/2024 1002   K 4.9 02/05/2024 1002   CL 106 02/05/2024 1002   CO2 22 02/05/2024 1002   GLUCOSE 126 (H) 02/05/2024 1002   GLUCOSE 224 (H) 08/29/2023 1419   BUN 18 02/05/2024 1002   CREATININE 1.10 (H) 02/05/2024 1002   CALCIUM  9.5 02/05/2024 1002   GFRNONAA 35 (L) 08/29/2023 1419   GFRAA 59 (L) 11/07/2018 1000   Lab Results  Component Value Date   HGBA1C 8.2 (H) 02/05/2024   HGBA1C 13.4 (H) 03/04/2016   No results found for: INSULIN  Lab Results  Component Value Date   TSH 1.210 02/05/2024   CBC    Component Value Date/Time   WBC 10.6 02/05/2024 1002   WBC 14.6 (H) 08/29/2023 1419   RBC 4.29 02/05/2024 1002   RBC 4.54 08/29/2023 1419   HGB 12.7 02/05/2024 1002   HCT 39.5 02/05/2024 1002   PLT 336 02/05/2024 1002   MCV 92 02/05/2024 1002   MCH 29.6 02/05/2024 1002   MCH 28.9 08/29/2023 1419   MCHC 32.2 02/05/2024 1002   MCHC 32.4 08/29/2023 1419   RDW 13.1 02/05/2024 1002   Iron Studies No results found for: IRON, TIBC, FERRITIN, IRONPCTSAT  Lipid Panel     Component Value Date/Time   CHOL 193 02/05/2024  1002   TRIG 76 02/05/2024 1002   HDL 64 02/05/2024 1002   LDLCALC 115 (H) 02/05/2024 1002   Hepatic Function Panel     Component Value Date/Time   PROT 7.2 02/05/2024 1002   ALBUMIN 4.4 02/05/2024 1002   AST 20 02/05/2024 1002   ALT 18 02/05/2024 1002   ALKPHOS 152 (H) 02/05/2024 1002   BILITOT <0.2 02/05/2024 1002      Component Value Date/Time   TSH 1.210 02/05/2024 1002   Nutritional Lab Results  Component Value Date   VD25OH 56.7 08/06/2024   VD25OH 41.8 02/05/2024    Attestations:   IDamien Blanks, acting as a Stage manager for Maria Jenkins, DO., have compiled all relevant documentation for today's office visit on behalf of Maria Jenkins, DO, while in the presence of [employer], DO.  I have spent 42 minutes in  the care of the patient today including 32 minutes face-to-face assessing and reviewing listed medical problems above as outlined in office visit note and providing nutritional and behavioral counseling as outlined in obesity care plan.   I have reviewed the above documentation for accuracy and completeness, and I agree with the above. Maria JINNY [name], D.O.  The 21st Century Cures Act was signed into law in 2016 which includes the topic of electronic health records.  This provides immediate access to information in MyChart.  This includes consultation notes, operative notes, office notes, lab results and pathology reports.  If you have any questions about what you read please let us  know at your next visit so we can discuss your concerns and take corrective action if need be.  We are right here with you.

## 2024-09-11 ENCOUNTER — Encounter (INDEPENDENT_AMBULATORY_CARE_PROVIDER_SITE_OTHER): Payer: Self-pay

## 2024-09-19 DIAGNOSIS — M1990 Unspecified osteoarthritis, unspecified site: Secondary | ICD-10-CM | POA: Diagnosis not present

## 2024-09-19 DIAGNOSIS — Z23 Encounter for immunization: Secondary | ICD-10-CM | POA: Diagnosis not present

## 2024-09-19 DIAGNOSIS — M797 Fibromyalgia: Secondary | ICD-10-CM | POA: Diagnosis not present

## 2024-09-19 DIAGNOSIS — L4050 Arthropathic psoriasis, unspecified: Secondary | ICD-10-CM | POA: Diagnosis not present

## 2024-09-19 DIAGNOSIS — Z79899 Other long term (current) drug therapy: Secondary | ICD-10-CM | POA: Diagnosis not present

## 2024-10-02 ENCOUNTER — Ambulatory Visit (INDEPENDENT_AMBULATORY_CARE_PROVIDER_SITE_OTHER): Admitting: Family Medicine

## 2024-10-02 ENCOUNTER — Encounter: Payer: Self-pay | Admitting: *Deleted

## 2024-10-02 ENCOUNTER — Encounter (INDEPENDENT_AMBULATORY_CARE_PROVIDER_SITE_OTHER): Payer: Self-pay | Admitting: Family Medicine

## 2024-10-02 VITALS — BP 97/63 | HR 62 | Temp 97.6°F | Ht 64.0 in | Wt 165.0 lb

## 2024-10-02 DIAGNOSIS — E669 Obesity, unspecified: Secondary | ICD-10-CM

## 2024-10-02 DIAGNOSIS — Z7985 Long-term (current) use of injectable non-insulin antidiabetic drugs: Secondary | ICD-10-CM

## 2024-10-02 DIAGNOSIS — I152 Hypertension secondary to endocrine disorders: Secondary | ICD-10-CM | POA: Diagnosis not present

## 2024-10-02 DIAGNOSIS — E1159 Type 2 diabetes mellitus with other circulatory complications: Secondary | ICD-10-CM | POA: Diagnosis not present

## 2024-10-02 DIAGNOSIS — E119 Type 2 diabetes mellitus without complications: Secondary | ICD-10-CM

## 2024-10-02 DIAGNOSIS — Z6830 Body mass index (BMI) 30.0-30.9, adult: Secondary | ICD-10-CM

## 2024-10-02 DIAGNOSIS — Z6828 Body mass index (BMI) 28.0-28.9, adult: Secondary | ICD-10-CM

## 2024-10-02 DIAGNOSIS — E559 Vitamin D deficiency, unspecified: Secondary | ICD-10-CM | POA: Insufficient documentation

## 2024-10-02 MED ORDER — TIRZEPATIDE 7.5 MG/0.5ML ~~LOC~~ SOAJ: 7.5000 mg | SUBCUTANEOUS | 0 refills | Status: DC

## 2024-10-02 NOTE — Progress Notes (Signed)
 Maria [name]                                          MRN: [id number]   10/02/2024   The [facility] Quality Team Specialist reviewed this patient medical record for the purposes of chart review for care gap closure. The following were reviewed: chart review for care gap closure-kidney health evaluation for diabetes:eGFR  and uACR.  No uACR found to close gap  [facility] Quality Team

## 2024-10-02 NOTE — Progress Notes (Signed)
 Maria [name], D.O.  ABFM, ABOM Specializing in Clinical Bariatric Medicine  Office located at: [address] [place], [place]  [address]    FOR THE CHRONIC DISEASE OF OBESITY:   Obesity (BMI 30-39.9), Starting BMI 32.96; BMI 30.0-30.9,adult -- Current BMI 28.31  Weight Summary and Body Composition Analysis   Weight Lost Since Last Visit: 1lb  Weight Gained Since Last Visit: 0lb    Vitals Temp: 97.6 F (36.4 C) BP: 97/63 Pulse Rate: 62 SpO2: 98 %   Anthropometric Measurements Height: 5' 4 (1.626 m) Weight: 165 lb (74.8 kg) BMI (Calculated): 28.31 Weight at Last Visit: 166lb Weight Lost Since Last Visit: 1lb Weight Gained Since Last Visit: 0lb Starting Weight: 192lb Total Weight Loss (lbs): 27 lb (12.2 kg) Peak Weight: 192lb   Body Composition  Body Fat %: 41.3 % Fat Mass (lbs): 68.2 lbs Muscle Mass (lbs): 91.8 lbs Total Body Water (lbs): 62.2 lbs Visceral Fat Rating : 10   Other Clinical Data Fasting: yes Labs: no Today's Visit #: 10 Starting Date: 02/05/24    Chief complaint: Obesity  Interval History [name] is here for a follow-up office visit to discuss her progress with her obesity treatment plan. She is on the Category 1 Plan and keeping a food journal and adhering to recommended goals of 1000  calories and 80+ grams  protein and states she is following her eating plan approximately 99.9 % of the time. She is walking 30  minutes 5 days per week  She has experienced a weight loss of 1 lb since last OV on 09/04/2024.   Her dietary and life habits include:  - Tracking Calories/Macros: no  - Has been eating out a bit more but is intentional about selecting healthier choices.  - Adequate Protein Intake: yes  - Adequate Water Intake: yes  - Skipping Meals: yes, typically skips lunch on the weekends due to being busy.  - Sleeping 7-9 Hours/ Night: yes    09/04/24 09:00 10/02/24 08:00   Body Fat % 40.5 % 41.3 %   Muscle Mass (lbs) 93.8 lbs 91.8 lbs  Fat Mass (lbs) 67.4 lbs 68.2 lbs  Total Body Water (lbs) 62.6 lbs 62.2 lbs  Visceral Fat Rating  10 10   Counseling done on how various foods will affect these numbers and how to maximize success  Total lbs lost to date: -27 lbs Total Fat Mass lost to date: -19 lbs Total weight loss percentage to date: -14.06 %   Nutritional and Behavioral Counseling:  We discussed the following today: high protein drinkable greek yogurts, increasing lean protein intake to established goals, avoiding skipping meals, and continue to work on implementation of reduced calorie nutritional plan  Additional resources provided today: Handout on benefits of exercise, Handout on [employer] exercise classes. Handout on resistance training exercises using bands or free weights  Evidence-based interventions for health behavior change were utilized today including the discussion of self monitoring techniques, problem-solving barriers and SMART goal setting techniques.   Regarding patient's less desirable eating habits and patterns, we employed the technique of small changes.   SMART Goal(s) created today: n/a   Recommended Dietary Goals [name] is currently in the action stage of change. As such, her goal is to continue weight management plan.  She has agreed to continue the Category 1 Plan and journaling 1000 cal and 80+ grams protein.   Recommended Physical Activity Goals [name] has been advised to work up to 300-450 minutes  of moderate intensity aerobic activity a week and strengthening exercises 2-3 times per week for cardiovascular health, weight loss maintenance and preservation of muscle mass.   She has agreed to continue walking 30 minutes 5 days/week and ADD resistance exercises 2 sets of 12 reps 3 days/week.    Medical Interventions and Pharmacotherapy Previous Bariatric surgery: n/a Pharmacotherapy for weight loss: continue Mounjaro  at current dose.      OBESITY RELATED CONDITIONS ADDRESSED TODAY:   Medications Discontinued During This Encounter  Medication Reason   tirzepatide  (MOUNJARO ) 7.5 MG/0.5ML Pen Reorder     Meds ordered this encounter  Medications   tirzepatide  (MOUNJARO ) 7.5 MG/0.5ML Pen    Sig: Inject 7.5 mg into the skin once a week.    Dispense:  2 mL    Refill:  0    Please fill once her 5mg  script is complete.     Pt encouraged to request PCP to send renal labs obtained 1-1.5 months ago to our clinic.   Type 2 diabetes mellitus in patient with obesity [facility]) Assessment & Plan: Lab Results  Component Value Date   HGBA1C 6.9 07/25/2024   HGBA1C 8.2 (H) 02/05/2024   HGBA1C 13.4 (H) 03/04/2016    HgbA1c is not at goal for age and comorbid conditions. Goal A1c < 6. States her fasting sugars are stable at home, denies low readings. Her Glipizide  was discontinued around the 2nd week of September. On Mounjaro  7.5 mg wkly with good adherence. Good control of hunger and cravings. She mentions experiencing symptoms of acid reflux secondary to the Mounjaro . We agreed that she would work on not eating within 2-3 hours of lying down and avoiding trigger foods. She wishes to cont Mounjaro  at current dose (refill today). Cont balanced diet focusing on protein, fruits, and vegetables while limiting simple carbohydrates.    Hypertension associated with diabetes [facility]) Assessment & Plan: Last 3 blood pressure readings in our office are as follows: BP Readings from Last 3 Encounters:  10/02/24 97/63  09/04/24 119/72  08/06/24 109/72   The 10-year ASCVD risk score (Arnett DK, et al., 2019) is: 13.6%  Lab Results  Component Value Date   CREATININE 1.7 (A) 07/25/2024   On Tenormin 25 mg daily, Dyazide 37.5-25 mg daily and Cardizem  LA 360 mg 1 tablet daily with reported good compliance and tolerance. BP low today. Pt denies lightheadedness or dizziness. Recommend she check her BP at home at least 2-3 times weekly; pt  understands if BP is consistently 100/60 or less to contact PCP about adjusting BP medications. Recommend decreasing Tenormin at that time if PCP agrees as it can contribute to weight gain. Cont prudent nutritional plan.     Follow up:   Return 10/30/2024 at 9:20 AM.  She was informed of the importance of frequent follow up visits to maximize her success with intensive lifestyle modifications for her multiple health conditions.   Objective:   PHYSICAL EXAM: Blood pressure 97/63, pulse 62, temperature 97.6 F (36.4 C), height 5' 4 (1.626 m), weight 165 lb (74.8 kg), last menstrual period 05/21/2013, SpO2 98%. Body mass index is 28.32 kg/m.  General: she is overweight, cooperative and in no acute distress. PSYCH: Has normal mood, affect and thought process.   HEENT: EOMI, sclerae are anicteric. Lungs: Normal breathing effort, no conversational dyspnea. Extremities: Moves * 4 Neurologic: A and O * 3, good insight  DIAGNOSTIC DATA REVIEWED: BMET    Component Value Date/Time   NA 141 07/25/2024 1755   K  4.8 07/25/2024 1755   CL 112 (A) 07/25/2024 1755   CO2 22 07/25/2024 1755   GLUCOSE 126 (H) 02/05/2024 1002   GLUCOSE 224 (H) 08/29/2023 1419   BUN 34 (A) 07/25/2024 1755   CREATININE 1.7 (A) 07/25/2024 1755   CREATININE 1.10 (H) 02/05/2024 1002   CALCIUM  9.4 07/25/2024 1755   GFRNONAA 35 (L) 08/29/2023 1419   GFRAA 59 (L) 11/07/2018 1000   Lab Results  Component Value Date   HGBA1C 6.9 07/25/2024   HGBA1C 13.4 (H) 03/04/2016   No results found for: INSULIN  Lab Results  Component Value Date   TSH 1.210 02/05/2024   CBC    Component Value Date/Time   WBC 10.6 02/05/2024 1002   WBC 14.6 (H) 08/29/2023 1419   RBC 4.19 07/25/2024 1755   HGB 12.5 07/25/2024 1755   HGB 12.7 02/05/2024 1002   HCT 39 07/25/2024 1755   HCT 39.5 02/05/2024 1002   PLT 308 07/25/2024 1755   PLT 336 02/05/2024 1002   MCV 92 02/05/2024 1002   MCH 29.6 02/05/2024 1002   MCH 28.9  08/29/2023 1419   MCHC 32.2 02/05/2024 1002   MCHC 32.4 08/29/2023 1419   RDW 13.1 02/05/2024 1002   Iron Studies No results found for: IRON, TIBC, FERRITIN, IRONPCTSAT Lipid Panel     Component Value Date/Time   CHOL 193 02/05/2024 1002   TRIG 76 02/05/2024 1002   HDL 64 02/05/2024 1002   LDLCALC 115 (H) 02/05/2024 1002   Hepatic Function Panel     Component Value Date/Time   PROT 7.2 02/05/2024 1002   ALBUMIN 4.4 07/25/2024 1755   ALBUMIN 4.4 02/05/2024 1002   AST 19 07/25/2024 1755   ALT 20 07/25/2024 1755   ALKPHOS 101 07/25/2024 1755   BILITOT <0.2 02/05/2024 1002      Component Value Date/Time   TSH 1.210 02/05/2024 1002   Nutritional Lab Results  Component Value Date   VD25OH 56.7 08/06/2024   VD25OH 41.8 02/05/2024    Attestations:   I, Special Puri, acting as a Stage manager for [employer], DO., have compiled all relevant documentation for today's office visit on behalf of Maria Jenkins, DO, while in the presence of [employer], DO.  Pertinent positives were addressed with patient today. Reviewed by clinician on day of visit: allergies, medications, problem list, medical history, surgical history, family history, social history, and previous encounter notes.  I have reviewed the above documentation for accuracy and completeness, and I agree with the above. Maria JINNY [name], D.O.  The 21st Century Cures Act was signed into law in 2016 which includes the topic of electronic health records.  This provides immediate access to information in MyChart. This includes consultation notes, operative notes, office notes, lab results and pathology reports.  If you have any questions about what you read please let us  know at your next visit so we can discuss your concerns and take corrective action if need be.  We are right here with you.

## 2024-10-24 ENCOUNTER — Ambulatory Visit: Admitting: Student in an Organized Health Care Education/Training Program

## 2024-10-28 ENCOUNTER — Encounter: Payer: Self-pay | Admitting: Radiology

## 2024-10-29 ENCOUNTER — Encounter (HOSPITAL_COMMUNITY): Payer: Self-pay

## 2024-10-29 ENCOUNTER — Ambulatory Visit (HOSPITAL_COMMUNITY)
Admission: EM | Admit: 2024-10-29 | Discharge: 2024-10-29 | Disposition: A | Discharge status: Home / Self Care | Payer: Worker's Compensation | Attending: Student | Admitting: Student

## 2024-10-29 ENCOUNTER — Ambulatory Visit (HOSPITAL_COMMUNITY): Payer: Self-pay | Attending: Student

## 2024-10-29 DIAGNOSIS — S39012A Strain of muscle, fascia and tendon of lower back, initial encounter: Secondary | ICD-10-CM | POA: Diagnosis not present

## 2024-10-29 DIAGNOSIS — S62654A Nondisplaced fracture of medial phalanx of right ring finger, initial encounter for closed fracture: Secondary | ICD-10-CM | POA: Diagnosis not present

## 2024-10-29 DIAGNOSIS — W19XXXA Unspecified fall, initial encounter: Secondary | ICD-10-CM | POA: Diagnosis not present

## 2024-10-29 MED ORDER — ACETAMINOPHEN 325 MG PO TABS
650.0000 mg | ORAL_TABLET | Freq: Once | ORAL | Status: AC
  Administered 2024-10-29: 650 mg via ORAL

## 2024-10-29 MED ORDER — ACETAMINOPHEN 325 MG PO TABS
ORAL_TABLET | ORAL | Status: AC
  Filled 2024-10-29: qty 2

## 2024-10-29 MED ORDER — TRAMADOL HCL 50 MG PO TABS: 50.0000 mg | ORAL_TABLET | Freq: Four times a day (QID) | ORAL | 0 refills | Status: AC | PRN

## 2024-10-29 NOTE — ED Triage Notes (Signed)
 Pt states tripped over a chair landing flat on her back at work today at 4:30pm. C/o rt arm/hand/lower back pain. C/o swelling to rt ring finger. States has rods in her back. Denies taken any meds.

## 2024-10-29 NOTE — Discharge Instructions (Addendum)
-  Your right ring fracture is broken:  1. Acute avulsion fracture of the volar base of the fourth middle phalanx.  -Use the finger split until you follow-up with the hand specialist. Please call them tomorrow for a follow-up appointment. You can remove the splint to gently wash your hands.  -Tylenol  for pain up to 1000mg  3x daily.  -Tramadol  for pain, up to every 6 hours. This medication can cause drowsiness, so don't take before driving. Don't drink alcohol while on this medication. -Okay to use ice for 15 minutes at a time. Don't apply ice directly to skin

## 2024-10-29 NOTE — ED Provider Notes (Signed)
 [facility]    CSN: [id number] Arrival date & time: 10/29/24  1704      History   Chief Complaint Chief Complaint  Patient presents with   Fall    HPI Maria [name] is a 63 y.o. female presenting for right hand injury (Worker's Comp.).  History of CKD stage III, diabetes, fibromyalgia, rheumatoid arthritis, chronic lower back pain; and spinal surgery 2022 titanium rods and screws in the lower back per pt, she is no longer a patient with [facility]. Pt states tripped over a chair landing flat on her back at work today at 4:30pm. C/o rt arm/hand/lower back pain. C/o swelling to rt ring finger. She is right handed. States has rods in her back. Denies taken any meds. She is unable to take NSAIDs. She has a PCP - appt with them 10/31/24. Denies head trauma or HA.  HPI  Past Medical History:  Diagnosis Date   Anxiety    Asthma    Back pain    CFS (chronic fatigue syndrome)    Chronic kidney disease (CKD), stage III (moderate) (HCC)    Chronic lower back pain    Constipation    Daily headache    Diabetes (HCC)    DKA (diabetic ketoacidoses) admission 03/03/2016   Fibroid    Fibromyalgia    GERD (gastroesophageal reflux disease)    HTN (hypertension)    IBS (irritable bowel syndrome)    Lactose intolerance    Pelvic pain in female    Pneumonia 05/26/2013   PONV (postoperative nausea and vomiting)    Renal insufficiency    Rheumatoid arthritis [facility])     Patient Active Problem List   Diagnosis Date Noted   Type 2 diabetes mellitus in patient with obesity (HCC) 10/02/2024   Vitamin D  deficiency 10/02/2024   Porokeratosis 01/02/2023   Mild persistent asthma without complication 02/09/2017   Hyperglycemia 03/03/2016   CKD (chronic kidney disease), stage III (HCC) 03/03/2016   CAP (community acquired pneumonia) 06/03/2013   GERD (gastroesophageal reflux disease) 06/03/2013   Hypertension associated with diabetes (HCC) 06/03/2013    Fibromyalgia syndrome 06/03/2013   Fibroids 08/07/2012   Renal insufficiency 08/07/2012   Herpes 08/07/2012    Past Surgical History:  Procedure Laterality Date   BREAST BIOPSY Right    BREAST EXCISIONAL BIOPSY Right    DILATION AND CURETTAGE OF UTERUS  2008   RADIOACTIVE SEED GUIDED EXCISIONAL BREAST BIOPSY Right 11/13/2018   Procedure: RIGHT BREAST RADIOACTIVE SEED GUIDED EXCISIONAL BIOPSY;  Surgeon: Ebbie Cough, MD;  Location: [facility];  Service: General;  Laterality: Right;    OB History     Gravida  1   Para  0   Term      Preterm      AB      Living  0      SAB      IAB      Ectopic      Multiple      Live Births               Home Medications    Prior to Admission medications   Medication Sig Start Date End Date Taking? Authorizing Provider  traMADol  (ULTRAM ) 50 MG tablet Take 1 tablet (50 mg total) by mouth every 6 (six) hours as needed for up to 3 days. 10/29/24 11/01/24 Yes Arlyss Leita BRAVO, PA-C  albuterol  (VENTOLIN  HFA) 108 (90 Base) MCG/ACT inhaler albuterol  sulfate HFA 90 mcg/actuation  aerosol inhaler  INHALE 1 TO 2 PUFFS INTO THE LUNGS EVERY 4 TO 6 HOURS FOR COUGH/WHEEZE    [provider]  allopurinol (ZYLOPRIM) 100 MG tablet Take 100 mg by mouth daily. 03/23/24   [provider]  ALPRAZolam  (XANAX ) 1 MG tablet Take 1 mg by mouth at bedtime as needed for sleep or anxiety.    [provider]  ASMANEX, 60 METERED DOSES, 220 MCG/ACT inhaler Inhale 1 puff into the lungs 2 (two) times daily. 03/23/24   [provider]  atenolol (TENORMIN) 25 MG tablet Take by mouth daily.    [provider]  azithromycin  (ZITHROMAX ) 250 MG tablet Take by mouth. 08/30/24   [provider]  Blood Glucose Monitoring Suppl (TRUE METRIX METER) DEVI 1 each by Does not apply route 4 (four) times daily. Generic OK if available. Other brands OK based on price. 03/06/16   Gherghe, Costin M, MD   carisoprodol  (SOMA ) 250 MG tablet Take 350 mg by mouth at bedtime.  Patient not taking: Reported on 10/02/2024    [provider]  carisoprodol  (SOMA ) 350 MG tablet Take 350 mg by mouth 3 (three) times daily as needed. 03/23/24   [provider]  cetirizine (ZYRTEC) 10 MG tablet Take 10 mg by mouth daily.    [provider]  Cholecalciferol  (VITAMIN D3) 50 MCG (2000 UT) capsule Take 1 capsule (2,000 Units total) by mouth daily. 09/04/24   Opalski, Barnie, DO  clotrimazole-betamethasone (LOTRISONE) cream Apply topically 2 (two) times daily. 08/31/24   [provider]  COSENTYX SENSOREADY, 300 MG, 150 MG/ML SOAJ Inject 300 mg into the skin every 28 (twenty-eight) days. 04/01/24   [provider]  diltiazem  (CARDIZEM  LA) 360 MG 24 hr tablet 1 tablet    [provider]  esomeprazole (NEXIUM) 20 MG capsule Take 20 mg by mouth daily at 12 noon.    [provider]  famotidine (PEPCID) 40 MG tablet 1 tablet at bedtime.    [provider]  Fluticasone  Furoate 100 MCG/ACT AEPB Inhale into the lungs.    [provider]  gabapentin  (NEURONTIN ) 300 MG capsule gabapentin  300 mg capsule  TAKE 1 CAPSULE BY MOUTH 4 TIMES A DAY    [provider]  glucose blood (TRUE METRIX BLOOD GLUCOSE TEST) test strip 4 times daily. Generic OK if available. Other brands OK based on price. 03/06/16   Gherghe, Costin M, MD  hydrOXYzine (ATARAX) 25 MG tablet Take 25 mg by mouth 3 (three) times daily as needed.    [provider]  ipratropium (ATROVENT) 0.03 % nasal spray Place 2 sprays into the nose 2 (two) times daily.    [provider]  magnesium oxide (MAG-OX) 400 (240 Mg) MG tablet Take 1 tablet by mouth 2 (two) times daily as needed. 04/06/24   [provider]  montelukast  (SINGULAIR ) 10 MG tablet Take 10 mg by mouth at bedtime.    [provider]  Multiple Vitamin (MULTIVITAMIN WITH MINERALS) TABS Take 1  tablet by mouth daily.    [provider]  rosuvastatin (CRESTOR) 5 MG tablet Take 5 mg by mouth daily.    [provider]  tirzepatide  (MOUNJARO ) 7.5 MG/0.5ML Pen Inject 7.5 mg into the skin once a week. 10/02/24   Opalski, Barnie, DO  triamterene-hydrochlorothiazide (DYAZIDE) 37.5-25 MG capsule Take 1 capsule by mouth daily.    [provider]  valACYclovir  (VALTREX ) 500 MG tablet Take 500 mg by mouth daily.  [provider]    Family History Family History  Problem Relation Age of Onset   Cancer Mother    Breast cancer Mother    Diabetes Mother    High blood pressure Mother    Diabetes Mellitus I Father    Alcoholism Father    Cancer Brother    Hypertension Brother    Breast cancer Maternal Grandmother     Social History Social History   Tobacco Use   Smoking status: Some Days    Current packs/day: 0.25    Average packs/day: 0.3 packs/day for 26.0 years (6.5 ttl pk-yrs)    Types: Cigarettes   Smokeless tobacco: Never  Vaping Use   Vaping status: Never Used  Substance Use Topics   Alcohol use: No   Drug use: No     Allergies   Leflunomide, Metformin, Advair diskus [fluticasone -salmeterol], Methocarbamol, and Other   Review of Systems Review of Systems  Constitutional:  Negative for chills and fever.  HENT:  Negative for ear pain and sore throat.   Eyes:  Negative for pain and visual disturbance.  Respiratory:  Negative for cough and shortness of breath.   Cardiovascular:  Negative for chest pain and palpitations.  Gastrointestinal:  Negative for abdominal pain and vomiting.  Genitourinary:  Negative for dysuria and hematuria.  Musculoskeletal:  Positive for back pain. Negative for arthralgias.       R hand pain  Skin:  Negative for color change and rash.  Neurological:  Negative for seizures and syncope.  All other systems reviewed and are negative.    Physical Exam Triage Vital Signs ED Triage Vitals  Encounter  Vitals Group     BP      Girls Systolic BP Percentile      Girls Diastolic BP Percentile      Boys Systolic BP Percentile      Boys Diastolic BP Percentile      Pulse      Resp      Temp      Temp src      SpO2      Weight      Height      Head Circumference      Peak Flow      Pain Score      Pain Loc      Pain Education      Exclude from Growth Chart    No data found.  Updated Vital Signs BP 126/76 (BP Location: Right Arm)   Pulse (!) 55   Temp 98.3 F (36.8 C) (Oral)   Resp 18   LMP 05/21/2013   SpO2 96%   Visual Acuity Right Eye Distance:   Left Eye Distance:   Bilateral Distance:    Right Eye Near:   Left Eye Near:    Bilateral Near:     Physical Exam Vitals reviewed.  Constitutional:      General: She is not in acute distress.    Appearance: Normal appearance. She is not ill-appearing.  HENT:     Head: Normocephalic and atraumatic.  Cardiovascular:     Rate and Rhythm: Normal rate and regular rhythm.     Heart sounds: Normal heart sounds.  Pulmonary:     Effort: Pulmonary effort is normal.     Breath sounds: Normal breath sounds and air entry.  Abdominal:     Tenderness: There is no abdominal tenderness. There is no right CVA tenderness, left CVA tenderness, guarding or rebound.  Musculoskeletal:  Cervical back: Normal range of motion. No swelling, deformity, signs of trauma, rigidity, spasms, tenderness, bony tenderness or crepitus. No pain with movement.     Thoracic back: No swelling, deformity, signs of trauma, spasms, tenderness or bony tenderness. Normal range of motion. No scoliosis.     Lumbar back: Spasms, tenderness and bony tenderness present. No swelling, deformity or signs of trauma. Normal range of motion. Negative right straight leg raise test and negative left straight leg raise test. No scoliosis.     Comments: There is midline lumbar spinous tenderness to palpation.  There is bilateral paraspinous tenderness to palpation.  Pain  elicited with going from sitting to standing, and flexion and extension of lumbar spine.  Strength and sensation intact upper and lower extremities.  Gait intact, but with pain. No thoracic or cervical midline spinous or bony tenderness, deformity, stepoff.  Right hand: No skin changes or effusion.  The radiocarpal joint is tender to palpation.  Tenderness elicited with movement.  No snuffbox tenderness.  Fingers 3 through 5 are tender to palpation, with no point tenderness.  Cap refill less than 2 seconds.  Grip strength 4/5, with pain.  No other injury, deformity, tenderness, ecchymosis, abrasion.  Skin:    Comments: Surgical scar overlying lumbar spine  Neurological:     General: No focal deficit present.     Mental Status: She is alert.     Cranial Nerves: No cranial nerve deficit.  Psychiatric:        Mood and Affect: Mood normal.        Behavior: Behavior normal.        Thought Content: Thought content normal.        Judgment: Judgment normal.      UC Treatments / Results  Labs (all labs ordered are listed, but only abnormal results are displayed) Labs Reviewed - No data to display  EKG   Radiology DG Hand Complete Right Result Date: 10/29/2024 EXAM: 3 OR MORE VIEW(S) XRAY OF THE HAND 10/29/2024 06:54:54 PM COMPARISON: None available. CLINICAL HISTORY: Back pain s/p fall - h/p spinal surgery 2022; wrist and finger 2-5 pain following fall FINDINGS: BONES AND JOINTS: Acute avulsion fracture of the volar base of the fourth middle phalanx. No joint dislocation. SOFT TISSUES: The soft tissues are unremarkable. IMPRESSION: 1. Acute avulsion fracture of the volar base of the fourth middle phalanx. Electronically signed by: Norman Gatlin MD 10/29/2024 07:07 PM EST RP Workstation: [id number]   DG Lumbar Spine Complete Result Date: 10/29/2024 EXAM: 4 VIEW(S) XRAY OF THE LUMBAR SPINE 10/29/2024 06:54:54 PM COMPARISON: 02/22/2019 CLINICAL HISTORY: Back pain s/p fall - h/p spinal surgery  2022; wrist and finger 2-5 pain following fall. FINDINGS: LUMBAR SPINE: BONES: Posterior fusion L5-S1. No evidence of loosening. No acute fracture. No aggressive appearing osseous lesion. Alignment is normal. DISCS AND DEGENERATIVE CHANGES: Mild facet arthropathy at L3-L4 and L4-L5. No severe degenerative changes. SOFT TISSUES: No acute abnormality. IMPRESSION: 1. No acute fracture. 2. Posterior fusion L5-S1. Electronically signed by: Norman Gatlin MD 10/29/2024 07:05 PM EST RP Workstation: [id number]    Procedures Procedures (including critical care time)  Medications Ordered in UC Medications  acetaminophen  (TYLENOL ) tablet 650 mg (650 mg Oral Given 10/29/24 1854)    Initial Impression / Assessment and Plan / UC Course  I have reviewed the triage vital signs and the nursing notes.  Pertinent labs & imaging results that were available during my care of the patient were reviewed by me and considered in my  medical decision making (see chart for details).     Patient is a 63 year old female presenting with right ring finger fracture.  She is neurovascularly intact.  Xray R hand: 1. Acute avulsion fracture of the volar base of the fourth middle phalanx. Xray lumbar spine: 1. No acute fracture. 2. Posterior fusion L5-S1.  The patient understands that an x-ray may not reveal the extent of damage to her spine, and that she may require an MRI.  She has a follow-up appointment with her PCP on 10/31/2024, and she can discuss this at that time.  She is no longer followed by spine.  History of CKD stage III, on 06/2024 labs, creatinine elevated at 1.7, GFR 35.  Creatinine clearance estimated to be 34 mL/min, using Cockcroft-Gault equation, modified for BMI.  Unable to take NSAIDs.  Placed in finger splint, and advised to follow-up with hand at their earliest convenience.  Tramadol  sent at patient preference.  She has a PCP follow-up on 10/31/2024.  Final Clinical Impressions(s) / UC Diagnoses    Final diagnoses:  Fall, initial encounter  Closed nondisplaced fracture of middle phalanx of right ring finger, initial encounter  Strain of lumbar region, initial encounter     Discharge Instructions      -Your right ring fracture is broken:  1. Acute avulsion fracture of the volar base of the fourth middle phalanx.  -Use the finger split until you follow-up with the hand specialist. Please call them tomorrow for a follow-up appointment. You can remove the splint to gently wash your hands.  -Tylenol  for pain up to 1000mg  3x daily.  -Tramadol  for pain, up to every 6 hours. This medication can cause drowsiness, so don't take before driving. Don't drink alcohol while on this medication. -Okay to use ice for 15 minutes at a time. Don't apply ice directly to skin     ED Prescriptions     Medication Sig Dispense Auth. Provider   traMADol  (ULTRAM ) 50 MG tablet Take 1 tablet (50 mg total) by mouth every 6 (six) hours as needed for up to 3 days. 12 tablet Garrie Woodin E, PA-C      I have reviewed the PDMP during this encounter.   Arlyss Leita BRAVO, PA-C 10/29/24 1931

## 2024-10-30 ENCOUNTER — Ambulatory Visit (INDEPENDENT_AMBULATORY_CARE_PROVIDER_SITE_OTHER): Payer: Self-pay | Admitting: Family Medicine

## 2024-10-30 ENCOUNTER — Other Ambulatory Visit (INDEPENDENT_AMBULATORY_CARE_PROVIDER_SITE_OTHER): Payer: Self-pay | Admitting: Family Medicine

## 2024-10-30 ENCOUNTER — Telehealth (INDEPENDENT_AMBULATORY_CARE_PROVIDER_SITE_OTHER): Payer: Self-pay | Admitting: Family Medicine

## 2024-10-30 DIAGNOSIS — E669 Obesity, unspecified: Secondary | ICD-10-CM

## 2024-10-30 MED ORDER — TIRZEPATIDE 7.5 MG/0.5ML ~~LOC~~ SOAJ: 7.5000 mg | SUBCUTANEOUS | 0 refills | Status: DC

## 2024-10-30 NOTE — Telephone Encounter (Signed)
 11/05 Pt cx appt due to falling and breaking her fingers. Pt states that she needs a refill of Mounjaro . She has one injection left that she will take on Saturday 11/02/2024.

## 2024-10-30 NOTE — Telephone Encounter (Signed)
 Phone call to patient to let her know that Dr. MALVA did refill her medication. Patient also is scheduled for a follow up in December.

## 2024-10-30 NOTE — Progress Notes (Signed)
 Refill given d/t pt fall and unable to make appt

## 2024-10-31 DIAGNOSIS — M05649 Rheumatoid arthritis of unspecified hand with involvement of other organs and systems: Secondary | ICD-10-CM | POA: Diagnosis not present

## 2024-10-31 DIAGNOSIS — E66811 Obesity, class 1: Secondary | ICD-10-CM | POA: Diagnosis not present

## 2024-10-31 DIAGNOSIS — I129 Hypertensive chronic kidney disease with stage 1 through stage 4 chronic kidney disease, or unspecified chronic kidney disease: Secondary | ICD-10-CM | POA: Diagnosis not present

## 2024-10-31 DIAGNOSIS — E1122 Type 2 diabetes mellitus with diabetic chronic kidney disease: Secondary | ICD-10-CM | POA: Diagnosis not present

## 2024-11-27 ENCOUNTER — Ambulatory Visit (INDEPENDENT_AMBULATORY_CARE_PROVIDER_SITE_OTHER): Payer: Self-pay | Admitting: Family Medicine

## 2024-11-27 ENCOUNTER — Encounter (INDEPENDENT_AMBULATORY_CARE_PROVIDER_SITE_OTHER): Payer: Self-pay | Admitting: Family Medicine

## 2024-11-27 VITALS — BP 132/84 | HR 72 | Temp 98.0°F | Ht 65.0 in | Wt 162.8 lb

## 2024-11-27 DIAGNOSIS — E119 Type 2 diabetes mellitus without complications: Secondary | ICD-10-CM | POA: Diagnosis not present

## 2024-11-27 DIAGNOSIS — E1159 Type 2 diabetes mellitus with other circulatory complications: Secondary | ICD-10-CM | POA: Diagnosis not present

## 2024-11-27 DIAGNOSIS — N1832 Chronic kidney disease, stage 3b: Secondary | ICD-10-CM

## 2024-11-27 DIAGNOSIS — E1169 Type 2 diabetes mellitus with other specified complication: Secondary | ICD-10-CM | POA: Insufficient documentation

## 2024-11-27 DIAGNOSIS — I152 Hypertension secondary to endocrine disorders: Secondary | ICD-10-CM | POA: Diagnosis not present

## 2024-11-27 DIAGNOSIS — E669 Obesity, unspecified: Secondary | ICD-10-CM

## 2024-11-27 DIAGNOSIS — Z6827 Body mass index (BMI) 27.0-27.9, adult: Secondary | ICD-10-CM

## 2024-11-27 DIAGNOSIS — E782 Mixed hyperlipidemia: Secondary | ICD-10-CM | POA: Diagnosis not present

## 2024-11-27 MED ORDER — TIRZEPATIDE 7.5 MG/0.5ML ~~LOC~~ SOAJ: 7.5000 mg | SUBCUTANEOUS | 0 refills | Status: DC

## 2024-11-27 NOTE — Progress Notes (Signed)
 Maria [name], D.O.  ABFM, ABOM Specializing in Clinical Bariatric Medicine  Office located at: [address] [place], [place]  [address]    FOR THE CHRONIC DISEASE OF OBESITY:   Obesity (BMI 30-39.9), Starting BMI 32.96 BMI 27.0-27.9,adult - current BMI 27.09  Weight Summary and Body Composition Analysis  Weight Lost Since Last Visit: 3 lbs  Weight Gained Since Last Visit: 0    Vitals Temp: 98 F (36.7 C) BP: 132/84 Pulse Rate: 72 SpO2: 97 %   Anthropometric Measurements Height: 5' 5 (1.651 m) Weight: 162 lb 12.8 oz (73.8 kg) BMI (Calculated): 27.09 Weight at Last Visit: 165 lbs Weight Lost Since Last Visit: 3 lbs Weight Gained Since Last Visit: 0 Starting Weight: 192 lbs Total Weight Loss (lbs): 30 lb (13.6 kg) Peak Weight: 192 lbs   Body Composition  Body Fat %: 40.3 % Fat Mass (lbs): 65.6 lbs Muscle Mass (lbs): 92.2 lbs Total Body Water (lbs): 63.2 lbs Visceral Fat Rating : 9   Other Clinical Data Fasting: yes Today's Visit #: 11 Starting Date: 02/05/24    Chief complaint: Obesity  Interval History Maria [name] is here for a follow-up office visit to discuss her progress with her obesity treatment plan. She is on the Category 1 Plan and keeping a food journal and adhering to recommended goals of 1000 calories and 80+ grams protein and states she is following her eating plan approximately 70 % of the time. She is increasing NEAT.  She has experienced a weight loss of 3 lbs since last OV on 10/02/2024.  Stressors: broken right finger  Her dietary and life habits include:  - Tracking Calories/Macros: not tracking at this time  - Eating More Whole Foods: no  - Adequate Protein Intake: no  - Adequate Water Intake: yes  - Skipping Meals: no  - Sleeping 7-9 Hours/ Night: yes    10/02/24 08:00 11/27/24 08:00   Body Fat % 41.3 % 40.3 %  Muscle Mass (lbs) 91.8 lbs 92.2 lbs  Fat Mass (lbs) 68.2 lbs 65.6 lbs  Total Body  Water (lbs) 62.2 lbs 63.2 lbs  Visceral Fat Rating  10 9   Counseling done on how various foods will affect these numbers and how to maximize success  Total Fat mass lost to date: - 21.6 lbs Total lbs lost to date: - 30 lbs Total weight loss percentage to date:  - 15.63 %   Nutritional and Behavioral Counseling:  We discussed the following today: {spwtlossstrategies:33761}  Additional resources provided today: Handout on NEAT and Handout on December Goals  Evidence-based interventions for health behavior change were utilized today including the discussion of self monitoring techniques, problem-solving barriers and SMART goal setting techniques.   Regarding patient's less desirable eating habits and patterns, we employed the technique of small changes.   SMART Goal(s) created today: maintain wt throughout holidays   Recommended Dietary Goals Maria [name] is currently in the action stage of change. As such, her goal is to continue weight management plan.  She has agreed to continue the Category 1 Plan and journaling 1000 cal and 80+ grams protein.   Recommended Physical Activity Goals Maria [name] has been advised to work up to 300-450 minutes of moderate intensity aerobic activity a week and strengthening exercises 2-3 times per week for cardiovascular health, weight loss maintenance and preservation of muscle mass.   She has agreed to INCREASE NEAT and think about enjoyable ways to increase formal daily physical activity.   Medical  Interventions and Pharmacotherapy Previous Bariatric surgery: n/a Pharmacotherapy: We both agreed to: {EMagreedrx:[id number]}    OBESITY RELATED CONDITIONS ADDRESSED TODAY:   No orders of the defined types were placed in this encounter.   There are no discontinued medications.   No orders of the defined types were placed in this encounter.     Type 2 diabetes mellitus in patient with obesity [facility]) Assessment & Plan: Lab Results  Component Value Date    HGBA1C 6.9 07/25/2024   HGBA1C 8.2 (H) 02/05/2024   HGBA1C 13.4 (H) 03/04/2016   HgbA1c is not at goal for age and comorbid conditions. Goal A1c < 6. States her fasting sugars are stable at home, denies low readings. Denies symptoms of hypoglycemia or hyperglycemia. On Mounjaro  7.5 mg weekly with good adherence and no side effects. Good control of hunger and cravings. Reviewed labs from 08/29/2024 that she brought in and had with her PCP. CBC,           Reminded pt about the importance of getting yearly DM eye & foot exams and urine microalb: crt ratio tests.  Continue Mounjaro  at same dose and  reduced calorie meal plan low on processed crabs and simple sugars. Ongoing weight loss will improve insulin  resistance and glycemic control    Chronic kidney disease (CKD) stage G3b/A2, moderately decreased glomerular filtration rate (GFR) between 30-44 mL/min/1.73 square meter and albuminuria creatinine ratio between 30-299 mg/g [facility] Assessment & Plan: Reviewed CMP dated 08/29/2024 that she had with her PCP. Serum Creatinine improved from 1.7 (07/25/2024) to 1.47. Continue working on adequate hydration, exercise, controlling blood sugars/blood pressure, and avoiding nephrotoxic substances like NSAIDs to improve kidney function.     Mixed diabetic hyperlipidemia associated with type 2 diabetes mellitus [facility]) Assessment & Plan: Lab Results  Component Value Date   CHOL 193 02/05/2024   HDL 64 02/05/2024   LDLCALC 115 (H) 02/05/2024   TRIG 76 02/05/2024     Check labs   LDL <70 goal    Hypertension associated with diabetes [facility]) Assessment & Plan: BP Readings from Last 3 Encounters:  11/27/24 132/84  10/02/24 97/63  09/04/24 119/72    PCP stopped Dyazide (b/c her BP was running low)  Then she weanoed her sellf off atenlol   Only  taking cardizem   Objective:   PHYSICAL EXAM: Blood pressure 132/84, pulse 72, temperature 98 F (36.7 C), height 5' 5 (1.651 m), weight 162  lb 12.8 oz (73.8 kg), last menstrual period 05/21/2013, SpO2 97%. Body mass index is 27.09 kg/m.  General: she is overweight, cooperative and in no acute distress. PSYCH: Has normal mood, affect and thought process.   HEENT: EOMI, sclerae are anicteric. Lungs: Normal breathing effort, no conversational dyspnea. Extremities: Moves * 4 Neurologic: A and O * 3, good insight  DIAGNOSTIC DATA REVIEWED: BMET    Component Value Date/Time   NA 141 07/25/2024 1755   K 4.8 07/25/2024 1755   CL 112 (A) 07/25/2024 1755   CO2 22 07/25/2024 1755   GLUCOSE 126 (H) 02/05/2024 1002   GLUCOSE 224 (H) 08/29/2023 1419   BUN 34 (A) 07/25/2024 1755   CREATININE 1.7 (A) 07/25/2024 1755   CREATININE 1.10 (H) 02/05/2024 1002   CALCIUM  9.4 07/25/2024 1755   GFRNONAA 35 (L) 08/29/2023 1419   GFRAA 59 (L) 11/07/2018 1000   Lab Results  Component Value Date   HGBA1C 6.9 07/25/2024   HGBA1C 13.4 (H) 03/04/2016   No results found for: INSULIN  Lab Results  Component  Value Date   TSH 1.210 02/05/2024   CBC    Component Value Date/Time   WBC 10.6 02/05/2024 1002   WBC 14.6 (H) 08/29/2023 1419   RBC 4.19 07/25/2024 1755   HGB 12.5 07/25/2024 1755   HGB 12.7 02/05/2024 1002   HCT 39 07/25/2024 1755   HCT 39.5 02/05/2024 1002   PLT 308 07/25/2024 1755   PLT 336 02/05/2024 1002   MCV 92 02/05/2024 1002   MCH 29.6 02/05/2024 1002   MCH 28.9 08/29/2023 1419   MCHC 32.2 02/05/2024 1002   MCHC 32.4 08/29/2023 1419   RDW 13.1 02/05/2024 1002   Iron Studies No results found for: IRON, TIBC, FERRITIN, IRONPCTSAT Lipid Panel     Component Value Date/Time   CHOL 193 02/05/2024 1002   TRIG 76 02/05/2024 1002   HDL 64 02/05/2024 1002   LDLCALC 115 (H) 02/05/2024 1002   Hepatic Function Panel     Component Value Date/Time   PROT 7.2 02/05/2024 1002   ALBUMIN 4.4 07/25/2024 1755   ALBUMIN 4.4 02/05/2024 1002   AST 19 07/25/2024 1755   ALT 20 07/25/2024 1755   ALKPHOS 101 07/25/2024  1755   BILITOT <0.2 02/05/2024 1002      Component Value Date/Time   TSH 1.210 02/05/2024 1002   Nutritional Lab Results  Component Value Date   VD25OH 56.7 08/06/2024   VD25OH 41.8 02/05/2024     Follow up:   No follow-ups on file. *** She was informed of the importance of frequent follow up visits to maximize her success with intensive lifestyle modifications for her multiple health conditions.  Lucienne Childes Lovan is aware that we will review all of her lab results at our next visit together in person.  She is aware that if anything is critical/ life threatening with the results, we will be contacting her via MyChart or by my CMA will be calling them prior to the office visit to discuss acute management.    Attestations:   I, Special Puri, acting as a stage manager for [employer], DO., have compiled all relevant documentation for today's office visit on behalf of Maria Jenkins, DO, while in the presence of [employer], DO.  Pertinent positives were addressed with patient today. Reviewed by clinician on day of visit: allergies, medications, problem list, medical history, surgical history, family history, social history, and previous encounter notes.  I have spent *** minutes in the care of the patient today including *** minutes face-to-face assessing and reviewing listed medical problems above as outlined in office visit note and providing nutritional and behavioral counseling as outlined in obesity care plan.   I have reviewed the above documentation for accuracy and completeness, and I agree with the above. Maria JINNY [name], D.O.  The 21st Century Cures Act was signed into law in 2016 which includes the topic of electronic health records.  This provides immediate access to information in MyChart. This includes consultation notes, operative notes, office notes, lab results and pathology reports.  If you have any questions about what you read please let us  know at  your next visit so we can discuss your concerns and take corrective action if need be.  We are right here with you.

## 2024-11-28 ENCOUNTER — Encounter (INDEPENDENT_AMBULATORY_CARE_PROVIDER_SITE_OTHER): Payer: Self-pay | Admitting: Family Medicine

## 2024-11-28 LAB — LIPID PANEL
Chol/HDL Ratio: 2.9 ratio (ref 0.0–4.4)
Cholesterol, Total: 193 mg/dL (ref 100–199)
HDL: 66 mg/dL (ref >39)
LDL Chol Calc (NIH): 116 mg/dL — ABNORMAL HIGH (ref 0–99)
Triglycerides: 61 mg/dL (ref 0–149)
VLDL Cholesterol Cal: 11 mg/dL (ref 5–40)

## 2024-11-28 LAB — HEMOGLOBIN A1C
Est. average glucose Bld gHb Est-mCnc: 126 mg/dL
Hgb A1c MFr Bld: 6 % — ABNORMAL HIGH (ref 4.8–5.6)

## 2025-01-01 ENCOUNTER — Ambulatory Visit (INDEPENDENT_AMBULATORY_CARE_PROVIDER_SITE_OTHER): Admitting: Family Medicine

## 2025-01-01 ENCOUNTER — Encounter (INDEPENDENT_AMBULATORY_CARE_PROVIDER_SITE_OTHER): Payer: Self-pay | Admitting: Family Medicine

## 2025-01-01 VITALS — BP 119/73 | HR 61 | Temp 98.0°F | Ht 65.0 in | Wt 159.0 lb

## 2025-01-01 DIAGNOSIS — Z6826 Body mass index (BMI) 26.0-26.9, adult: Secondary | ICD-10-CM | POA: Diagnosis not present

## 2025-01-01 DIAGNOSIS — E669 Obesity, unspecified: Secondary | ICD-10-CM

## 2025-01-01 DIAGNOSIS — Z6827 Body mass index (BMI) 27.0-27.9, adult: Secondary | ICD-10-CM

## 2025-01-01 DIAGNOSIS — Z7985 Long-term (current) use of injectable non-insulin antidiabetic drugs: Secondary | ICD-10-CM

## 2025-01-01 DIAGNOSIS — E559 Vitamin D deficiency, unspecified: Secondary | ICD-10-CM

## 2025-01-01 DIAGNOSIS — E782 Mixed hyperlipidemia: Secondary | ICD-10-CM | POA: Diagnosis not present

## 2025-01-01 DIAGNOSIS — I10 Essential (primary) hypertension: Secondary | ICD-10-CM

## 2025-01-01 DIAGNOSIS — E1169 Type 2 diabetes mellitus with other specified complication: Secondary | ICD-10-CM | POA: Diagnosis not present

## 2025-01-01 DIAGNOSIS — I152 Hypertension secondary to endocrine disorders: Secondary | ICD-10-CM

## 2025-01-01 DIAGNOSIS — E1159 Type 2 diabetes mellitus with other circulatory complications: Secondary | ICD-10-CM

## 2025-01-01 MED ORDER — TIRZEPATIDE 7.5 MG/0.5ML ~~LOC~~ SOAJ: 7.5000 mg | SUBCUTANEOUS | 0 refills | Status: AC

## 2025-01-01 NOTE — Progress Notes (Signed)
 "  Maria [name], D.O.  ABFM, ABOM Specializing in Clinical Bariatric Medicine  Office located at: [address] [place], [place]  [address]      A) FOR THE CHRONIC DISEASE OF OBESITY:  Obesity (BMI 30-39.9), Starting BMI 32.96 BMI 27.0-27.9,adult - current BMI 26.46  Chief complaint: Obesity Maria [name] is here to discuss her progress with her obesity treatment plan.   History of present illness / Interval history:  Maria [name] is here today for her follow-up office visit.  Since last OV on 11/27/2024, pt is down 3 lbs.   Pt was sick from 12/19 to christmas eve. She states she could not eat or keep her food down.    11/27/24 08:00 01/01/25 09:00   Body Fat % 40.3 % 38.3 %  Muscle Mass (lbs) 92.2 lbs 93.4 lbs  Fat Mass (lbs) 65.6 lbs 61 lbs  Total Body Water (lbs) 63.2 lbs 62.6 lbs  Visceral Fat Rating  9 9  Counseling done on how various foods will affect these numbers and how to maximize success   Total lbs lost to date: -33 lbs Total Fat Mass in lbs lost to date: -26.2 Total weight loss percentage to date: -17.19 %   Nutrition Therapy She is on She is on the Category 1 Plan and keeping a food journal and adhering to recommended goals of 1000 calories and 80+ grams protein  and states she is following her eating plan approximately 90 % of the time.   - Tracking Calories/Macros: no  - Eating More Whole Foods: no  - Adequate Protein Intake: no  - Adequate Water Intake: yes  - Skipping Meals: no  - Sleeping 7-9 Hours/ Night: yes   Maria [name] is currently in the action stage of change. As such, her goal is to continue weight management plan.  She has agreed to: continue current plan   Physical Activity Pt is not exercising due to being sick   Maria [name] has been advised to work up to 300-450 minutes of moderate intensity aerobic activity a week and strengthening exercises 2-3 times per week for cardiovascular health, weight loss maintenance and  preservation of muscle mass.  She has agreed to : Increase physical activity in their day and reduce sedentary time (increase NEAT)., Increase volume of physical activity to a goal of 240 minutes a week, and Combine aerobic and strengthening exercises for efficiency and improved cardiometabolic health.   Behavioral Modifications Evidence-based interventions for health behavior change were utilized today including the discussion of  1) self monitoring techniques:  monitoring BP 2) problem-solving barriers:  has been sick 3) self care:  exercise 4) SMART goals for next OV:  none Regarding patient's less desirable eating habits and patterns, we employed the technique of small changes.   We discussed the following today: continue to work on implementation of reduced calorie nutritional plan and continue to practice mindfulness when eating Additional resources provided today: None   Medical Interventions/ Pharmacotherapy Previous Bariatric surgery: none Pharmacotherapy for weight loss: She is currently taking Mounjaro  7.5 mg once weekly for medical weight loss.    We discussed various medication options to help Maria [name] with her weight loss efforts and we both agreed to : Continue with current nutritional and behavioral strategies   B) OBESITY RELATED CONDITIONS ADDRESSED TODAY:  Type 2 diabetes mellitus in patient with obesity [facility]) Assessment & Plan Lab Results  Component Value Date   HGBA1C 6.0 (H) 11/27/2024   HGBA1C 6.9 07/25/2024  HGBA1C 8.2 (H) 02/05/2024  Currently on Mounjaro  7.5 mg weekly with good adherence and tolerance. Pt reports she was sick from 12/19-12/24, and was unable to keep food down.Cont Mounjaro  at same dose(refill today). Cont PNP and increase exercise as able.     Mixed diabetic hyperlipidemia associated with type 2 diabetes mellitus [facility]) Assessment & Plan Lab Results  Component Value Date   CHOL 193 11/27/2024   HDL 66 11/27/2024   LDLCALC 116 (H)  11/27/2024   TRIG 61 11/27/2024   CHOLHDL 2.9 11/27/2024  Currently takes Crestor 5 mg daily with good compliance and tolerance. HDL has improved. Trigs are WNL. LDL has worsened from 115 to 116 despite healthy dietary and lifestyle changes she has made. Because pt is compliant with medication and LDL cholesterol levels have not improved, encouraged her to ask PCP to increase dose of medication. Cont to decrease saturated and trans fats. Increase exercise as able.    Hypertension associated with diabetes ([facility]CC) Assessment & Plan Last 3 blood pressure readings in our office are as follows: BP Readings from Last 3 Encounters:  01/01/25 119/73  11/27/24 132/84  10/02/24 97/63   The 10-year ASCVD risk score (Arnett DK, et al., 2019) is: 22.6%  Lab Results  Component Value Date   CREATININE 1.5 (A) 08/29/2024  Currently on caridzem 360 mg once daily with good compliance and tolerance. BP is well controlled today. Pt asx. No acute concerns. Creatinine is elevated at 1.5; optimal <1.0. Cont regimen. Cont PNP and adequate water intake. F/u with PCP as needed.      Vitamin D  deficiency Assessment & Plan Lab Results  Component Value Date   VD25OH 56.7 08/06/2024   VD25OH 41.8 02/05/2024  Currently on OTC Vitamin D3 2,000 units once daily with good compliance and tolerance. Vit D levels are at goal. Cont regimen. Will recheck as necessary.       Medications Discontinued During This Encounter  Medication Reason   tirzepatide  (MOUNJARO ) 7.5 MG/0.5ML Pen Reorder     Meds ordered this encounter  Medications   tirzepatide  (MOUNJARO ) 7.5 MG/0.5ML Pen    Sig: Inject 7.5 mg into the skin once a week.    Dispense:  2 mL    Refill:  0    Please fill once her 5mg  script is complete.      Follow up:   Return 01/29/2025 9:40 AM.  She was informed of the importance of frequent follow up visits to maximize her success with intensive lifestyle modifications for her multiple health  conditions.   Weight Summary and Biometrics   Weight Lost Since Last Visit: 3lb  Weight Gained Since Last Visit: 0lb    Vitals Temp: 98 F (36.7 C) BP: 119/73 Pulse Rate: 61 SpO2: 97 %   Anthropometric Measurements Height: 5' 5 (1.651 m) Weight: 159 lb (72.1 kg) BMI (Calculated): 26.46 Weight at Last Visit: 162lb Weight Lost Since Last Visit: 3lb Weight Gained Since Last Visit: 0lb Starting Weight: 192lb Total Weight Loss (lbs): 33 lb (15 kg) Peak Weight: 192lb   Body Composition  Body Fat %: 38.3 % Fat Mass (lbs): 61 lbs Muscle Mass (lbs): 93.4 lbs Total Body Water (lbs): 62.6 lbs Visceral Fat Rating : 9   Other Clinical Data Fasting: yes Labs: no Today's Visit #: 12 Starting Date: 02/05/24    Objective:   PHYSICAL EXAM: Blood pressure 119/73, pulse 61, temperature 98 F (36.7 C), height 5' 5 (1.651 m), weight 159 lb (72.1 kg), last menstrual period  05/21/2013, SpO2 97%. Body mass index is 26.46 kg/m.  General: she is overweight, cooperative and in no acute distress. PSYCH: Has normal mood, affect and thought process.   HEENT: EOMI, sclerae are anicteric. Lungs: Normal breathing effort, no conversational dyspnea. Extremities: Moves * 4 Neurologic: A and O * 3, good insight  DIAGNOSTIC DATA REVIEWED: BMET    Component Value Date/Time   NA 141 08/29/2024 0000   K 4.5 08/29/2024 0000   CL 107 08/29/2024 0000   CO2 18 08/29/2024 0000   GLUCOSE 126 (H) 02/05/2024 1002   GLUCOSE 224 (H) 08/29/2023 1419   BUN 34 (A) 08/29/2024 0000   CREATININE 1.5 (A) 08/29/2024 0000   CREATININE 1.10 (H) 02/05/2024 1002   CALCIUM  9.7 08/29/2024 0000   GFRNONAA 35 (L) 08/29/2023 1419   GFRAA 59 (L) 11/07/2018 1000   Lab Results  Component Value Date   HGBA1C 6.0 (H) 11/27/2024   HGBA1C 13.4 (H) 03/04/2016   No results found for: INSULIN  Lab Results  Component Value Date   TSH 1.210 02/05/2024   CBC    Component Value Date/Time   WBC 10.2  08/29/2024 0000   WBC 14.6 (H) 08/29/2023 1419   RBC 4.34 08/29/2024 0000   HGB 13.1 08/29/2024 0000   HGB 12.7 02/05/2024 1002   HCT 40 08/29/2024 0000   HCT 39.5 02/05/2024 1002   PLT 348 08/29/2024 0000   PLT 336 02/05/2024 1002   MCV 92 02/05/2024 1002   MCH 29.6 02/05/2024 1002   MCH 28.9 08/29/2023 1419   MCHC 32.2 02/05/2024 1002   MCHC 32.4 08/29/2023 1419   RDW 13.1 02/05/2024 1002   Iron Studies No results found for: IRON, TIBC, FERRITIN, IRONPCTSAT Lipid Panel     Component Value Date/Time   CHOL 193 11/27/2024 0948   TRIG 61 11/27/2024 0948   HDL 66 11/27/2024 0948   CHOLHDL 2.9 11/27/2024 0948   LDLCALC 116 (H) 11/27/2024 0948   Hepatic Function Panel     Component Value Date/Time   PROT 7.2 02/05/2024 1002   ALBUMIN 4.4 08/29/2024 0000   ALBUMIN 4.4 02/05/2024 1002   AST 15 08/29/2024 0000   ALT 14 08/29/2024 0000   ALKPHOS 116 08/29/2024 0000   BILITOT <0.2 02/05/2024 1002      Component Value Date/Time   TSH 1.210 02/05/2024 1002   Nutritional Lab Results  Component Value Date   VD25OH 56.7 08/06/2024   VD25OH 41.8 02/05/2024    Attestations:   LILLETTE Feliciano Mingle, acting as a stage manager for [employer], DO., have compiled all relevant documentation for today's office visit on behalf of Maria Jenkins, DO, while in the presence of [employer], DO.   I have reviewed the above documentation for accuracy and completeness, and I agree with the above. Maria JINNY [name], D.O.  The 21st Century Cures Act was signed into law in 2016 which includes the topic of electronic health records.  This provides immediate access to information in MyChart.  This includes consultation notes, operative notes, office notes, lab results and pathology reports.  If you have any questions about what you read please let us  know at your next visit so we can discuss your concerns and take corrective action if need be.  We are right here with you.  "

## 2025-01-01 NOTE — Patient Instructions (Signed)
 The 10-year ASCVD risk score (Arnett DK, et al., 2019) is: 22.6%   Values used to calculate the score:     Age: 64 years     Clinically relevant sex: Female     Is Non-Hispanic African American: Yes     Diabetic: Yes     Tobacco smoker: Yes     Systolic Blood Pressure: 119 mmHg     Is BP treated: Yes     HDL Cholesterol: 66 mg/dL     Total Cholesterol: 193 mg/dL

## 2025-01-29 ENCOUNTER — Ambulatory Visit (INDEPENDENT_AMBULATORY_CARE_PROVIDER_SITE_OTHER): Admitting: Family Medicine

## 2025-01-30 ENCOUNTER — Ambulatory Visit (INDEPENDENT_AMBULATORY_CARE_PROVIDER_SITE_OTHER): Admitting: Family Medicine

## 2025-02-11 ENCOUNTER — Ambulatory Visit (INDEPENDENT_AMBULATORY_CARE_PROVIDER_SITE_OTHER): Admitting: Family Medicine

## 2025-02-27 ENCOUNTER — Ambulatory Visit (INDEPENDENT_AMBULATORY_CARE_PROVIDER_SITE_OTHER): Admitting: Family Medicine
